# Patient Record
Sex: Female | Born: 1945 | Race: White | Hispanic: No | Smoking: Current every day smoker
Health system: Southern US, Community
[De-identification: ages and names within clinical notes are randomized; demographics above are authoritative.]

## PROBLEM LIST (undated history)

## (undated) DIAGNOSIS — I714 Abdominal aortic aneurysm, without rupture, unspecified: Secondary | ICD-10-CM

## (undated) DIAGNOSIS — E785 Hyperlipidemia, unspecified: Secondary | ICD-10-CM

## (undated) DIAGNOSIS — J439 Emphysema, unspecified: Secondary | ICD-10-CM

## (undated) DIAGNOSIS — G43909 Migraine, unspecified, not intractable, without status migrainosus: Secondary | ICD-10-CM

## (undated) DIAGNOSIS — I2510 Atherosclerotic heart disease of native coronary artery without angina pectoris: Secondary | ICD-10-CM

## (undated) DIAGNOSIS — I10 Essential (primary) hypertension: Secondary | ICD-10-CM

## (undated) HISTORY — DX: Emphysema, unspecified: J43.9

## (undated) HISTORY — DX: Hyperlipidemia, unspecified: E78.5

## (undated) HISTORY — PX: EXTERNAL EAR SURGERY: SHX627

## (undated) HISTORY — PX: TYMPANOSTOMY TUBE PLACEMENT: SHX32

## (undated) HISTORY — PX: UTERINE FIBROID SURGERY: SHX826

## (undated) HISTORY — DX: Atherosclerotic heart disease of native coronary artery without angina pectoris: I25.10

---

## 2002-07-24 DIAGNOSIS — E782 Mixed hyperlipidemia: Secondary | ICD-10-CM | POA: Diagnosis present

## 2002-07-24 DIAGNOSIS — I10 Essential (primary) hypertension: Secondary | ICD-10-CM | POA: Diagnosis present

## 2010-08-19 DIAGNOSIS — K648 Other hemorrhoids: Secondary | ICD-10-CM | POA: Insufficient documentation

## 2010-08-19 DIAGNOSIS — K644 Residual hemorrhoidal skin tags: Secondary | ICD-10-CM | POA: Insufficient documentation

## 2010-11-30 DIAGNOSIS — L509 Urticaria, unspecified: Secondary | ICD-10-CM | POA: Insufficient documentation

## 2011-08-17 DIAGNOSIS — G43909 Migraine, unspecified, not intractable, without status migrainosus: Secondary | ICD-10-CM | POA: Insufficient documentation

## 2017-11-30 DIAGNOSIS — I739 Peripheral vascular disease, unspecified: Secondary | ICD-10-CM | POA: Insufficient documentation

## 2018-03-29 ENCOUNTER — Encounter: Payer: Self-pay | Admitting: Emergency Medicine

## 2018-03-29 ENCOUNTER — Emergency Department: Payer: Medicare Other

## 2018-03-29 ENCOUNTER — Other Ambulatory Visit: Payer: Self-pay

## 2018-03-29 ENCOUNTER — Emergency Department
Admission: EM | Admit: 2018-03-29 | Discharge: 2018-03-29 | Disposition: A | Discharge status: Other Acute Inpatient Hospital | Payer: Medicare Other | Attending: Emergency Medicine | Admitting: Emergency Medicine

## 2018-03-29 DIAGNOSIS — I10 Essential (primary) hypertension: Secondary | ICD-10-CM | POA: Diagnosis not present

## 2018-03-29 DIAGNOSIS — R0789 Other chest pain: Secondary | ICD-10-CM | POA: Diagnosis present

## 2018-03-29 DIAGNOSIS — I7101 Dissection of thoracic aorta: Secondary | ICD-10-CM | POA: Diagnosis not present

## 2018-03-29 DIAGNOSIS — I71019 Dissection of thoracic aorta, unspecified: Secondary | ICD-10-CM

## 2018-03-29 DIAGNOSIS — T148XXA Other injury of unspecified body region, initial encounter: Secondary | ICD-10-CM | POA: Insufficient documentation

## 2018-03-29 HISTORY — DX: Migraine, unspecified, not intractable, without status migrainosus: G43.909

## 2018-03-29 HISTORY — DX: Essential (primary) hypertension: I10

## 2018-03-29 LAB — APTT: aPTT: 39 seconds — ABNORMAL HIGH (ref 24–36)

## 2018-03-29 LAB — CBC
HCT: 44.9 % (ref 36.0–46.0)
HEMOGLOBIN: 14.6 g/dL (ref 12.0–15.0)
MCH: 31 pg (ref 26.0–34.0)
MCHC: 32.5 g/dL (ref 30.0–36.0)
MCV: 95.3 fL (ref 80.0–100.0)
Platelets: 262 10*3/uL (ref 150–400)
RBC: 4.71 MIL/uL (ref 3.87–5.11)
RDW: 13.2 % (ref 11.5–15.5)
WBC: 12.1 10*3/uL — ABNORMAL HIGH (ref 4.0–10.5)
nRBC: 0 % (ref 0.0–0.2)

## 2018-03-29 LAB — POCT I-STAT, CHEM 8
BUN: 28 mg/dL — ABNORMAL HIGH (ref 8–23)
CALCIUM ION: 1.18 mmol/L (ref 1.15–1.40)
Chloride: 104 mmol/L (ref 98–111)
Creatinine, Ser: 0.8 mg/dL (ref 0.44–1.00)
Glucose, Bld: 123 mg/dL — ABNORMAL HIGH (ref 70–99)
HCT: 42 % (ref 36.0–46.0)
Hemoglobin: 14.3 g/dL (ref 12.0–15.0)
Potassium: 3.6 mmol/L (ref 3.5–5.1)
SODIUM: 139 mmol/L (ref 135–145)
TCO2: 31 mmol/L (ref 22–32)

## 2018-03-29 LAB — PROTIME-INR
INR: 0.94
Prothrombin Time: 12.5 seconds (ref 11.4–15.2)

## 2018-03-29 LAB — TROPONIN I

## 2018-03-29 MED ORDER — OXYCODONE HCL 5 MG PO TABS
5.00 | ORAL_TABLET | ORAL | Status: DC
Start: ? — End: 2018-03-29

## 2018-03-29 MED ORDER — OXYCODONE HCL 5 MG PO TABS
10.00 | ORAL_TABLET | ORAL | Status: DC
Start: ? — End: 2018-03-29

## 2018-03-29 MED ORDER — MORPHINE SULFATE (PF) 4 MG/ML IV SOLN
INTRAVENOUS | Status: AC
  Administered 2018-03-29: 4 mg via INTRAVENOUS
  Filled 2018-03-29: qty 1

## 2018-03-29 MED ORDER — MORPHINE SULFATE (PF) 4 MG/ML IV SOLN
4.0000 mg | Freq: Once | INTRAVENOUS | Status: AC
  Administered 2018-03-29: 4 mg via INTRAVENOUS

## 2018-03-29 MED ORDER — NITROPRUSSIDE SODIUM-NACL 10-0.9 MG/50ML-% IV SOLN
0.0000 ug/kg/min | INTRAVENOUS | Status: DC
  Administered 2018-03-29: 0.3 ug/kg/min via INTRAVENOUS
  Filled 2018-03-29: qty 50

## 2018-03-29 MED ORDER — FENTANYL CITRATE (PF) 50 MCG/ML IJ SOLN
50.00 | INTRAMUSCULAR | Status: DC
Start: ? — End: 2018-03-29

## 2018-03-29 MED ORDER — IOHEXOL 350 MG/ML SOLN
75.0000 mL | Freq: Once | INTRAVENOUS | Status: AC | PRN
  Administered 2018-03-29: 75 mL via INTRAVENOUS

## 2018-03-29 MED ORDER — ONDANSETRON HCL 4 MG/2ML IJ SOLN
4.00 | INTRAMUSCULAR | Status: DC
Start: ? — End: 2018-03-29

## 2018-03-29 MED ORDER — DOCUSATE SODIUM 100 MG PO CAPS
100.00 | ORAL_CAPSULE | ORAL | Status: DC
Start: 2018-03-30 — End: 2018-03-29

## 2018-03-29 MED ORDER — ACETAMINOPHEN 325 MG PO TABS
650.00 | ORAL_TABLET | ORAL | Status: DC
Start: ? — End: 2018-03-29

## 2018-03-29 MED ORDER — ONDANSETRON HCL 4 MG/2ML IJ SOLN
INTRAMUSCULAR | Status: AC
  Administered 2018-03-29: 4 mg via INTRAVENOUS
  Filled 2018-03-29: qty 2

## 2018-03-29 MED ORDER — ONDANSETRON HCL 4 MG/2ML IJ SOLN
4.0000 mg | Freq: Once | INTRAMUSCULAR | Status: AC
  Administered 2018-03-29: 4 mg via INTRAVENOUS

## 2018-03-29 MED ORDER — CLEVIDIPINE 25 MG/50ML IV EMUL
0.00 | INTRAVENOUS | Status: DC
Start: ? — End: 2018-03-29

## 2018-03-29 MED ORDER — MORPHINE SULFATE (PF) 4 MG/ML IV SOLN
INTRAVENOUS | Status: AC
  Filled 2018-03-29: qty 1

## 2018-03-29 MED ORDER — ESMOLOL HCL-SODIUM CHLORIDE 2500 MG/250ML IV SOLN
0.00 | INTRAVENOUS | Status: DC
Start: ? — End: 2018-03-29

## 2018-03-29 NOTE — ED Notes (Signed)
Rate dose change of nipride, per parameters due to a blood pressure of 154/78

## 2018-03-29 NOTE — ED Notes (Signed)
[facility] transfer center called to given report to next provider

## 2018-03-29 NOTE — ED Notes (Signed)
Pt signs consent for transfer electronically.

## 2018-03-29 NOTE — ED Notes (Signed)
Rate/dose change of nipride, per parameter due to blood pressure of 154/74

## 2018-03-29 NOTE — ED Notes (Signed)
Rate dose change of nipride due to blood pressure of 163/77

## 2018-03-29 NOTE — ED Notes (Signed)
Rate dose change of nipride, per parameter due to blood pressure of 144/79

## 2018-03-29 NOTE — ED Triage Notes (Signed)
PT arrived via ems from home with complaints of sudden onset of central chest pain that radiates to her back. Pt rates pain at a 5 but appears very uncomfortable in triage. Pt has HX of HTN and took her prescribed HTN medication 30 minutes prior to calling ems. Pt hypertensive in triage 234/97. Pt describes the pain as a constant grinding pain that is increasing with intensity.   Pt was given a full dosage of aspirin prior to arrival.

## 2018-03-29 NOTE — ED Notes (Signed)
EMTALA reviewed. 

## 2018-03-29 NOTE — ED Notes (Addendum)
Rate/dose change of nipride, per MAR parameters. Blood pressure 181/84

## 2018-03-29 NOTE — ED Notes (Signed)
Rate/dose change of nipride, per parameters due to blood pressure of 138/79

## 2018-03-29 NOTE — ED Notes (Signed)
Rate/dose change of nipride, per parameters due to blood pressure of 156/72

## 2018-03-29 NOTE — ED Notes (Signed)
Rate dose change of nipride, per parameter due to blood pressure of 171/81

## 2018-03-29 NOTE — ED Notes (Signed)
Rate dose change of nipride, per parameter due to blood pressure of 169/71

## 2018-03-29 NOTE — ED Notes (Signed)
Rate/dose change of nipride, per parameter due to blood pressure of 164/80

## 2018-03-29 NOTE — ED Notes (Signed)
Rate/dose change of nipride, per parameter due to blood pressure of 161/77

## 2018-03-29 NOTE — ED Notes (Signed)
RN from accepting facility unable to take report at this time. Call back number provided.

## 2018-03-29 NOTE — ED Notes (Signed)
Rate dose change of nipride, per parameter due to blood pressure of 150/68

## 2018-03-29 NOTE — ED Notes (Signed)
[facility] transfer team resumes care and management of nipride at this time

## 2018-03-29 NOTE — ED Notes (Signed)
Rate dose change of nipride, per parameter due to blood pressure of 132/60

## 2018-03-29 NOTE — ED Notes (Signed)
Rate/dose change of nipride, per MAR parameters due to blood pressure of 179/75

## 2018-03-29 NOTE — ED Provider Notes (Signed)
[facility] Emergency Department Provider Note   ____________________________________________    I have reviewed the triage vital signs and the nursing notes.   HISTORY  Chief Complaint Chest Pain     HPI Tiffany Munoz is a 72 y.o. female who presents with relatively abrupt onset of chest pain radiating to her back.  The chest pain is substernal with radiation directly posterior.  She reports it started at 4 AM after she got into bed.  She reports she took aspirin antacids without significant relief although it did seem to improve gradually but then started to get worse.  She called EMS around 815.  Denies nausea vomiting or diaphoresis.  No history of heart disease does have a history of hypertension which is controlled with medications.  No fevers or chills or cough.  No pleurisy.  No recent travel.  No calf pain or swelling.  Past Medical History:  Diagnosis Date  . Hypertension   . Migraine     There are no active problems to display for this patient.     Prior to Admission medications   Not on File     Allergies Wellbutrin [bupropion] No family history of dissection Social History No smoking, occasional alcohol  Review of Systems  Constitutional: No fever/chills Eyes: No visual changes.  ENT: No neck pain Cardiovascular: As above Respiratory: No significant shortness of breath Gastrointestinal: No abdominal pain.  No nausea, no vomiting.   Genitourinary: No incontinence Musculoskeletal: As above, radiation to the back Skin: Negative for rash. Neurological: Negative for headaches   ____________________________________________   PHYSICAL EXAM:  VITAL SIGNS: ED Triage Vitals  Enc Vitals Group     BP 03/29/18 0837 (!) 234/97     Pulse Rate 03/29/18 0837 (!) 51     Resp 03/29/18 0837 15     Temp 03/29/18 0837 97.8 F (36.6 C)     Temp Source 03/29/18 0837 Oral     SpO2 03/29/18 0837 100 %     Weight 03/29/18 0838 48.5  kg (107 lb)     Height 03/29/18 0838 1.651 m (5\' 5" )     Head Circumference --      Peak Flow --      Pain Score 03/29/18 0837 5     Pain Loc --      Pain Edu? --      Excl. in GC? --     Constitutional: Alert and oriented.  Uncomfortable appearing but no acute distress Eyes: Conjunctivae are normal.   Nose: No congestion/rhinnorhea. Mouth/Throat: Mucous membranes are moist.    Cardiovascular: Mild bradycardia, regular rhythm. Grossly normal heart sounds.  Good peripheral circulation. Respiratory: Normal respiratory effort.  No retractions. Lungs CTAB. Gastrointestinal: Soft and nontender. No distention.  No pulsatile mass  Musculoskeletal: No calf pain or swelling.  No edema.  Warm and well perfused Neurologic:  Normal speech and language. No gross focal neurologic deficits are appreciated.  Skin:  Skin is warm, dry and intact. No rash noted. Psychiatric: Mood and affect are normal. Speech and behavior are normal.  ____________________________________________   LABS (all labs ordered are listed, but only abnormal results are displayed)  Labs Reviewed  CBC - Abnormal; Notable for the following components:      Result Value   WBC 12.1 (*)    All other components within normal limits  APTT - Abnormal; Notable for the following components:   aPTT 39 (*)    All other components within normal  limits  POCT I-STAT, CHEM 8 - Abnormal; Notable for the following components:   BUN 28 (*)    Glucose, Bld 123 (*)    All other components within normal limits  TROPONIN I  PROTIME-INR  I-STAT CHEM 8, ED   ____________________________________________  EKG  ED ECG REPORT I, Jene Everyobert Curt Oatis, the attending physician, personally viewed and interpreted this ECG.  Date: 03/29/2018 EKG Time: 8:37 AM Rate: 53 Rhythm: normal sinus rhythm QRS Axis: normal Intervals: normal ST/T Wave abnormalities: Minimal ST depression laterally Narrative Interpretation: Possible ST depression  laterally  ____________________________________________  RADIOLOGY  CT angiography demonstrates acute intramural hematoma ____________________________________________   PROCEDURES  Procedure(s) performed: No  Procedures   Critical Care performed: yes  CRITICAL CARE Performed by: Jene Everyobert Madalyn Legner   Total critical care time: 50 minutes  Critical care time was exclusive of separately billable procedures and treating other patients.  Critical care was necessary to treat or prevent imminent or life-threatening deterioration.  Critical care was time spent personally by me on the following activities: development of treatment plan with patient and/or surrogate as well as nursing, discussions with consultants, evaluation of patient's response to treatment, examination of patient, obtaining history from patient or surrogate, ordering and performing treatments and interventions, ordering and review of laboratory studies, ordering and review of radiographic studies, pulse oximetry and re-evaluation of patient's condition.  ____________________________________________   INITIAL IMPRESSION / ASSESSMENT AND PLAN / ED COURSE  Pertinent labs & imaging results that were available during my care of the patient were reviewed by me and considered in my medical decision making (see chart for details).  Patient presents with relatively abrupt onset of chest pain at 4 AM, radiating to her back which is moderate to severe.  She is markedly hypertensive here in the emergency department.  Significant concern for dissection versus CAD, no pleurisy or cough or travel or calf pain to suggest PE.  EKG overall reassuring however possible minimal ST depression laterally will repeat in 15 to 20 minutes.  Will obtain i-STAT Chem-8 to expedite CT scan.   ----------------------------------------- 9:22 AM on 03/29/2018 -----------------------------------------  CT scan is in progress, on initial nonenhanced  images very suspicious for dissection.  I have ordered second IV, nitroprusside drip  Discussed with radiology who confirms acute intramural hematoma.  ----------------------------------------- 9:43 AM on 03/29/2018 -----------------------------------------  [facility] patient accepted by Dr. Karma GreaserParody.  Questioned specialist whether to add esmolol given that the patient's heart rate is in the low 60s and was told "I will let you make medical decisions at your facility ".  Given that response I will hold off on esmolol unless we see an increase in her heart rate.  Attempting to find the fastest route of transfer for this patient which may require helicopter transfer.    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    Jene EveryKinner, Jojuan Champney, MD 03/29/18 1135

## 2018-03-29 NOTE — ED Notes (Signed)
Rate dose change of nipride per parameters due to blood pressure

## 2018-03-30 MED ORDER — HYDRALAZINE HCL 25 MG PO TABS
50.00 | ORAL_TABLET | ORAL | Status: DC
Start: 2018-03-30 — End: 2018-03-30

## 2018-03-30 MED ORDER — METOPROLOL SUCCINATE ER 50 MG PO TB24
50.00 | ORAL_TABLET | ORAL | Status: DC
Start: 2018-03-31 — End: 2018-03-30

## 2018-03-30 MED ORDER — HYDRALAZINE HCL 20 MG/ML IJ SOLN
10.00 | INTRAMUSCULAR | Status: DC
Start: ? — End: 2018-03-30

## 2018-08-24 DIAGNOSIS — I714 Abdominal aortic aneurysm, without rupture, unspecified: Secondary | ICD-10-CM | POA: Insufficient documentation

## 2018-09-10 ENCOUNTER — Emergency Department
Admission: EM | Admit: 2018-09-10 | Discharge: 2018-09-10 | Disposition: A | Discharge status: Home / Self Care | Payer: Medicare Other | Attending: Emergency Medicine | Admitting: Emergency Medicine

## 2018-09-10 ENCOUNTER — Encounter: Payer: Self-pay | Admitting: Emergency Medicine

## 2018-09-10 ENCOUNTER — Other Ambulatory Visit: Payer: Self-pay

## 2018-09-10 DIAGNOSIS — Z7902 Long term (current) use of antithrombotics/antiplatelets: Secondary | ICD-10-CM | POA: Insufficient documentation

## 2018-09-10 DIAGNOSIS — I10 Essential (primary) hypertension: Secondary | ICD-10-CM | POA: Diagnosis not present

## 2018-09-10 DIAGNOSIS — R55 Syncope and collapse: Secondary | ICD-10-CM | POA: Diagnosis not present

## 2018-09-10 DIAGNOSIS — F17200 Nicotine dependence, unspecified, uncomplicated: Secondary | ICD-10-CM | POA: Diagnosis not present

## 2018-09-10 DIAGNOSIS — Z79899 Other long term (current) drug therapy: Secondary | ICD-10-CM | POA: Diagnosis not present

## 2018-09-10 HISTORY — DX: Abdominal aortic aneurysm, without rupture, unspecified: I71.40

## 2018-09-10 HISTORY — DX: Abdominal aortic aneurysm, without rupture: I71.4

## 2018-09-10 LAB — CBC
HCT: 44 % (ref 36.0–46.0)
Hemoglobin: 14.2 g/dL (ref 12.0–15.0)
MCH: 30.8 pg (ref 26.0–34.0)
MCHC: 32.3 g/dL (ref 30.0–36.0)
MCV: 95.4 fL (ref 80.0–100.0)
Platelets: 291 10*3/uL (ref 150–400)
RBC: 4.61 MIL/uL (ref 3.87–5.11)
RDW: 13 % (ref 11.5–15.5)
WBC: 9.2 10*3/uL (ref 4.0–10.5)
nRBC: 0 % (ref 0.0–0.2)

## 2018-09-10 LAB — URINALYSIS, COMPLETE (UACMP) WITH MICROSCOPIC
Bacteria, UA: NONE SEEN
Bilirubin Urine: NEGATIVE
Glucose, UA: NEGATIVE mg/dL
Hgb urine dipstick: NEGATIVE
Ketones, ur: NEGATIVE mg/dL
Leukocytes,Ua: NEGATIVE
Nitrite: NEGATIVE
Protein, ur: NEGATIVE mg/dL
Specific Gravity, Urine: 1.013 (ref 1.005–1.030)
Squamous Epithelial / HPF: NONE SEEN (ref 0–5)
pH: 5 (ref 5.0–8.0)

## 2018-09-10 LAB — COMPREHENSIVE METABOLIC PANEL
ALT: 24 U/L (ref 0–44)
AST: 23 U/L (ref 15–41)
Albumin: 4.5 g/dL (ref 3.5–5.0)
Alkaline Phosphatase: 144 U/L — ABNORMAL HIGH (ref 38–126)
Anion gap: 9 (ref 5–15)
BUN: 22 mg/dL (ref 8–23)
CO2: 26 mmol/L (ref 22–32)
Calcium: 9.9 mg/dL (ref 8.9–10.3)
Chloride: 105 mmol/L (ref 98–111)
Creatinine, Ser: 0.84 mg/dL (ref 0.44–1.00)
GFR calc Af Amer: >60 mL/min (ref >60)
GFR calc non Af Amer: >60 mL/min (ref >60)
Glucose, Bld: 91 mg/dL (ref 70–99)
Potassium: 4 mmol/L (ref 3.5–5.1)
Sodium: 140 mmol/L (ref 135–145)
Total Bilirubin: 0.5 mg/dL (ref 0.3–1.2)
Total Protein: 8 g/dL (ref 6.5–8.1)

## 2018-09-10 LAB — TROPONIN I: Troponin I: <0.03 ng/mL (ref <0.03)

## 2018-09-10 MED ORDER — SODIUM CHLORIDE 0.9% FLUSH: 3.0000 mL | Freq: Once | INTRAVENOUS | Status: DC

## 2018-09-10 NOTE — ED Notes (Signed)
Pt presents after 2+ episodes of hypotension which resolved PTA. Pt states she was outside doing light chores and became hot, sweaty, and dizzy. Pt no longer has dizziness and her BP is WNL.

## 2018-09-10 NOTE — ED Provider Notes (Signed)
[facility] Emergency Department Provider Note ____________________________________________   First MD Initiated Contact with Patient 09/10/18 1750     (approximate)  I have reviewed the triage vital signs and the nursing notes.   HISTORY  Chief Complaint Dizziness    HPI Tiffany Munoz is a 73 y.o. female with PMH as noted below who presents with lightheadedness and near syncope, acute onset today while she was doing light chores outside, and now resolved.  The patient states that she felt hot, became diaphoretic, and felt lightheaded.  However, she sat down and did not pass out.  She states that she checked her blood pressure and the systolic was 82.  Over approximately the next hour, her symptoms gradually resolved and the blood pressure gradually went back up to normal.  The patient reports that she takes metoprolol and has been compliant with it.  She denies any chest pain, abdominal pain, difficulty breathing, fever, weakness, or any vomiting.  She states that she feels well now.  Past Medical History:  Diagnosis Date  . AAA (abdominal aortic aneurysm) (HCC)   . Hypertension   . Migraine     There are no active problems to display for this patient.   Past Surgical History:  Procedure Laterality Date  . CESAREAN SECTION    . EXTERNAL EAR SURGERY    . TYMPANOSTOMY TUBE PLACEMENT    . UTERINE FIBROID SURGERY      Prior to Admission medications   Medication Sig Start Date End Date Taking? Authorizing Provider  aspirin 81 MG tablet Take 1-2 tablets by mouth daily. 09/19/07   [provider]  cetirizine (ZYRTEC ALLERGY) 10 MG tablet Take 1 tablet by mouth daily. 09/19/07   [provider]  Cholecalciferol (VITAMIN D-1000 MAX ST) 25 MCG (1000 UT) tablet Take 1 tablet by mouth daily.    [provider]  clobetasol cream (TEMOVATE) 0.05 % Apply 1 application topically 2 (two) times daily as needed. 11/30/17 11/30/18  [provider]  clopidogrel (PLAVIX) 75 MG tablet Take 75 mg by mouth daily.    [provider]  ferrous sulfate 325 (65 FE) MG tablet Take 1 tablet by mouth daily.    [provider]  Lactobacillus Rhamnosus, GG, (CULTURELLE) CAPS Take 1 capsule by mouth daily.    [provider]  losartan (COZAAR) 100 MG tablet Take 1 tablet by mouth daily. 10/04/11   [provider]  Melatonin 3 MG TABS Take 3 mg by mouth at bedtime.    [provider]  metoprolol succinate (TOPROL-XL) 50 MG 24 hr tablet Take 1 tablet by mouth daily. 05/30/17   [provider]  potassium chloride (K-DUR,KLOR-CON) 10 MEQ tablet Take 1 tablet by mouth daily. 10/18/17   [provider]  progesterone (PROMETRIUM) 100 MG capsule Take 1 capsule by mouth as needed. 10/04/11   [provider]  rosuvastatin (CRESTOR) 10 MG tablet Take 1 tablet by mouth daily. 01/04/18   [provider]  sodium fluoride (PREVIDENT) 1.1 % GEL dental gel Take 1 application by mouth daily. 05/17/10   [provider]  Testosterone 75 MG PLLT Take 1 tablet by mouth as needed. 03/22/11   [provider]  triamcinolone cream (KENALOG) 0.5 % Apply 1 application topically as needed. 08/17/16   [provider]  vitamin B-12 (CYANOCOBALAMIN) 100 MCG tablet Take 1 tablet by mouth daily.    [provider]    Allergies Wellbutrin [bupropion]  No family  history on file.  Social History Social History   Tobacco Use  . Smoking status: Current Every Day Smoker  . Smokeless tobacco: Never Used  Substance Use Topics  . Alcohol use: Yes  . Drug use: Never    Review of Systems  Constitutional: No fever. Eyes: No redness. ENT: No sore throat. Cardiovascular: Denies chest pain. Respiratory: Denies shortness of breath. Gastrointestinal: No vomiting or diarrhea.  Genitourinary: Negative for dysuria.  Musculoskeletal: Negative for back pain. Skin:  Negative for rash. Neurological: Negative for headache.   ____________________________________________   PHYSICAL EXAM:  VITAL SIGNS: ED Triage Vitals  Enc Vitals Group     BP 09/10/18 1722 119/63     Pulse Rate 09/10/18 1722 71     Resp 09/10/18 1722 18     Temp 09/10/18 1722 98.7 F (37.1 C)     Temp Source 09/10/18 1722 Oral     SpO2 09/10/18 1722 100 %     Weight 09/10/18 1722 106 lb 5 oz (48.2 kg)     Height 09/10/18 1722 5\' 5"  (1.651 m)     Head Circumference --      Peak Flow --      Pain Score 09/10/18 1727 0     Pain Loc --      Pain Edu? --      Excl. in GC? --     Constitutional: Alert and oriented. Well appearing and in no acute distress. Eyes: Conjunctivae are normal.  EOMI.  PERRLA.   Head: Atraumatic. Nose: No congestion/rhinnorhea. Mouth/Throat: Mucous membranes are moist.   Neck: Normal range of motion.  Cardiovascular: Normal rate, regular rhythm. Grossly normal heart sounds.  Good peripheral circulation. Respiratory: Normal respiratory effort.  No retractions. Lungs CTAB. Gastrointestinal: Soft and nontender. No distention.  Genitourinary: No flank tenderness. Musculoskeletal: No lower extremity edema.  No calf or popliteal swelling or tenderness.  Extremities warm and well perfused.  Neurologic:  Normal speech and language. No gross focal neurologic deficits are appreciated.  Skin:  Skin is warm and dry. No rash noted. Psychiatric: Mood and affect are normal. Speech and behavior are normal.  ____________________________________________   LABS (all labs ordered are listed, but only abnormal results are displayed)  Labs Reviewed  URINALYSIS, COMPLETE (UACMP) WITH MICROSCOPIC - Abnormal; Notable for the following components:      Result Value   Color, Urine YELLOW (*)    APPearance CLEAR (*)    All other components within normal limits  COMPREHENSIVE METABOLIC PANEL - Abnormal; Notable for the following components:   Alkaline Phosphatase 144  (*)    All other components within normal limits  CBC  TROPONIN I   ____________________________________________  EKG  ED ECG REPORT I, Dionne BucySebastian Jos Cygan, the attending physician, personally viewed and interpreted this ECG.  Date: 09/10/2018 EKG Time: 1729 Rate: 69 Rhythm: normal sinus rhythm QRS Axis: normal Intervals: normal ST/T Wave abnormalities: normal Narrative Interpretation: no evidence of acute ischemia  ____________________________________________  RADIOLOGY    ____________________________________________   PROCEDURES  Procedure(s) performed: No  Procedures  Critical Care performed: No ____________________________________________   INITIAL IMPRESSION / ASSESSMENT AND PLAN / ED COURSE  Pertinent labs & imaging results that were available during my care of the patient were reviewed by me and considered in my medical decision making (see chart for details).  73 year old female with PMH as noted above presents with an episode of lightheadedness and near syncope this afternoon associated with low blood pressure and occurring after she was  doing some chores outside.  However, the patient states that she was not heavily exerting herself.  She denies chest pain or any associated symptoms.  Her lightheadedness resolved and her blood pressure is now back to normal.  I reviewed the past medical records in Epic.  The patient was seen in the ED in December of last year with chest pain rating to the back, and had a CT showing intramural hematoma and possible type B dissection.  She was transferred to [facility].  She has been medically managed since then and on blood pressure medication.  On exam today, she is well-appearing.  Her vital signs here are normal.  She is currently asymptomatic.  The remainder of the exam is unremarkable.  Overall the presentation is consistent with a vasovagal near syncope, likely exacerbated by the fact that she was outside in the heat.  She  may be mildly dehydrated.  Given the lack of chest pain, tachycardia, syncope, or any abnormal vital signs, there is no evidence for ACS, other cardiac etiology, or any syndrome related to her aorta.  We will obtain basic labs, troponin, and reassess.  Anticipate discharge home.  ----------------------------------------- 6:41 PM on 09/10/2018 -----------------------------------------  Lab work-up is unremarkable.  The patient remains asymptomatic.  Her blood pressure is still normal.  I offered to observe her a bit longer and check a second troponin although my suspicion for ACS is extremely low.  However, the patient feels comfortable and wants to go home now.  She declines further observation or work-up.  I think that this is reasonable.  I gave her thorough return precautions and she expressed understanding.  She will follow-up with her regular doctor.  ____________________________________________   FINAL CLINICAL IMPRESSION(S) / ED DIAGNOSES  Final diagnoses:  Near syncope      NEW MEDICATIONS STARTED DURING THIS VISIT:  New Prescriptions   No medications on file     Note:  This document was prepared using Dragon voice recognition software and may include unintentional dictation errors.    Dionne Bucy, MD 09/10/18 [phone]

## 2018-09-10 NOTE — Discharge Instructions (Addendum)
Return to the ER for new, worsening, or persistent weakness or lightheadedness, feeling like you are going to pass out, low blood pressure readings, or any chest pain, difficulty breathing or other new or worsening symptoms that concern you.  Follow-up with your regular doctor.

## 2018-09-10 NOTE — ED Triage Notes (Signed)
States felt dizzy and hot, states came in and found blood pressure low, syst 80s. Denies chest pain or SOB. States symptoms have resolved.

## 2018-09-10 NOTE — ED Notes (Signed)
Pt left before receiving D/C instructions.

## 2019-11-25 ENCOUNTER — Encounter: Payer: Self-pay | Admitting: Emergency Medicine

## 2019-11-25 ENCOUNTER — Emergency Department
Admission: EM | Admit: 2019-11-25 | Discharge: 2019-11-25 | Disposition: A | Discharge status: Home / Self Care | Payer: Medicare Other | Attending: Emergency Medicine | Admitting: Emergency Medicine

## 2019-11-25 ENCOUNTER — Emergency Department: Payer: Medicare Other

## 2019-11-25 ENCOUNTER — Other Ambulatory Visit: Payer: Self-pay

## 2019-11-25 DIAGNOSIS — Z5321 Procedure and treatment not carried out due to patient leaving prior to being seen by health care provider: Secondary | ICD-10-CM | POA: Diagnosis not present

## 2019-11-25 DIAGNOSIS — R079 Chest pain, unspecified: Secondary | ICD-10-CM | POA: Diagnosis present

## 2019-11-25 LAB — BASIC METABOLIC PANEL
Anion gap: 10 (ref 5–15)
BUN: 16 mg/dL (ref 8–23)
CO2: 29 mmol/L (ref 22–32)
Calcium: 9.7 mg/dL (ref 8.9–10.3)
Chloride: 101 mmol/L (ref 98–111)
Creatinine, Ser: 0.9 mg/dL (ref 0.44–1.00)
GFR calc Af Amer: >60 mL/min (ref >60)
GFR calc non Af Amer: >60 mL/min (ref >60)
Glucose, Bld: 140 mg/dL — ABNORMAL HIGH (ref 70–99)
Potassium: 3.6 mmol/L (ref 3.5–5.1)
Sodium: 140 mmol/L (ref 135–145)

## 2019-11-25 LAB — TROPONIN I (HIGH SENSITIVITY): Troponin I (High Sensitivity): 36 ng/L — ABNORMAL HIGH (ref <18)

## 2019-11-25 LAB — CBC
HCT: 42.7 % (ref 36.0–46.0)
Hemoglobin: 13.9 g/dL (ref 12.0–15.0)
MCH: 29.9 pg (ref 26.0–34.0)
MCHC: 32.6 g/dL (ref 30.0–36.0)
MCV: 91.8 fL (ref 80.0–100.0)
Platelets: 324 10*3/uL (ref 150–400)
RBC: 4.65 MIL/uL (ref 3.87–5.11)
RDW: 13.3 % (ref 11.5–15.5)
WBC: 7.5 10*3/uL (ref 4.0–10.5)
nRBC: 0 % (ref 0.0–0.2)

## 2019-11-25 NOTE — ED Triage Notes (Signed)
Patient to ER for c/o chest pain. Patient reports chest pain began while resting. Denies any shortness of breath.

## 2019-12-30 IMAGING — CT CT ANGIO CHEST
3 of 7 series · 16 of 46 positions shown · IV contrast (omnipaque)
Comparison: None.

CLINICAL DATA: 72-year-old female with history of sudden onset of
central chest pain radiating into the back starting at 4 a.m. today.
Hypertension. Smoker.

EXAM:
CT ANGIOGRAPHY CHEST WITH CONTRAST
TECHNIQUE: Multidetector CT imaging of the chest was performed using the
standard protocol during bolus administration of intravenous
contrast. Multiplanar CT image reconstructions and MIPs were
obtained to evaluate the vascular anatomy.
CONTRAST:  75mL OMNIPAQUE IOHEXOL 350 MG/ML SOLN

[Series 3: axial pre · axial · non-contrast · 0.55mm/px · z∈[+416,+636]mm · 5 of 68 slices shown]
[im 12/68  lung]
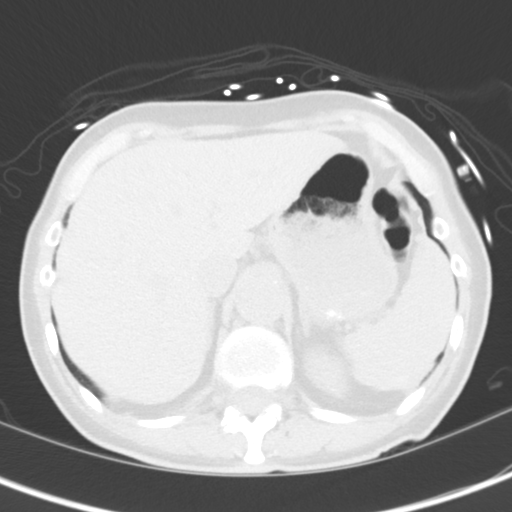
[im 23/68  lung]
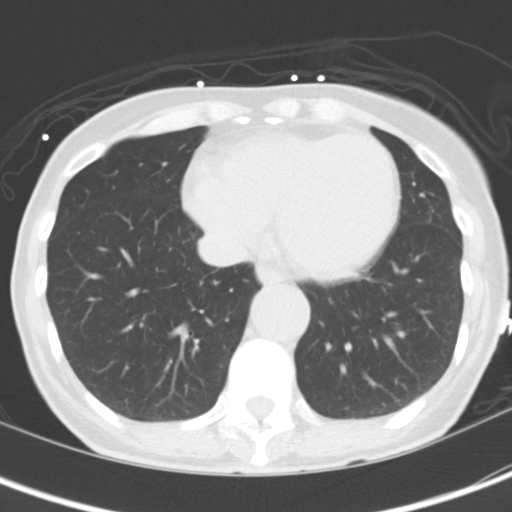
[im 34/68  lung]
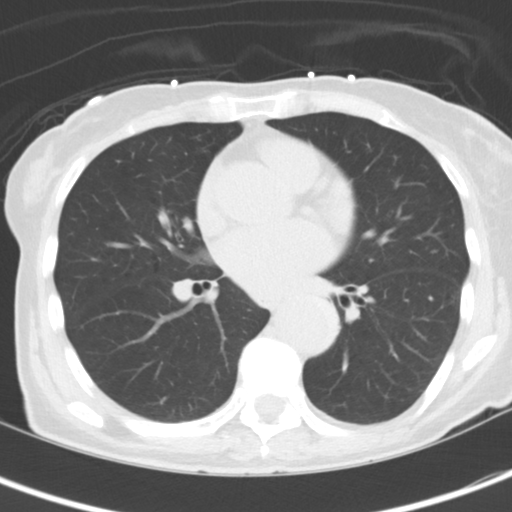
[im 45/68  lung]
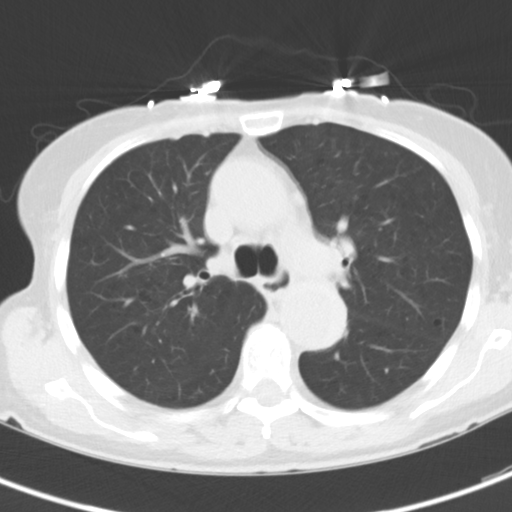
[im 56/68  lung]
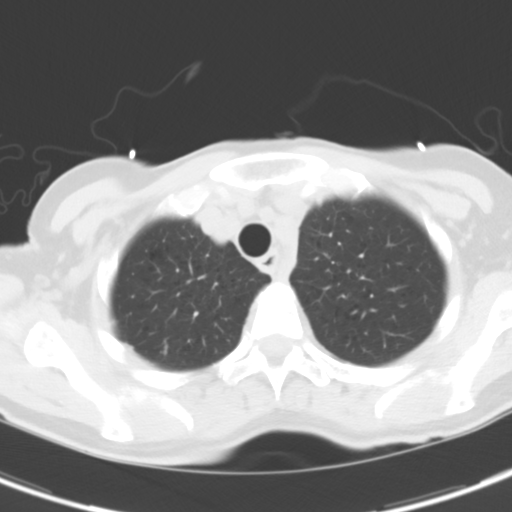

[Series 6: axial arterial · axial · arterial · 0.55mm/px · z∈[+420,+660]mm · 8 of 104 slices shown]
[im 12/104  lung]
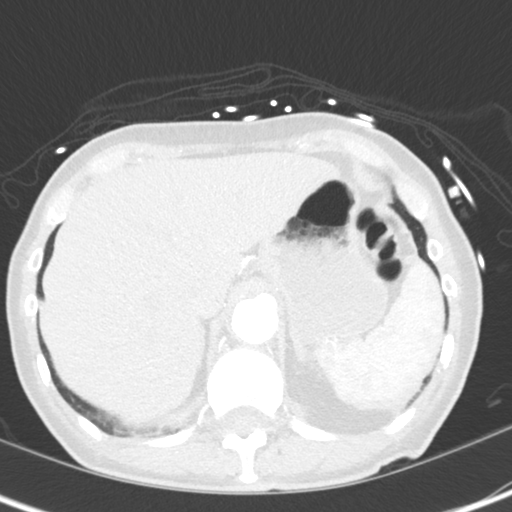
[im 23/104  soft-tissue]
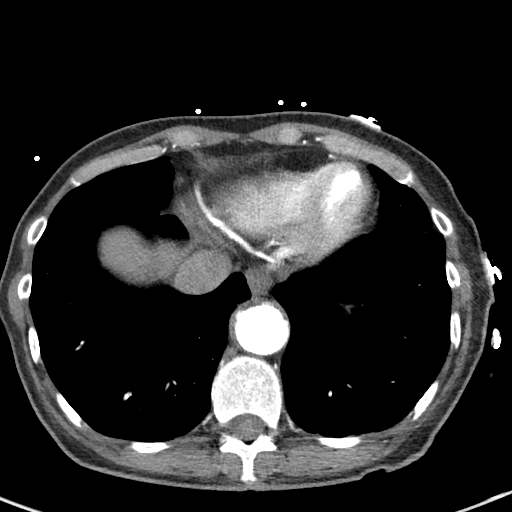
[im 35/104  lung]
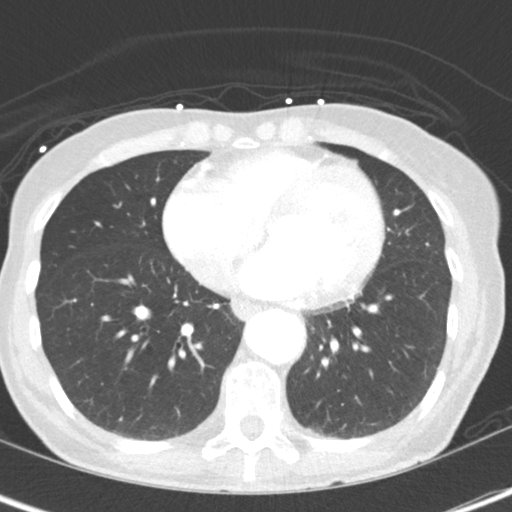
[im 46/104  soft-tissue]
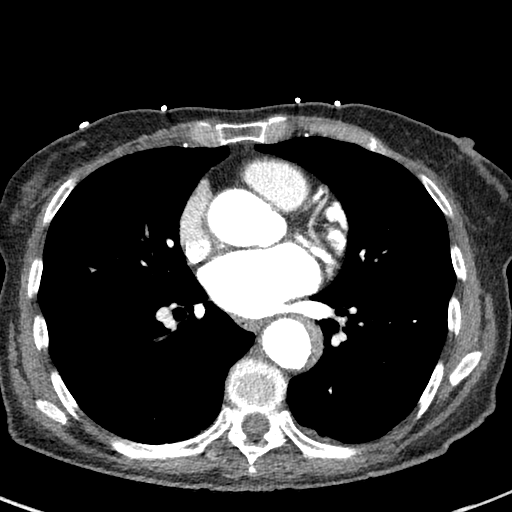
[im 58/104  lung]
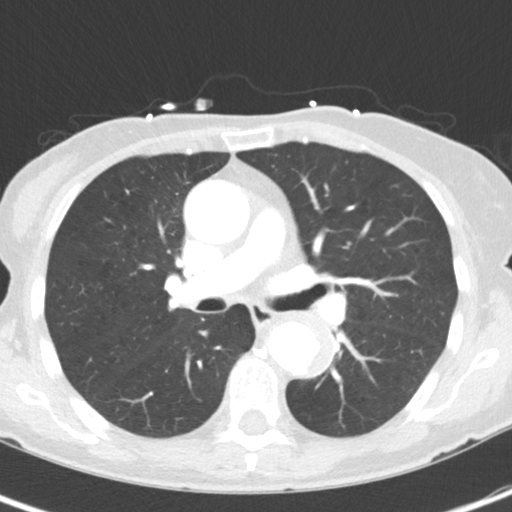
[im 69/104  soft-tissue]
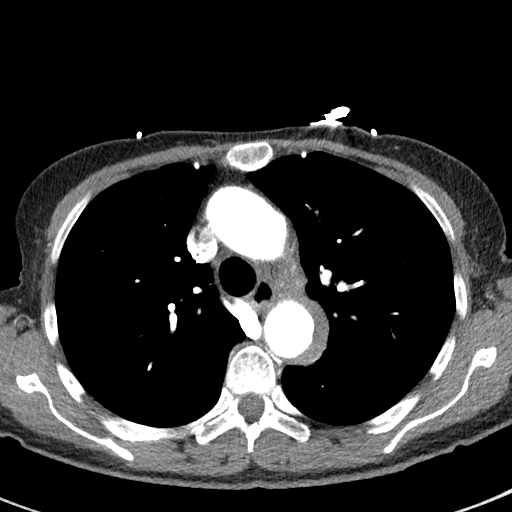
[im 81/104  lung]
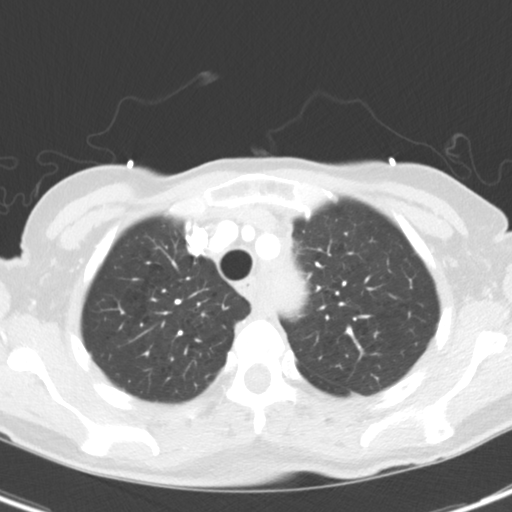
[im 92/104  soft-tissue]
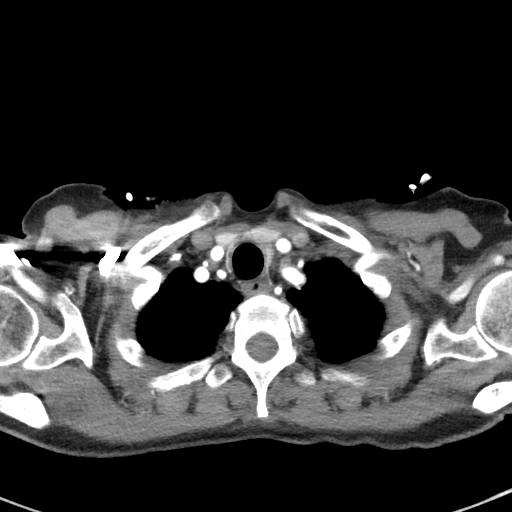

[Series 9: coronals · coronal · 0.55mm/px · 3 of 102 slices shown]
[im 26/102  soft-tissue]
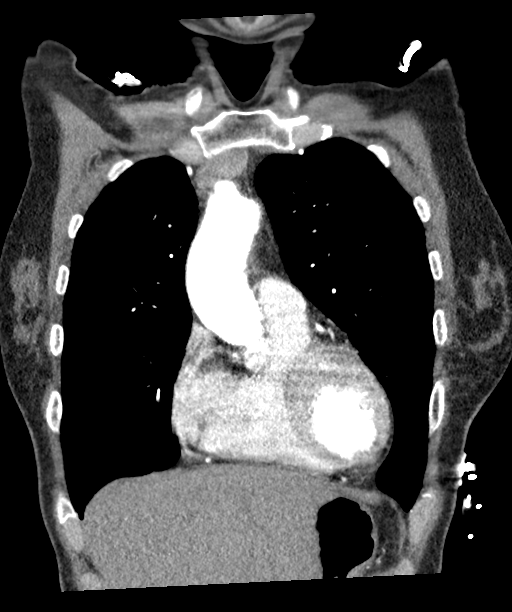
[im 51/102  soft-tissue]
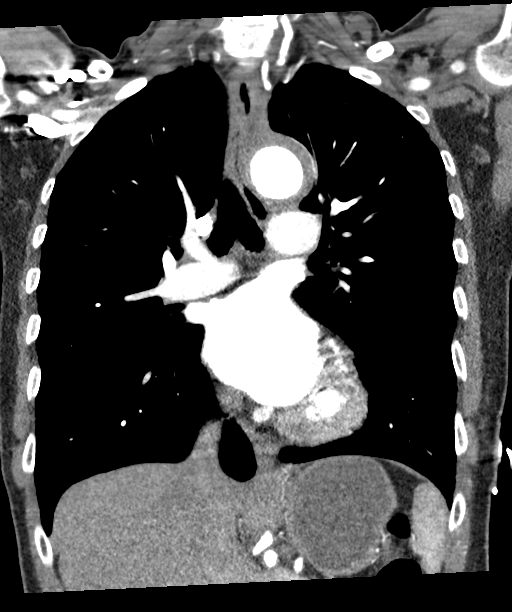
[im 76/102  soft-tissue]
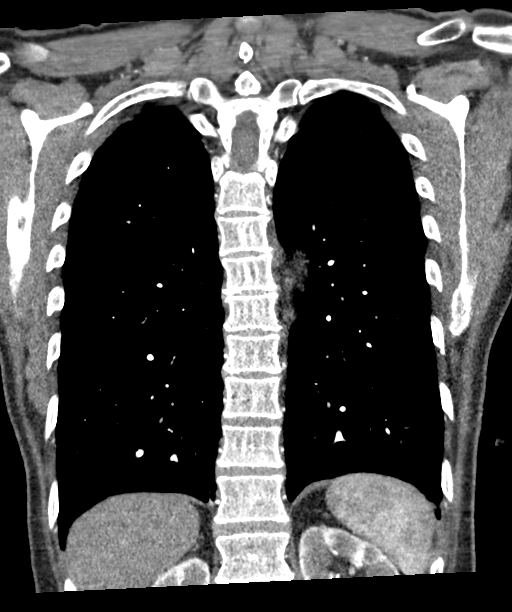

[16 of 46 positions shown; findings below may reference images not displayed]

FINDINGS: Cardiovascular: Heart size is normal. There is no significant
pericardial fluid, thickening or pericardial calcification. Aortic
atherosclerosis. Precontrast images demonstrate a crescentic area of
high attenuation associated with the wall of the thoracic aorta
which extends from the mid aortic arch (immediately distal to the
origin of the left subclavian artery) into the descending thoracic
aorta) terminating shortly below the level of the left pulmonary
vein), compatible with either an acute intramural hematoma or
thrombosed false lumen of a type B dissection. A small outpouching
of contrast extends from the lumen of the aorta into this mural
thickening on axial images 50 and 51, which could represent a small
penetrating ulcer or a fenestration of a dissection flap. A similar
appearing outpouching of contrast extends medially from the mid
aortic arch adjacent to the origin of the left subclavian artery
(axial image 29 of series 6 and coronal image 38 of series 9), also
potentially a small penetrating ulcer. There is no involvement of
the great vessels of the mediastinum at this time, although there is
atheromatous involvement of these great vessels, including a small
ulcerated plaque in the proximal left common carotid artery best
appreciated on sagittal image 63 of series 10. The descending
thoracic aorta is ectatic measuring up to 3.8 x 3.3 cm (mean
diameter of 3.5 cm).

Mediastinum/Nodes: No pathologically enlarged mediastinal or hilar
lymph nodes. No high attenuation fluid collection in the mediastinum
to suggest mediastinal hematoma. Esophagus is unremarkable in
appearance. No axillary lymphadenopathy.

Lungs/Pleura: No acute consolidative airspace disease. No pleural
effusions. No suspicious appearing pulmonary nodules or masses. Mild
diffuse bronchial wall thickening with mild centrilobular and
paraseptal emphysema.

Upper Abdomen: Aortic atherosclerosis.

Musculoskeletal: There are no aggressive appearing lytic or blastic
lesions noted in the visualized portions of the skeleton.

Review of the MIP images confirms the above findings.
IMPRESSION: 1. Study is positive for acute aortic syndrome with findings that
may either reflect an acute intramural hematoma or a thrombosed type
B dissection extending from the mid aortic arch to the mid to distal
descending thoracic aorta, as discussed above. Consultation with
Thoracic Surgery is recommended for further clinical evaluation.
2. No signs of active extravasation at this time.
3. Mild diffuse bronchial wall thickening with mild centrilobular
and paraseptal emphysema.
4. Aortic atherosclerosis.

Critical Value/emergent results were called by telephone at the time
of interpretation on 03/29/2018 at [DATE] to Dr. FASITTE VARGHEESH ,
who verbally acknowledged these results.

Aortic Atherosclerosis ([id number]).

## 2022-07-08 ENCOUNTER — Other Ambulatory Visit: Payer: Self-pay

## 2022-07-08 ENCOUNTER — Emergency Department: Payer: Medicare Other

## 2022-07-08 ENCOUNTER — Encounter: Payer: Self-pay | Admitting: Intensive Care

## 2022-07-08 ENCOUNTER — Inpatient Hospital Stay
Admission: EM | Admit: 2022-07-08 | Discharge: 2022-07-12 | DRG: 322 | Disposition: A | Discharge status: Home / Self Care | Payer: Medicare Other | Attending: Internal Medicine | Admitting: Internal Medicine

## 2022-07-08 DIAGNOSIS — Z7982 Long term (current) use of aspirin: Secondary | ICD-10-CM

## 2022-07-08 DIAGNOSIS — R001 Bradycardia, unspecified: Secondary | ICD-10-CM | POA: Diagnosis not present

## 2022-07-08 DIAGNOSIS — E782 Mixed hyperlipidemia: Secondary | ICD-10-CM | POA: Diagnosis present

## 2022-07-08 DIAGNOSIS — I700 Atherosclerosis of aorta: Secondary | ICD-10-CM | POA: Diagnosis present

## 2022-07-08 DIAGNOSIS — Z716 Tobacco abuse counseling: Secondary | ICD-10-CM | POA: Diagnosis not present

## 2022-07-08 DIAGNOSIS — Z79899 Other long term (current) drug therapy: Secondary | ICD-10-CM

## 2022-07-08 DIAGNOSIS — I129 Hypertensive chronic kidney disease with stage 1 through stage 4 chronic kidney disease, or unspecified chronic kidney disease: Secondary | ICD-10-CM | POA: Diagnosis present

## 2022-07-08 DIAGNOSIS — Z8679 Personal history of other diseases of the circulatory system: Secondary | ICD-10-CM

## 2022-07-08 DIAGNOSIS — I7143 Infrarenal abdominal aortic aneurysm, without rupture: Secondary | ICD-10-CM | POA: Diagnosis present

## 2022-07-08 DIAGNOSIS — I10 Essential (primary) hypertension: Secondary | ICD-10-CM | POA: Diagnosis not present

## 2022-07-08 DIAGNOSIS — F17210 Nicotine dependence, cigarettes, uncomplicated: Secondary | ICD-10-CM | POA: Diagnosis present

## 2022-07-08 DIAGNOSIS — I2510 Atherosclerotic heart disease of native coronary artery without angina pectoris: Secondary | ICD-10-CM | POA: Diagnosis present

## 2022-07-08 DIAGNOSIS — Z888 Allergy status to other drugs, medicaments and biological substances status: Secondary | ICD-10-CM

## 2022-07-08 DIAGNOSIS — I7121 Aneurysm of the ascending aorta, without rupture: Secondary | ICD-10-CM | POA: Diagnosis present

## 2022-07-08 DIAGNOSIS — I214 Non-ST elevation (NSTEMI) myocardial infarction: Principal | ICD-10-CM | POA: Diagnosis present

## 2022-07-08 DIAGNOSIS — J439 Emphysema, unspecified: Secondary | ICD-10-CM | POA: Diagnosis present

## 2022-07-08 DIAGNOSIS — I739 Peripheral vascular disease, unspecified: Secondary | ICD-10-CM | POA: Diagnosis present

## 2022-07-08 DIAGNOSIS — N1831 Chronic kidney disease, stage 3a: Secondary | ICD-10-CM | POA: Diagnosis present

## 2022-07-08 DIAGNOSIS — Z8249 Family history of ischemic heart disease and other diseases of the circulatory system: Secondary | ICD-10-CM

## 2022-07-08 DIAGNOSIS — I719 Aortic aneurysm of unspecified site, without rupture: Secondary | ICD-10-CM | POA: Diagnosis not present

## 2022-07-08 LAB — BASIC METABOLIC PANEL
Anion gap: 9 (ref 5–15)
BUN: 18 mg/dL (ref 8–23)
CO2: 26 mmol/L (ref 22–32)
Calcium: 9.7 mg/dL (ref 8.9–10.3)
Chloride: 104 mmol/L (ref 98–111)
Creatinine, Ser: 0.79 mg/dL (ref 0.44–1.00)
GFR, Estimated: >60 mL/min (ref >60)
Glucose, Bld: 117 mg/dL — ABNORMAL HIGH (ref 70–99)
Potassium: 3.7 mmol/L (ref 3.5–5.1)
Sodium: 139 mmol/L (ref 135–145)

## 2022-07-08 LAB — CBC
HCT: 44.3 % (ref 36.0–46.0)
Hemoglobin: 14.3 g/dL (ref 12.0–15.0)
MCH: 30 pg (ref 26.0–34.0)
MCHC: 32.3 g/dL (ref 30.0–36.0)
MCV: 93.1 fL (ref 80.0–100.0)
Platelets: 278 10*3/uL (ref 150–400)
RBC: 4.76 MIL/uL (ref 3.87–5.11)
RDW: 13.5 % (ref 11.5–15.5)
WBC: 10.5 10*3/uL (ref 4.0–10.5)
nRBC: 0 % (ref 0.0–0.2)

## 2022-07-08 LAB — TROPONIN I (HIGH SENSITIVITY)
Troponin I (High Sensitivity): 1682 ng/L (ref <18)
Troponin I (High Sensitivity): 2096 ng/L (ref <18)
Troponin I (High Sensitivity): 3195 ng/L (ref <18)

## 2022-07-08 LAB — PROTIME-INR
INR: 1.2 (ref 0.8–1.2)
Prothrombin Time: 14.6 seconds (ref 11.4–15.2)

## 2022-07-08 LAB — APTT: aPTT: 34 seconds (ref 24–36)

## 2022-07-08 MED ORDER — ROSUVASTATIN CALCIUM 10 MG PO TABS
20.0000 mg | ORAL_TABLET | Freq: Every day | ORAL | Status: DC
  Administered 2022-07-09: 20 mg via ORAL
  Filled 2022-07-08: qty 2

## 2022-07-08 MED ORDER — HEPARIN (PORCINE) 25000 UT/250ML-% IV SOLN
900.0000 [IU]/h | INTRAVENOUS | Status: DC
  Administered 2022-07-08: 600 [IU]/h via INTRAVENOUS
  Administered 2022-07-09 – 2022-07-10 (×2): 900 [IU]/h via INTRAVENOUS
  Filled 2022-07-08 (×3): qty 250

## 2022-07-08 MED ORDER — HEPARIN BOLUS VIA INFUSION
3000.0000 [IU] | Freq: Once | INTRAVENOUS | Status: AC
  Administered 2022-07-08: 3000 [IU] via INTRAVENOUS
  Filled 2022-07-08: qty 3000

## 2022-07-08 MED ORDER — TRAMADOL HCL 50 MG PO TABS
50.0000 mg | ORAL_TABLET | Freq: Three times a day (TID) | ORAL | Status: DC | PRN
  Administered 2022-07-11: 50 mg via ORAL

## 2022-07-08 MED ORDER — IOHEXOL 350 MG/ML SOLN
75.0000 mL | Freq: Once | INTRAVENOUS | Status: AC | PRN
  Administered 2022-07-08: 75 mL via INTRAVENOUS

## 2022-07-08 MED ORDER — ONDANSETRON HCL 4 MG/2ML IJ SOLN
4.0000 mg | Freq: Four times a day (QID) | INTRAMUSCULAR | Status: DC | PRN
  Administered 2022-07-11: 4 mg via INTRAVENOUS
  Filled 2022-07-08: qty 2

## 2022-07-08 MED ORDER — NITROGLYCERIN 0.4 MG SL SUBL: 0.4000 mg | SUBLINGUAL_TABLET | SUBLINGUAL | Status: DC | PRN

## 2022-07-08 MED ORDER — ASPIRIN 81 MG PO TBEC
81.0000 mg | DELAYED_RELEASE_TABLET | Freq: Every day | ORAL | Status: DC
  Administered 2022-07-09 – 2022-07-12 (×3): 81 mg via ORAL
  Filled 2022-07-08 (×3): qty 1

## 2022-07-08 MED ORDER — ASPIRIN 300 MG RE SUPP: 300.0000 mg | RECTAL | Status: DC

## 2022-07-08 MED ORDER — ASPIRIN 81 MG PO CHEW: 324.0000 mg | CHEWABLE_TABLET | ORAL | Status: DC

## 2022-07-08 MED ORDER — ACETAMINOPHEN 325 MG PO TABS: 650.0000 mg | ORAL_TABLET | ORAL | Status: DC | PRN

## 2022-07-08 NOTE — Assessment & Plan Note (Signed)
Patient presenting with 2-day history of intermittent waxing and waning chest pain with worsening symptoms today, including exertional chest pain and diaphoresis.  Troponin elevated in the 1600s.  Patient's risk factors include hypertension, hyperlipidemia, and ongoing tobacco use.  - Cardiology consulted; appreciate their recommendations - N.p.o. after midnight incase left heart cath is scheduled - Heparin per pharmacy dosing - S/p aspirin 325 mg once - Continue aspirin 81 mg tomorrow - Increase home Crestor to 20 mg daily - Lipid panel pending - Echocardiogram ordered - Tobacco cessation encouraged

## 2022-07-08 NOTE — ED Provider Notes (Signed)
[facility] Provider Note    Event Date/Time   First MD Initiated Contact with Patient 07/08/22 1604     (approximate)   History   Chest Pain   HPI  Tiffany Munoz is a 77 y.o. female with a history of aortic aneurysm, multiple aortic ulcers followed by [facility] vascular who presents with complaints of chest pain.  Patient reports she has had chest pain for several days, seem to worsen today.  She did have some radiation to her back.     Physical Exam   Triage Vital Signs: ED Triage Vitals  Enc Vitals Group     BP 07/08/22 1506 125/66     Pulse Rate 07/08/22 1506 64     Resp 07/08/22 1506 16     Temp 07/08/22 1506 (!) 97.5 F (36.4 C)     Temp Source 07/08/22 1506 Oral     SpO2 07/08/22 1506 95 %     Weight 07/08/22 1509 49.4 kg (109 lb)     Height 07/08/22 1509 1.651 m (5\' 5" )     Head Circumference --      Peak Flow --      Pain Score 07/08/22 1509 3     Pain Loc --      Pain Edu? --      Excl. in [place]? --     Most recent vital signs: Vitals:   07/08/22 1506  BP: 125/66  Pulse: 64  Resp: 16  Temp: (!) 97.5 F (36.4 C)  SpO2: 95%     General: Awake, no distress.  CV:  Good peripheral perfusion.  Regular rate and rhythm, no tachycardia Resp:  Normal effort.  Clear to auscultation bilaterally Abd:  No distention.  Soft, nontender Other:  Equal pulses bilaterally   ED Results / Procedures / Treatments   Labs (all labs ordered are listed, but only abnormal results are displayed) Labs Reviewed  BASIC METABOLIC PANEL - Abnormal; Notable for the following components:      Result Value   Glucose, Bld 117 (*)    All other components within normal limits  TROPONIN I (HIGH SENSITIVITY) - Abnormal; Notable for the following components:   Troponin I (High Sensitivity) 1,682 (*)    All other components within normal limits  CBC  APTT  PROTIME-INR  CBC  HEPARIN LEVEL (UNFRACTIONATED)     EKG  ED ECG REPORT I, Lavonia Drafts, the  attending physician, personally viewed and interpreted this ECG.  Date: 07/08/2022  Rhythm: normal sinus rhythm QRS Axis: normal Intervals: normal ST/T Wave abnormalities: normal Narrative Interpretation: no evidence of acute ischemia    RADIOLOGY Chest x-ray viewed interpret by me, no acute abnormality    PROCEDURES:  Critical Care performed: yes  CRITICAL CARE Performed by: Lavonia Drafts   Total critical care time: 30 minutes  Critical care time was exclusive of separately billable procedures and treating other patients.  Critical care was necessary to treat or prevent imminent or life-threatening deterioration.  Critical care was time spent personally by me on the following activities: development of treatment plan with patient and/or surrogate as well as nursing, discussions with consultants, evaluation of patient's response to treatment, examination of patient, obtaining history from patient or surrogate, ordering and performing treatments and interventions, ordering and review of laboratory studies, ordering and review of radiographic studies, pulse oximetry and re-evaluation of patient's condition.   Procedures   MEDICATIONS ORDERED IN ED: Medications  heparin bolus via infusion 3,000 Units (  3,000 Units Intravenous Bolus from Bag 07/08/22 1726)    Followed by  heparin ADULT infusion 100 units/mL (25000 units/248mL) (600 Units/hr Intravenous New Bag/Given 07/08/22 1728)  iohexol (OMNIPAQUE) 350 MG/ML injection 75 mL (75 mLs Intravenous Contrast Given 07/08/22 1629)     IMPRESSION / MDM / ASSESSMENT AND PLAN / ED COURSE  I reviewed the triage vital signs and the nursing notes. Patient's presentation is most consistent with acute presentation with potential threat to life or bodily function.  Patient with history of aortic aneurysm, aortic ulcers presents with chest pain rating to the back.  Differential includes aortic dissection, NSTEMI, pneumonia,  pneumothorax  Chest x-ray is reassuring, not consistent with pneumonia or pneumothorax,  Notified of elevated troponin of 1600, sending to CT angiography to rule out dissection  CTA is negative for dissection.  Discussed with Dr. Rockey Situ of cardiology who recommends starting heparin drip for likely NSTEMI  Have discussed with the hospitalist for admission        FINAL CLINICAL IMPRESSION(S) / ED DIAGNOSES   Final diagnoses:  NSTEMI (non-ST elevated myocardial infarction) [facility])     Rx / DC Orders   ED Discharge Orders     None        Note:  This document was prepared using Dragon voice recognition software and may include unintentional dictation errors.   Lavonia Drafts, MD 07/08/22 [phone]

## 2022-07-08 NOTE — Assessment & Plan Note (Signed)
-   Increase home Crestor to 20 mg daily - Lipid panel pending

## 2022-07-08 NOTE — ED Triage Notes (Signed)
Patient arrived by EMS from home with c/o chest discomfort X3 days. Radiates into back. Patient took 324mg  aspirin before EMS arrival  History hypertension and aortic bruising December 2019  EMS vitals: 108/59 b/p 65HR 16RR 99% RA

## 2022-07-08 NOTE — Assessment & Plan Note (Signed)
-   Holding home amlodipine

## 2022-07-08 NOTE — Consult Note (Signed)
ANTICOAGULATION CONSULT NOTE  Pharmacy Consult for IV Heparin Indication: chest pain/ACS  Patient Measurements: Height: 5\' 5"  (165.1 cm) Weight: 49.4 kg (109 lb) IBW/kg (Calculated) : 57 Heparin Dosing Weight: 49.4 kg  Labs: Recent Labs    07/08/22 1512  HGB 14.3  HCT 44.3  PLT 278  CREATININE 0.79  TROPONINIHS 1,682*   Estimated Creatinine Clearance: 48.1 mL/min (by C-G formula based on SCr of 0.79 mg/dL).  Medical History: Past Medical History:  Diagnosis Date   AAA (abdominal aortic aneurysm) (HCC)    Hypertension    Migraine    Medications:  No anticoagulation prior to admission per my chart review. Medication reconciliation is pending  Assessment: 77 y/o F with medical history as above presenting to the ED 3/29 with chest discomfort x 3 days radiating to back. Pharmacy consulted to initiate and manage heparin infusion for suspected ACS.  Baseline aPTT and PT-INR are pending. Baseline CBC within normal limits.  Goal of Therapy:  Heparin level 0.3-0.7 units/ml Monitor platelets by anticoagulation protocol: Yes   Plan:  --Heparin 3000 unit IV bolus followed by continuous infusion at 600 units/hr --Heparin level 8 hours from initiation of infusion --Daily CBC per protocol while on IV heparin  Benita Gutter 07/08/2022,5:09 PM

## 2022-07-08 NOTE — Assessment & Plan Note (Signed)
Patient has a history of penetrating atherosclerotic ulcers, following with vascular surgery at [facility].  CTA of the aorta today without any acute changes.

## 2022-07-08 NOTE — H&P (Signed)
History and Physical    Patient: Tiffany Munoz [id number] DOB: 1945-12-28 DOA: 07/08/2022 DOS: the patient was seen and examined on 07/08/2022 PCP: Juluis Pitch, MD  Patient coming from: Home  Chief Complaint:  Chief Complaint  Patient presents with   Chest Pain   HPI: Tiffany Munoz is a 77 y.o. 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  Ms. Locklear states that on the evening of 07/06/2022, she developed chest pain when she was resting.  She states that it felt pressure-like and more of a discomfort rather than pain that radiated to her back.  Pain was waxing and waning; she noticed today that exertion made her chest pain worse.  She initially did not experience any other symptoms of the chest pain, however starting today, she noticed diaphoresis would occur with the chest pain.  She denies any recent illness including fever, chills, nausea, vomiting, diarrhea, abdominal pain, cough, shortness of breath.  ED course: On arrival to the ED, patient was normotensive at 125/66 with heart rate of 64.  She was saturating at 95% on room air.  She was afebrile at 97.5.  Initial workup notable for normal CBC, BMP with potassium 3.7, glucose 117, creatinine 0.79 with GFR above 60.  Initial troponin elevated at 1682.EKG was obtained did not demonstrate any findings concerning for STEMI.  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  Cardiology consulted.  Heparin initiated per pharmacy dosing.  TRH contacted for admission.  Review of Systems: As mentioned in the history of present illness. All other systems reviewed and are negative.  Past  Medical History:  Diagnosis Date   AAA (abdominal aortic aneurysm) ([place])    Hypertension    Migraine    Past Surgical History:  Procedure Laterality Date   CESAREAN SECTION     EXTERNAL EAR SURGERY     TYMPANOSTOMY TUBE PLACEMENT     UTERINE FIBROID SURGERY     Social History:  reports that she has been smoking cigarettes. She has never used smokeless tobacco. She reports current alcohol use of about 2.0 standard drinks of alcohol per week. She reports that she does not use drugs.  Allergies  Allergen Reactions   Wellbutrin [Bupropion]     hives    History reviewed. No pertinent family history.  Prior to Admission medications   Medication Sig Start Date End Date Taking? Authorizing Provider  amLODipine (NORVASC) 10 MG tablet Take 10 mg by mouth daily.   Yes [provider]  aspirin 81 MG tablet Take 1-2 tablets by mouth daily. 09/19/07  Yes [provider]  cetirizine (ZYRTEC ALLERGY) 10 MG tablet Take 1 tablet by mouth daily as needed for rhinitis or allergies. 09/19/07  Yes [provider]  clobetasol cream (TEMOVATE) [id number] % Apply 1 Application topically 3 (three) times daily as needed. 05/10/22  Yes [provider]  potassium chloride (K-DUR,KLOR-CON) 10 MEQ tablet Take 1 tablet by mouth daily. 10/18/17  Yes [provider]  rosuvastatin (CRESTOR) 10 MG tablet Take 1 tablet by mouth every other day. Taken on Mon, Wed and Fri 01/04/18  Yes [provider]  traMADol (ULTRAM) 50 MG tablet Take 50 mg by mouth every 8 (eight)  hours as needed for severe pain or moderate pain.   Yes [provider]  sodium fluoride (PREVIDENT) 1.1 % GEL dental gel Take 1 application by mouth daily. 05/17/10   [provider]    Physical Exam: Vitals:   07/08/22 1506 07/08/22 1509 07/08/22 1615  BP: 125/66  (!) 145/64  Pulse: 64  61  Resp: 16  17  Temp: (!) 97.5 F (36.4 C)    TempSrc: Oral    SpO2: 95%  100%  Weight:  49.4 kg    Height:  5\' 5"  (1.651 m)    Physical Exam Vitals and nursing note reviewed.  Constitutional:      General: She is not in acute distress.    Appearance: She is normal weight. She is not toxic-appearing.  HENT:     Head: Normocephalic and atraumatic.     Mouth/Throat:     Mouth: Mucous membranes are moist.     Pharynx: Oropharynx is clear.  Eyes:     Extraocular Movements: Extraocular movements intact.     Pupils: Pupils are equal, round, and reactive to light.  Neck:     Vascular: No JVD.  Cardiovascular:     Rate and Rhythm: Normal rate and regular rhythm.     Heart sounds: No murmur heard. Pulmonary:     Effort: Pulmonary effort is normal. No tachypnea or respiratory distress.     Breath sounds: Normal breath sounds.  Abdominal:     General: Bowel sounds are normal. There is no distension.     Palpations: Abdomen is soft.     Tenderness: There is no abdominal tenderness. There is no guarding.  Musculoskeletal:     Cervical back: Neck supple.     Right lower leg: No edema.     Left lower leg: No edema.  Skin:    General: Skin is warm and dry.  Neurological:     General: No focal deficit present.     Mental Status: She is alert and oriented to person, place, and time. Mental status is at baseline.  Psychiatric:        Mood and Affect: Mood normal.        Behavior: Behavior normal.    Data Reviewed: CBC with WBC of 10.5, hemoglobin of 14.3 and platelets of 278 BMP with sodium of 139, potassium 3.7, bicarb 26, glucose 117, BUN 18, creatinine 0.79 and GFR above 60 Initial troponin elevated at 1682 INR within normal limits at 1.2 PTT within normal limits at 34  EKG personally reviewed.  Sinus rhythm with rate of 64.  No ST or T wave changes consistent with acute ischemia.  J-point elevation noted in lateral leads though.  CT Angio Chest Aorta W and/or Wo Contrast  Result Date: 07/08/2022 CLINICAL DATA:  Acute aortic syndrome suspected, history of intramural hematoma  EXAM: CT ANGIOGRAPHY CHEST WITH CONTRAST TECHNIQUE: Multidetector CT imaging of the chest was performed using the standard protocol during bolus administration of intravenous contrast. Multiplanar CT image reconstructions and MIPs were obtained to evaluate the vascular anatomy. RADIATION DOSE REDUCTION: This exam was performed according to the departmental dose-optimization program which includes automated exposure control, adjustment of the mA and/or kV according to patient size and/or use of iterative reconstruction technique. CONTRAST:  35mL OMNIPAQUE IOHEXOL 350 MG/ML SOLN COMPARISON:  03/29/2018 FINDINGS: Cardiovascular: Preferential opacification of the thoracic aorta somewhat limited by early contrast bolus and mixing artifact seen in the descending thoracic aorta. Severe, mixed aortic atherosclerosis with irregular thrombus  throughout the aorta. 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 Normal heart size. No pericardial effusion. Mediastinum/Nodes: No enlarged mediastinal, hilar, or axillary lymph nodes. Thyroid gland, trachea, and esophagus demonstrate no significant findings. Lungs/Pleura: Mild centrilobular emphysema. No pleural effusion or pneumothorax. Upper Abdomen: No acute abnormality. Musculoskeletal: No chest wall abnormality. No acute osseous findings. Review of the MIP images confirms the above findings. IMPRESSION: 1. Preferential opacification of the thoracic aorta somewhat limited by early contrast bolus and mixing artifact seen in the descending thoracic aorta. No evident acute dissection or other acute aortic pathology. Noncontrast CT phase was not performed, which somewhat limits evaluation for intramural hematoma. 2. Severe, mixed aortic atherosclerosis with irregular  thrombus throughout the vessel. 3. 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 Recommend vascular consultation if not already obtained and follow-up imaging in 6 months. 4. Emphysema. Aortic Atherosclerosis (ICD10-I70.0) and Emphysema (ICD10-J43.9). Electronically Signed   By: Delanna Ahmadi M.D.   On: 07/08/2022 16:51   DG Chest 2 View  Result Date: 07/08/2022 CLINICAL DATA:  Chest pain. EXAM: CHEST - 2 VIEW COMPARISON:  November 24, 2019. FINDINGS: Stable cardiomediastinal silhouette. Both lungs are clear. The visualized skeletal structures are unremarkable. IMPRESSION: No active cardiopulmonary disease. Electronically Signed   By: Marijo Conception M.D.   On: 07/08/2022 15:38    There are no new results to review at this time.  Assessment and Plan:  * NSTEMI (non-ST elevated myocardial infarction) [facility]) Patient presenting with 2-day history of intermittent waxing and waning chest pain with worsening symptoms today, including exertional chest pain and diaphoresis.  Troponin elevated in the 1600s.  Patient's risk factors include hypertension, hyperlipidemia, and ongoing tobacco use.  - Cardiology consulted; appreciate their recommendations - N.p.o. after midnight incase left heart cath is scheduled - Heparin per pharmacy dosing - S/p aspirin 325 mg once - Continue aspirin 81 mg tomorrow - Increase home Crestor to 20 mg daily - Lipid panel pending - Echocardiogram ordered - Tobacco cessation encouraged  Penetrating atherosclerotic ulcer of aorta (HCC) Patient has a history of penetrating atherosclerotic ulcers, following with vascular surgery at [facility].  CTA of the aorta today without any acute changes.  Benign essential hypertension - Holding home amlodipine  Mixed hyperlipidemia - Increase  home Crestor to 20 mg daily - Lipid panel pending  Advance Care Planning:   Code Status: Full Code verified by patient  Consults: Cardiology  Family Communication: Patient's significant other updated at bedside  Severity of Illness: The appropriate patient status for this patient is INPATIENT. Inpatient status is judged to be reasonable and necessary in order to provide the required intensity of service to ensure the patient's safety. The patient's presenting symptoms, physical exam findings, and initial radiographic and laboratory data in the context of their chronic comorbidities is felt to place them at high risk for further clinical deterioration. Furthermore, it is not anticipated that the patient will be medically stable for discharge from the hospital within 2 midnights of admission.   * I certify that at the point of admission it is my clinical judgment that the  patient will require inpatient hospital care spanning beyond 2 midnights from the point of admission due to high intensity of service, high risk for further deterioration and high frequency of surveillance required.*  Author: Jose Persia, MD 07/08/2022 7:42 PM  For on call review [email or web address].

## 2022-07-09 ENCOUNTER — Encounter: Payer: Self-pay | Admitting: Internal Medicine

## 2022-07-09 ENCOUNTER — Inpatient Hospital Stay (HOSPITAL_COMMUNITY)
Admit: 2022-07-09 | Discharge: 2022-07-09 | Disposition: A | Discharge status: Home / Self Care | Payer: Medicare Other | Attending: Internal Medicine | Admitting: Internal Medicine

## 2022-07-09 DIAGNOSIS — I10 Essential (primary) hypertension: Secondary | ICD-10-CM

## 2022-07-09 DIAGNOSIS — I214 Non-ST elevation (NSTEMI) myocardial infarction: Secondary | ICD-10-CM | POA: Diagnosis not present

## 2022-07-09 LAB — ECHOCARDIOGRAM COMPLETE
AR max vel: 2.45 cm2
AV Peak grad: 9.5 mmHg
Ao pk vel: 1.54 m/s
Area-P 1/2: 2.77 cm2
Calc EF: 66.1 %
Height: 65 in
S' Lateral: 3.1 cm
Single Plane A2C EF: 73.2 %
Single Plane A4C EF: 59.7 %
Weight: 1744 oz

## 2022-07-09 LAB — LIPID PANEL
Cholesterol: 212 mg/dL — ABNORMAL HIGH (ref 0–200)
HDL: 67 mg/dL (ref >40)
LDL Cholesterol: 128 mg/dL — ABNORMAL HIGH (ref 0–99)
Total CHOL/HDL Ratio: 3.2 RATIO
Triglycerides: 83 mg/dL (ref <150)
VLDL: 17 mg/dL (ref 0–40)

## 2022-07-09 LAB — HEPARIN LEVEL (UNFRACTIONATED)
Heparin Unfractionated: 0.16 IU/mL — ABNORMAL LOW (ref 0.30–0.70)
Heparin Unfractionated: 0.27 IU/mL — ABNORMAL LOW (ref 0.30–0.70)
Heparin Unfractionated: 0.56 IU/mL (ref 0.30–0.70)

## 2022-07-09 LAB — BASIC METABOLIC PANEL
Anion gap: 10 (ref 5–15)
BUN: 19 mg/dL (ref 8–23)
CO2: 25 mmol/L (ref 22–32)
Calcium: 9.3 mg/dL (ref 8.9–10.3)
Chloride: 106 mmol/L (ref 98–111)
Creatinine, Ser: 0.77 mg/dL (ref 0.44–1.00)
GFR, Estimated: >60 mL/min (ref >60)
Glucose, Bld: 87 mg/dL (ref 70–99)
Potassium: 3.6 mmol/L (ref 3.5–5.1)
Sodium: 141 mmol/L (ref 135–145)

## 2022-07-09 LAB — CBC
HCT: 44 % (ref 36.0–46.0)
Hemoglobin: 14.5 g/dL (ref 12.0–15.0)
MCH: 30 pg (ref 26.0–34.0)
MCHC: 33 g/dL (ref 30.0–36.0)
MCV: 90.9 fL (ref 80.0–100.0)
Platelets: 285 10*3/uL (ref 150–400)
RBC: 4.84 MIL/uL (ref 3.87–5.11)
RDW: 13.6 % (ref 11.5–15.5)
WBC: 9.6 10*3/uL (ref 4.0–10.5)
nRBC: 0 % (ref 0.0–0.2)

## 2022-07-09 MED ORDER — HEPARIN BOLUS VIA INFUSION
1500.0000 [IU] | Freq: Once | INTRAVENOUS | Status: AC
  Administered 2022-07-09: 1500 [IU] via INTRAVENOUS
  Filled 2022-07-09: qty 1500

## 2022-07-09 MED ORDER — ROSUVASTATIN CALCIUM 10 MG PO TABS
40.0000 mg | ORAL_TABLET | Freq: Every day | ORAL | Status: DC
  Administered 2022-07-10 – 2022-07-12 (×2): 40 mg via ORAL
  Filled 2022-07-09 (×2): qty 4

## 2022-07-09 MED ORDER — AMLODIPINE BESYLATE 5 MG PO TABS
5.0000 mg | ORAL_TABLET | Freq: Every evening | ORAL | Status: DC
  Administered 2022-07-09 – 2022-07-11 (×3): 5 mg via ORAL
  Filled 2022-07-09 (×3): qty 1

## 2022-07-09 NOTE — Plan of Care (Signed)
  Problem: Education: Goal: Understanding of cardiac disease, CV risk reduction, and recovery process will improve Outcome: Progressing Goal: Individualized Educational Video(s) Outcome: Progressing   Problem: Activity: Goal: Ability to tolerate increased activity will improve Outcome: Progressing   Problem: Cardiac: Goal: Ability to achieve and maintain adequate cardiovascular perfusion will improve Outcome: Progressing   

## 2022-07-09 NOTE — Progress Notes (Signed)
Progress Note   Patient: Tiffany Munoz [id number] DOB: 1945/10/09 DOA: 07/08/2022     1 DOS: the patient was seen and examined on 07/09/2022    Subjective:  Patient seen and examined at bedside this morning She admits to improvement in her chest pain Denies nausea vomiting or abdominal pain  Brief hospital course: Tiffany Munoz is a 77 y.o. 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   [name] states that on the evening of 07/06/2022, she developed chest pain when she was resting.  She states that it felt pressure-like and more of a discomfort rather than pain that radiated to her back.  Pain was waxing and waning; she noticed today that exertion made her chest pain worse.    On arrival to the ED, patient was normotensive at 125/66 with heart rate of 64.  She was saturating at 95% on room air.  She was afebrile at 97.5.  Initial workup notable for normal CBC, BMP with potassium 3.7, glucose 117, creatinine 0.79 with GFR above 60.  Initial troponin elevated at 1682.EKG was obtained did not demonstrate any findings concerning for STEMI.  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  Cardiology consulted.  Heparin initiated per pharmacy dosing.  TRH contacted for admission.   Physical Exam: Vitals and nursing note reviewed.  Constitutional:      General: She is not in acute distress. HENT:     Head: Normocephalic and atraumatic.   Eyes:     Extraocular Movements: Extraocular movements intact.     Pupils: Pupils are equal, round, and reactive to light.  Neck:     Vascular: No JVD.  Cardiovascular:     Rate and Rhythm: Normal  rate and regular rhythm.    Pulmonary:     Effort: Pulmonary effort is normal. No tachypnea or respiratory distress.  Abdominal:     General: Bowel sounds are normal. There is no distension.  Musculoskeletal:     Cervical back: Neck supple.    Assessment and plan: NSTEMI (non-ST elevated myocardial infarction) [facility]) Patient presenting with 2-day history of intermittent waxing and waning chest pain with worsening symptoms today, including exertional chest pain and diaphoresis.  Troponin was significantly elevated up to 3000. patient's risk factors include hypertension, hyperlipidemia, and ongoing tobacco use.   - Cardiology consulted; appreciate their recommendations Cardiologist planning cardiac catheterization on Monday - Heparin per pharmacy dosing - S/p aspirin 325 mg once - Continue aspirin 81 mg  - I continue Crestor to 20 mg daily - Lipid panel pending - Echocardiogram ordered-we will follow-up results - Tobacco cessation encouraged   Penetrating atherosclerotic ulcer of aorta ([place]) Patient has a history of penetrating atherosclerotic ulcers, following with vascular surgery at [facility].  CTA of the aorta  without any acute changes.   Benign essential hypertension - Holding home amlodipine   Mixed hyperlipidemia - Crestor to 20 mg daily - Lipid panel pending   Advance Care Planning:   Code Status: Full Code verified by patient   Consults: Cardiology   Family Communication: Present at bedside   Data reviewed: Showing troponin as high as 3.95  I spent a total of 40 minutes taking care of this patient  Still continues to meet inpatient criteria given cardiac workup being planned  Vitals:   07/09/22 0708 07/09/22 0904 07/09/22 1201 07/09/22 1520  BP:  134/79 135/72 118/74  Pulse:  70 65 65  Resp:  18 18 19   Temp:  97.8 F (36.6 C) 97.7 F (36.5 C) 98 F (36.7 C)  TempSrc:  Oral Oral Oral  SpO2:  98% 99% 98%  Weight: 50.8 kg     Height:          Author: Verline Lema, MD 07/09/2022 5:05 PM  For on call review [email or web address].

## 2022-07-09 NOTE — Consult Note (Addendum)
Cardiology Consultation:   Patient ID: Tiffany Munoz; [id number]; 1946/03/19   Admit date: 07/08/2022 Date of Consult: 07/09/2022  Primary Care Provider: Juluis Pitch, MD Primary Cardiologist: [facility] cardiology Primary Electrophysiologist:  None   Patient Profile:   Tiffany Munoz is a 77 y.o. female with a hx of type B intramural hematoma, penetrating aortic ulceration, PAD, thoracic aortic aneurysm, infrarenal AAA, HTN, and ongoing tobacco use who is being seen today for the evaluation of elevated troponin at the request of Tiffany Munoz.  History of Present Illness:   Tiffany Munoz was previously followed by Tiffany Munoz.  Remote echo from [facility] in 2019 demonstrated an EF of 65 to [id number], grade 1 diastolic dysfunction, degenerative mitral valve disease, moderate LVH, aortic sclerosis, and normal RV systolic function.  Nuclear stress testing in 2020 showed no evidence of ischemia or scar with an LVEF of 65%.  With regards to her PAD, aortic ulcer disease, and AAA, she has been followed by [facility] vascular surgery, last seen them in 05/2022 with the largest thoracic aortic measurement noted to be 4.7 cm at that time with an infrarenal abdominal aorta measuring 3.3 cm.  She has a history of ongoing tobacco use smoking a little over 1 pack/week since age 27.  On the evening of 07/06/2022, she developed chest pressure while at rest that radiated to her back.  Symptoms felt similar to what she had previously experienced when she was diagnosed with her aortic disease.  Symptoms lasted for several hours and spontaneously resolved.  However, over the next couple of days she continued to have waxing and waning chest discomfort that radiated to her back.  Leading up to her admission, some of these episodes were severe and associated with diaphoresis and shortness of breath causing her to have to sit down and rest.  Because of these symptoms, she presented to the ED where she was found to be hemodynamically stable and  afebrile.  Chest x-ray showed no active cardiopulmonary disease.  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  Labs notable for an initial high-sensitivity troponin of 1682 with a delta troponin 2096 currently trended to 3195.  She has been chest pain-free since.  In the ED, she was started on heparin drip.  Currently, without symptoms of angina or cardiac decompensation.    Past Medical History:  Diagnosis Date   AAA (abdominal aortic aneurysm) (HCC)    Hypertension    Migraine     Past Surgical History:  Procedure Laterality Date   CESAREAN SECTION     EXTERNAL EAR SURGERY     TYMPANOSTOMY TUBE PLACEMENT     UTERINE FIBROID SURGERY       Home Meds: Prior to Admission medications   Medication Sig Start Date End Date Taking? Authorizing Provider  amLODipine (NORVASC) 10 MG tablet Take 10 mg by mouth daily.   Yes [provider]  aspirin 81 MG tablet Take 1-2 tablets by mouth daily. 09/19/07  Yes [provider]  cetirizine (ZYRTEC ALLERGY) 10 MG tablet Take 1 tablet by mouth daily as needed for rhinitis or allergies. 09/19/07  Yes [provider]  clobetasol cream (TEMOVATE) [id number] % Apply 1 Application topically 3 (three) times daily as needed. 05/10/22  Yes [provider]  potassium chloride (K-DUR,KLOR-CON) 10 MEQ tablet Take 1 tablet by mouth daily. 10/18/17  Yes [provider]  rosuvastatin (CRESTOR) 10 MG tablet Take 1 tablet by mouth every other day. Taken on Mon, Wed and Fri 01/04/18  Yes [provider]  traMADol (ULTRAM) 50 MG tablet Take 50 mg by mouth every 8 (eight) hours as needed for severe pain or moderate pain.   Yes [provider]  sodium fluoride  (PREVIDENT) 1.1 % GEL dental gel Take 1 application by mouth daily. 05/17/10   [provider]    Inpatient Medications: Scheduled Meds:  aspirin EC  81 mg Oral Daily   [START ON 07/10/2022] rosuvastatin  40 mg Oral Daily   Continuous Infusions:  heparin 800 Units/hr (07/09/22 0554)   PRN Meds: acetaminophen, nitroGLYCERIN, ondansetron (ZOFRAN) IV, traMADol  Allergies:   Allergies  Allergen Reactions   Wellbutrin [Bupropion]     hives    Social History:   Social History   Socioeconomic History   Marital status: Legally Separated    Spouse name: Not on file   Number of children: Not on file   Years of education: Not on file   Highest education level: Not on file  Occupational History   Not on file  Tobacco Use   Smoking status: Every Day    Types: Cigarettes   Smokeless tobacco: Never  Vaping Use   Vaping Use: Never used  Substance and Sexual Activity   Alcohol use: Yes    Alcohol/week: 2.0 standard drinks of alcohol    Types: 2 Cans of beer per week   Drug use: Never   Sexual activity: Not on file  Other Topics Concern   Not on file  Social History Narrative   Not on file   Social Determinants of Health   Financial Resource Strain: Not on file  Food Insecurity: No Food Insecurity (07/08/2022)   Hunger Vital Sign    Worried About Running Out of Food in the Last Year: Never true    Ran Out of Food in the Last Year: Never true  Transportation Needs: No Transportation Needs (07/08/2022)   PRAPARE - Hydrologist (Medical): No    Lack of Transportation (Non-Medical): No  Physical Activity: Not on file  Stress: Not on file  Social Connections: Not on file  Intimate Partner Violence: Not At Risk (07/08/2022)   Humiliation, Afraid, Rape, and Kick questionnaire    Fear of Current or Ex-Partner: No    Emotionally Abused: No    Physically Abused: No    Sexually Abused: No     Family History:   Family History  Problem Relation  Age of Onset   Heart disease Mother    Heart disease Father    Stroke Sister    Hypertension Brother    Cancer Brother    Hemochromatosis Brother    Gout Brother     ROS:  Review of Systems  Constitutional:  Positive for diaphoresis. Negative for chills, fever, malaise/fatigue and weight loss.  HENT:  Negative for congestion.   Eyes:  Negative for discharge and redness.  Respiratory:  Positive for shortness of breath. Negative for cough, sputum production and wheezing.   Cardiovascular:  Positive for chest pain. Negative for palpitations, orthopnea, claudication, leg swelling and PND.  Gastrointestinal:  Positive for heartburn. Negative for abdominal pain, nausea and vomiting.  Musculoskeletal:  Positive for back pain. Negative for falls and myalgias.  Skin:  Negative  for rash.  Neurological:  Negative for dizziness, tingling, tremors, sensory change, speech change, focal weakness, loss of consciousness and weakness.  Endo/Heme/Allergies:  Does not bruise/bleed easily.  Psychiatric/Behavioral:  Negative for substance abuse. The patient is not nervous/anxious.   All other systems reviewed and are negative.     Physical Exam/Data:   Vitals:   07/08/22 2006 07/09/22 0006 07/09/22 0409 07/09/22 0904  BP: 123/80 119/71 (!) 142/76 134/79  Pulse: 73 71 73 70  Resp: 18 16 20 18   Temp: 97.7 F (36.5 C) 98.7 F (37.1 C) 97.6 F (36.4 C) 97.8 F (36.6 C)  TempSrc:   Oral Oral  SpO2: 100% 98% 99% 98%  Weight:      Height:        Intake/Output Summary (Last 24 hours) at 07/09/2022 1015 Last data filed at 07/09/2022 1009 Gross per 24 hour  Intake 726.7 ml  Output 200 ml  Net 526.7 ml   Filed Weights   07/08/22 1509  Weight: 49.4 kg   Body mass index is 18.14 kg/m.   Physical Exam: General: Well developed, well nourished, in no acute distress. Head: Normocephalic, atraumatic, sclera non-icteric, no xanthomas, nares without discharge.  Neck: Negative for carotid bruits. JVD  not elevated. Lungs: Clear bilaterally to auscultation without wheezes, rales, or rhonchi. Breathing is unlabored. Heart: RRR with S1 S2. No murmurs, rubs, or gallops appreciated. Abdomen: Soft, non-tender, non-distended with normoactive bowel sounds. No hepatomegaly. No rebound/guarding. No obvious abdominal masses. Msk:  Strength and tone appear normal for age. Extremities: No clubbing or cyanosis. No edema.  Neuro: Alert and oriented X 3. No facial asymmetry. No focal deficit. Moves all extremities spontaneously. Psych:  Responds to questions appropriately with a normal affect.   EKG:  The EKG was personally reviewed and demonstrates: NSR, 64 bpm, no acute ST-T changes Telemetry:  Telemetry was personally reviewed and demonstrates: Sinus rhythm  Weights: Filed Weights   07/08/22 1509  Weight: 49.4 kg    Relevant CV Studies:  Nuclear stress test 06/13/2018 [facility]): FINDINGS:  Regional wall motion:  reveals normal myocardial thickening and wall  motion.  The overall quality of the study is good.   Artifacts noted: no  Left ventricular cavity: normal.   Perfusion Analysis:  SPECT images demonstrate homogeneous tracer  distribution throughout the myocardium.  __________  2D echo 03/31/2018 [facility]):  Left ventricular hypertrophy - moderate   Normal left ventricular systolic function, ejection fraction 65 to [id number]   Diastolic dysfunction - grade I (normal filling pressures)   Degenerative mitral valve disease   Aortic sclerosis   Normal right ventricular systolic function   Mildly elevated right atrial pressure   Laboratory Data:  Chemistry Recent Labs  Lab 07/08/22 1512 07/09/22 0408  NA 139 141  K 3.7 3.6  CL 104 106  CO2 26 25  GLUCOSE 117* 87  BUN 18 19  CREATININE 0.79 0.77  CALCIUM 9.7 9.3  GFRNONAA >60 >60  ANIONGAP 9 10    No results for input(s): "PROT", "ALBUMIN", "AST", "ALT", "ALKPHOS", "BILITOT" in the last 168 hours. Hematology Recent Labs   Lab 07/08/22 1512 07/09/22 0408  WBC 10.5 9.6  RBC 4.76 4.84  HGB 14.3 14.5  HCT 44.3 44.0  MCV 93.1 90.9  MCH 30.0 30.0  MCHC 32.3 33.0  RDW 13.5 13.6  PLT 278 285   Cardiac EnzymesNo results for input(s): "TROPONINI" in the last 168 hours. No results for input(s): "TROPIPOC" in the last 168 hours.  BNPNo  results for input(s): "BNP", "PROBNP" in the last 168 hours.  DDimer No results for input(s): "DDIMER" in the last 168 hours.  Radiology/Studies:  CT Angio Chest Aorta W and/or Wo Contrast  Result Date: 07/08/2022 IMPRESSION: 1. Preferential opacification of the thoracic aorta somewhat limited by early contrast bolus and mixing artifact seen in the descending thoracic aorta. No evident acute dissection or other acute aortic pathology. Noncontrast CT phase was not performed, which somewhat limits evaluation for intramural hematoma. 2. 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 3. 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 Recommend vascular consultation if not already obtained and follow-up imaging in 6 months. 4. Emphysema. Aortic Atherosclerosis (ICD10-I70.0) and Emphysema (ICD10-J43.9). Electronically Signed   By: Delanna Ahmadi M.D.   On: 07/08/2022 16:51   DG Chest 2 View  Result Date: 07/08/2022 IMPRESSION: No active cardiopulmonary disease. Electronically Signed   By: Marijo Conception M.D.   On: 07/08/2022 15:38    Assessment and Plan:   1.  NSTEMI: -Currently chest pain-free -High-sensitivity troponin has trended to 3195, cycle to peak -ASA -Heparin drip -Echo -N.p.o. at midnight on 4/1 -Plan for LHC on 4/1 -No current indication for emergent or LHC given she is without symptoms of angina or cardiac decompensation with stable  EKG -However, if she develops return of symptoms or becomes unstable, urgent LHC will need to be considered -LP(a) pending -ASA, Crestor  2.  PAD/penetrating aortic ulceration/thoracic aortic aneurysm/infrarenal AAA: -Hemodynamically stable -Followed by [facility] vascular surgery -Aspirin and statin as above  3.  HLD: -LDL 128 this admission with goal being less than 55 -Titrate rosuvastatin to 40 mg daily -May need to consider PCSK9 inhibitor  3.  Emphysema with ongoing tobacco use: -Complete cessation is recommended  4.  HTN: -Blood pressure currently well-controlled    Shared Decision Making/Informed Consent{  The risks [stroke (1 in 1000), death (1 in 1000), kidney failure [usually temporary] (1 in 500), bleeding (1 in 200), allergic reaction [possibly serious] (1 in 200)], benefits (diagnostic support and management of coronary artery disease) and alternatives of a cardiac catheterization were discussed in detail with Ms. Mcroy and she is willing to proceed.    For questions or updates, please contact [place] Please consult [email or web address] for contact info under Cardiology/STEMI.   Signed, Christell Faith, PA-C [place] Pager: [phone] 07/09/2022, 10:15 AM   Physician Attestation Patient seen and examined, agree with detailed note by PA/NP unless otherwise stated in my note.   Patient presentation and plan discussed on rounds.   Cardiology consult placed by Dr.  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  Describes symptoms of chest pressure occurring while at home.  She initially thought this was secondary to her diagnosis intramural hematoma but pain persisted prompting her to  come to the ED  Initial workup with EKG was unrevealing, troponins were elevated at 1682, 2096, 3195.  Patient started on heparin per ACS protocol.  Upon my exam, she denies any chest pain symptoms.  Physical exam GEN:  Well nourished, well developed in  no acute distress HEENT: Normal NECK: No JVD; No carotid bruits CARDIAC: RRR, no murmurs, rubs, gallops RESPIRATORY:  Clear to auscultation without rales, wheezing or rhonchi  ABDOMEN: Soft, non-tender, non-distended MUSCULOSKELETAL:  No edema; No deformity  SKIN: Warm and dry NEUROLOGIC:  Alert and oriented x 3 PSYCHIATRIC:  Normal affect   A/P: 1.  Chest pain, NSTEMI -Currently asymptomatic -Aspirin, Crestor, heparin drip -Get echo -Plan left heart cath Monday  2.  Hypertension -BP controlled -Restart Norvasc at 5 mg daily  3.  History of ascending aorta aneurysm, infrarenal aortic aneurysm -Continue monitoring with outpatient vascular surgeon at [place] than 50% was spent in counseling and coordination of care with patient Total encounter time 85 minutes or more   Signed: Kate Sable, M.D. [facility]

## 2022-07-09 NOTE — Progress Notes (Signed)
  Echocardiogram 2D Echocardiogram has been performed.  Tiffany Munoz 07/09/2022, 2:54 PM

## 2022-07-09 NOTE — Consult Note (Signed)
ANTICOAGULATION CONSULT NOTE  Pharmacy Consult for IV Heparin Indication: chest pain/ACS  Patient Measurements: Height: 5\' 5"  (165.1 cm) Weight: 49.4 kg (109 lb) IBW/kg (Calculated) : 57 Heparin Dosing Weight: 49.4 kg  Labs: Recent Labs    07/08/22 1512 07/08/22 1723 07/08/22 1724 07/08/22 2014 07/09/22 0408 07/09/22 1339  HGB 14.3  --   --   --  14.5  --   HCT 44.3  --   --   --  44.0  --   PLT 278  --   --   --  285  --   APTT  --   --  34  --   --   --   LABPROT  --   --  14.6  --   --   --   INR  --   --  1.2  --   --   --   HEPARINUNFRC  --   --   --   --  0.16* 0.27*  CREATININE 0.79  --   --   --  0.77  --   TROPONINIHS 1,682* 2,096*  --  3,195*  --   --     Estimated Creatinine Clearance: 47.4 mL/min (by C-G formula based on SCr of 0.77 mg/dL).  Medical History: Past Medical History:  Diagnosis Date   AAA (abdominal aortic aneurysm) (HCC)    Hypertension    Migraine    Medications:  No anticoagulation prior to admission per my chart review. Medication reconciliation is pending  Assessment: 77 y/o F with medical history as above presenting to the ED 3/29 with chest discomfort x 3 days radiating to back. Pharmacy consulted to initiate and manage heparin infusion for suspected ACS.  Baseline aPTT and PT-INR are pending. Baseline CBC within normal limits.  3/30:  HL @ 0408 = 0.16, SUBtherapeutic   inc drip to 800 u/hr 3/30:  HL @ 1339 = 0.27, SUBtherapeutic  inc drip from 800 to 900 u/hr   Goal of Therapy:  Heparin level 0.3-0.7 units/ml Monitor platelets by anticoagulation protocol: Yes   Plan:  3/30:  HL @ 1339 = 0.27, SUBtherapeutic - Will order heparin 1500 units IV X 1 bolus and increase drip rate to 900 units/hr.  - Will order HL 8 hrs after rate change ----Daily CBC per protocol while on IV heparin  Kenadi Miltner A 07/09/2022,2:11 PM

## 2022-07-09 NOTE — Consult Note (Signed)
ANTICOAGULATION CONSULT NOTE  Pharmacy Consult for IV Heparin Indication: chest pain/ACS  Patient Measurements: Height: 5\' 5"  (165.1 cm) Weight: 50.8 kg (111 lb 14.4 oz) IBW/kg (Calculated) : 57 Heparin Dosing Weight: 49.4 kg  Labs: Recent Labs    07/08/22 1512 07/08/22 1723 07/08/22 1724 07/08/22 2014 07/09/22 0408 07/09/22 1339 07/09/22 2229  HGB 14.3  --   --   --  14.5  --   --   HCT 44.3  --   --   --  44.0  --   --   PLT 278  --   --   --  285  --   --   APTT  --   --  34  --   --   --   --   LABPROT  --   --  14.6  --   --   --   --   INR  --   --  1.2  --   --   --   --   HEPARINUNFRC  --   --   --   --  0.16* 0.27* 0.56  CREATININE 0.79  --   --   --  0.77  --   --   TROPONINIHS 1,682* 2,096*  --  3,195*  --   --   --     Estimated Creatinine Clearance: 48.7 mL/min (by C-G formula based on SCr of 0.77 mg/dL).  Medical History: Past Medical History:  Diagnosis Date   AAA (abdominal aortic aneurysm) (HCC)    Hypertension    Migraine    Medications:  No anticoagulation prior to admission per my chart review. Medication reconciliation is pending  Assessment: 77 y/o F with medical history as above presenting to the ED 3/29 with chest discomfort x 3 days radiating to back. Pharmacy consulted to initiate and manage heparin infusion for suspected ACS.  Baseline aPTT and PT-INR are pending. Baseline CBC within normal limits.  3/30:  HL @ 0408 = 0.16, SUBtherapeutic   inc drip to 800 u/hr 3/30:  HL @ 1339 = 0.27, SUBtherapeutic  inc drip from 800 to 900 u/hr 3/30:  HL @ 2229 = 0.56, therapeutic X 1 @ 900 un/hr    Goal of Therapy:  Heparin level 0.3-0.7 units/ml Monitor platelets by anticoagulation protocol: Yes   Plan:  3/30:  HL @ 2229 = 0.56, therapeutic X 1 @ 900 units/hr - Will continue pt on current rate and recheck HL on 3/31 @ 0600.  ----Daily CBC per protocol while on IV heparin  Alberto Schoch D 07/09/2022,11:07 PM

## 2022-07-09 NOTE — Consult Note (Signed)
[place] for IV Heparin Indication: chest pain/ACS  Patient Measurements: Height: 5\' 5"  (165.1 cm) Weight: 49.4 kg (109 lb) IBW/kg (Calculated) : 57 Heparin Dosing Weight: 49.4 kg  Labs: Recent Labs    07/08/22 1512 07/08/22 1723 07/08/22 1724 07/08/22 2014 07/09/22 0408  HGB 14.3  --   --   --  14.5  HCT 44.3  --   --   --  44.0  PLT 278  --   --   --  285  APTT  --   --  34  --   --   LABPROT  --   --  14.6  --   --   INR  --   --  1.2  --   --   HEPARINUNFRC  --   --   --   --  0.16*  CREATININE 0.79  --   --   --  0.77  TROPONINIHS 1,682* 2,096*  --  3,195*  --     Estimated Creatinine Clearance: 47.4 mL/min (by C-G formula based on SCr of 0.77 mg/dL).  Medical History: Past Medical History:  Diagnosis Date   AAA (abdominal aortic aneurysm) (HCC)    Hypertension    Migraine    Medications:  No anticoagulation prior to admission per my chart review. Medication reconciliation is pending  Assessment: 77 y/o F with medical history as above presenting to the ED 3/29 with chest discomfort x 3 days radiating to back. Pharmacy consulted to initiate and manage heparin infusion for suspected ACS.  Baseline aPTT and PT-INR are pending. Baseline CBC within normal limits.  Goal of Therapy:  Heparin level 0.3-0.7 units/ml Monitor platelets by anticoagulation protocol: Yes   Plan:  3/30:  HL @ 0408 = 0.16, SUBtherapeutic - Will order heparin 1500 units IV X 1 bolus and increase drip rate to 800 units/hr.  - Will order HL 8 hrs after rate change ----Daily CBC per protocol while on IV heparin  Kailey Esquilin D 07/09/2022,5:35 AM

## 2022-07-10 DIAGNOSIS — I214 Non-ST elevation (NSTEMI) myocardial infarction: Secondary | ICD-10-CM | POA: Diagnosis not present

## 2022-07-10 DIAGNOSIS — I10 Essential (primary) hypertension: Secondary | ICD-10-CM | POA: Diagnosis not present

## 2022-07-10 LAB — HEPARIN LEVEL (UNFRACTIONATED): Heparin Unfractionated: 0.49 IU/mL (ref 0.30–0.70)

## 2022-07-10 LAB — CBC
HCT: 41 % (ref 36.0–46.0)
Hemoglobin: 13.3 g/dL (ref 12.0–15.0)
MCH: 30 pg (ref 26.0–34.0)
MCHC: 32.4 g/dL (ref 30.0–36.0)
MCV: 92.3 fL (ref 80.0–100.0)
Platelets: 255 10*3/uL (ref 150–400)
RBC: 4.44 MIL/uL (ref 3.87–5.11)
RDW: 13.7 % (ref 11.5–15.5)
WBC: 7.1 10*3/uL (ref 4.0–10.5)
nRBC: 0 % (ref 0.0–0.2)

## 2022-07-10 LAB — TROPONIN I (HIGH SENSITIVITY): Troponin I (High Sensitivity): 1536 ng/L (ref <18)

## 2022-07-10 MED ORDER — POTASSIUM CHLORIDE CRYS ER 10 MEQ PO TBCR
10.0000 meq | EXTENDED_RELEASE_TABLET | Freq: Every day | ORAL | Status: DC
  Administered 2022-07-10 – 2022-07-12 (×2): 10 meq via ORAL
  Filled 2022-07-10 (×2): qty 1

## 2022-07-10 MED ORDER — SODIUM CHLORIDE 0.9 % WEIGHT BASED INFUSION: 3.0000 mL/kg/h | INTRAVENOUS | Status: DC

## 2022-07-10 MED ORDER — SODIUM CHLORIDE 0.9% FLUSH: 3.0000 mL | Freq: Two times a day (BID) | INTRAVENOUS | Status: DC

## 2022-07-10 MED ORDER — SODIUM CHLORIDE 0.9% FLUSH: 3.0000 mL | INTRAVENOUS | Status: DC | PRN

## 2022-07-10 MED ORDER — SODIUM CHLORIDE 0.9 % WEIGHT BASED INFUSION
1.0000 mL/kg/h | INTRAVENOUS | Status: DC
  Administered 2022-07-11: 1 mL/kg/h via INTRAVENOUS

## 2022-07-10 MED ORDER — SODIUM CHLORIDE 0.9 % IV SOLN: 250.0000 mL | INTRAVENOUS | Status: DC | PRN

## 2022-07-10 NOTE — Progress Notes (Signed)
Progress Note   Patient: Tiffany Munoz [id number] DOB: 07/19/1945 DOA: 07/08/2022     2 DOS: the patient was seen and examined on 07/10/2022    Subjective:  Patient seen and examined at bedside this morning She admits to improvement in her chest pain Awaiting cardiac catheterization tomorrow Denies nausea vomiting or abdominal pain   Brief hospital course: Tiffany Munoz is a 77 y.o. 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   Tiffany Munoz states that on the evening of 07/06/2022, she developed chest pain when she was resting.  She states that it felt pressure-like and more of a discomfort rather than pain that radiated to her back.  Pain was waxing and waning; she noticed today that exertion made her chest pain worse.    On arrival to the ED, patient was normotensive at 125/66 with heart rate of 64.  She was saturating at 95% on room air.  She was afebrile at 97.5.  Initial workup notable for normal CBC, BMP with potassium 3.7, glucose 117, creatinine 0.79 with GFR above 60.  Initial troponin elevated at 1682.EKG was obtained did not demonstrate any findings concerning for STEMI.  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  Cardiology consulted.  Heparin initiated per pharmacy dosing.  TRH contacted for admission.     Physical Exam: Vitals and nursing note reviewed.  Constitutional:      General: She is not in acute distress. HENT:     Head: Normocephalic and atraumatic.  Eyes:     Extraocular Movements: Extraocular movements intact.     Pupils: Pupils are equal, round, and reactive to light.  Neck:     Vascular: No JVD.   Cardiovascular:     Rate and Rhythm: Normal rate and regular rhythm.  Pulmonary:     Effort: Pulmonary effort is normal. No tachypnea or respiratory distress.  Abdominal:     General: Bowel sounds are normal. There is no distension.  Musculoskeletal:     Cervical back: Neck supple.      Assessment and plan: NSTEMI (non-ST elevated myocardial infarction) [facility]) Patient presenting with 2-day history of intermittent waxing and waning chest pain with worsening symptoms today, including exertional chest pain and diaphoresis.  Troponin was significantly elevated up to 3000. patient's risk factors include hypertension, hyperlipidemia, and ongoing tobacco use.   - Cardiology consulted; appreciate their recommendations Cardiologist planning cardiac catheterization on Monday Will keep n.p.o. midnight - Heparin per pharmacy dosing - S/p aspirin 325 mg once - Continue aspirin 81 mg  - I continue Crestor to 20 mg daily - Lipid panel pending - Echocardiogram ordered-we will follow-up results - Tobacco cessation encouraged   Penetrating atherosclerotic ulcer of aorta ([place]) Patient has a history of penetrating atherosclerotic ulcers, following with vascular surgery at [facility].  CTA of the aorta  without any acute changes.   Benign essential hypertension - Holding home amlodipine   Mixed hyperlipidemia - Crestor to 20 mg daily - Lipid panel pending   Advance Care Planning:   Code Status: Full Code verified by patient   Consults: Cardiology   Family Communication: Present at bedside     Data reviewed: CBC and BMP results reviewed by me today  I spent a total of 42 minutes taking care of this patient   Still continues to meet inpatient criteria given cardiac workup being planned      Vitals:   07/10/22 0027 07/10/22 0518 07/10/22 0519 07/10/22 0820  BP: 137/65  139/73 126/68  Pulse: 74  73 71  Resp: 18  18 18   Temp: (!) 97.5 F (36.4 C)  98 F (36.7 C) 97.9 F (36.6 C)   TempSrc:    Oral  SpO2: 98%  93% 97%  Weight:  53.5 kg    Height:       Author: Verline Lema, MD 07/10/2022 12:52 PM  For on call review [email or web address].

## 2022-07-10 NOTE — Progress Notes (Signed)
Mobility Specialist - Progress Note   07/10/22 1327  Mobility  Activity Ambulated with assistance in hallway;Stood at bedside;Dangled on edge of bed;Ambulated with assistance in room;Ambulated with assistance to bathroom  Level of Assistance Standby assist, set-up cues, supervision of patient - no hands on  Assistive Device None  Distance Ambulated (ft) 250 ft  Activity Response Tolerated well  Mobility Referral Yes  $Mobility charge 1 Mobility   Pt supine in bed on RA upon arrival. Pt STS and ambulates in hallway, to/from bathroom SBA with one minor LOB corrected by pt. Pt returns to bed with needs in reach.   Gretchen Short  Mobility Specialist  07/10/22 1:28 PM

## 2022-07-10 NOTE — Progress Notes (Signed)
Mobility Specialist - Progress Note   07/10/22 0845  Mobility  Activity Ambulated with assistance in hallway;Stood at bedside;Dangled on edge of bed  Level of Assistance Standby assist, set-up cues, supervision of patient - no hands on  Assistive Device None  Distance Ambulated (ft) 240 ft  Activity Response Tolerated well  Mobility Referral Yes  $Mobility charge 1 Mobility   Pt supine in bed on RA upon arrival. Pt STS and ambulates in hallway Supervision-Indep. Pt returns to bed with needs in reach.   Gretchen Short  Mobility Specialist  07/10/22 8:45 AM

## 2022-07-10 NOTE — Consult Note (Signed)
ANTICOAGULATION CONSULT NOTE  Pharmacy Consult for IV Heparin Indication: chest pain/ACS  Patient Measurements: Height: 5\' 5"  (165.1 cm) Weight: 53.5 kg (117 lb 15.1 oz) IBW/kg (Calculated) : 57 Heparin Dosing Weight: 49.4 kg  Labs: Recent Labs    07/08/22 1512 07/08/22 1512 07/08/22 1723 07/08/22 1724 07/08/22 2014 07/09/22 0408 07/09/22 1339 07/09/22 2229 07/10/22 0449  HGB 14.3  --   --   --   --  14.5  --   --  13.3  HCT 44.3  --   --   --   --  44.0  --   --  41.0  PLT 278  --   --   --   --  285  --   --  255  APTT  --   --   --  34  --   --   --   --   --   LABPROT  --   --   --  14.6  --   --   --   --   --   INR  --   --   --  1.2  --   --   --   --   --   HEPARINUNFRC  --    < >  --   --   --  0.16* 0.27* 0.56 0.49  CREATININE 0.79  --   --   --   --  0.77  --   --   --   TROPONINIHS 1,682*  --  2,096*  --  3,195*  --   --   --   --    < > = values in this interval not displayed.    Estimated Creatinine Clearance: 51.3 mL/min (by C-G formula based on SCr of 0.77 mg/dL).  Medical History: Past Medical History:  Diagnosis Date   AAA (abdominal aortic aneurysm) (HCC)    Hypertension    Migraine    Medications:  No anticoagulation prior to admission per my chart review. Medication reconciliation is pending  Assessment: 77 y/o F with medical history as above presenting to the ED 3/29 with chest discomfort x 3 days radiating to back. Pharmacy consulted to initiate and manage heparin infusion for suspected ACS.  Baseline aPTT and PT-INR are pending. Baseline CBC within normal limits.  3/30:  HL @ 0408 = 0.16, SUBtherapeutic   inc drip to 800 u/hr 3/30:  HL @ 1339 = 0.27, SUBtherapeutic  inc drip from 800 to 900 u/hr 3/30:  HL @ 2229 = 0.56, therapeutic X 1 @ 900 un/hr  3/31:  HL @ 0449 = 0.49, therapeutic X 2 @ 900 un/hr   Goal of Therapy:  Heparin level 0.3-0.7 units/ml Monitor platelets by anticoagulation protocol: Yes   Plan:  3/31:  HL @ 0449 =  0.49, therapeutic X 2 @ 0900 un/hr - Will continue pt on current rate and recheck HL on 04/01 with AM labs. ----Daily CBC per protocol while on IV heparin  Cristela Stalder D 07/10/2022,6:06 AM

## 2022-07-10 NOTE — Progress Notes (Signed)
Progress Note  Patient Name: Tiffany Munoz Date of Encounter: 07/10/2022  Primary Cardiologist: New - consult by Agbor-Etang  Subjective   No chest pain or dyspnea.   Inpatient Medications    Scheduled Meds:  amLODipine  5 mg Oral QPM   aspirin EC  81 mg Oral Daily   rosuvastatin  40 mg Oral Daily   Continuous Infusions:  heparin 900 Units/hr (07/10/22 0438)   PRN Meds: acetaminophen, nitroGLYCERIN, ondansetron (ZOFRAN) IV, traMADol   Vital Signs    Vitals:   07/09/22 2009 07/10/22 0027 07/10/22 0518 07/10/22 0519  BP: 124/70 137/65  139/73  Pulse: 66 74  73  Resp: 18 18  18   Temp: 98.5 F (36.9 C) (!) 97.5 F (36.4 C)  98 F (36.7 C)  TempSrc:      SpO2: 98% 98%  93%  Weight:   53.5 kg   Height:        Intake/Output Summary (Last 24 hours) at 07/10/2022 0801 Last data filed at 07/10/2022 0438 Gross per 24 hour  Intake 1375.1 ml  Output --  Net 1375.1 ml   Filed Weights   07/08/22 1509 07/09/22 0708 07/10/22 0518  Weight: 49.4 kg 50.8 kg 53.5 kg    Telemetry    SR with rare PVC and artifact - Personally Reviewed  ECG    No new tracings - Personally Reviewed  Physical Exam   GEN: No acute distress.   Neck: No JVD. Cardiac: RRR, no murmurs, rubs, or gallops.  Respiratory: Clear to auscultation bilaterally.  GI: Soft, nontender, non-distended.   MS: No edema; No deformity. Neuro:  Alert and oriented x 3; Nonfocal.  Psych: Normal affect.  Labs    Chemistry Recent Labs  Lab 07/08/22 1512 07/09/22 0408  NA 139 141  K 3.7 3.6  CL 104 106  CO2 26 25  GLUCOSE 117* 87  BUN 18 19  CREATININE 0.79 0.77  CALCIUM 9.7 9.3  GFRNONAA >60 >60  ANIONGAP 9 10     Hematology Recent Labs  Lab 07/08/22 1512 07/09/22 0408 07/10/22 0449  WBC 10.5 9.6 7.1  RBC 4.76 4.84 4.44  HGB 14.3 14.5 13.3  HCT 44.3 44.0 41.0  MCV 93.1 90.9 92.3  MCH 30.0 30.0 30.0  MCHC 32.3 33.0 32.4  RDW 13.5 13.6 13.7  PLT 278 285 255    Cardiac EnzymesNo  results for input(s): "TROPONINI" in the last 168 hours. No results for input(s): "TROPIPOC" in the last 168 hours.   BNPNo results for input(s): "BNP", "PROBNP" in the last 168 hours.   DDimer No results for input(s): "DDIMER" in the last 168 hours.   Radiology    CT Angio Chest Aorta W and/or Wo Contrast  Result Date: 07/08/2022 IMPRESSION: 1. Preferential opacification of the thoracic aorta somewhat limited by early contrast bolus and mixing artifact seen in the descending thoracic aorta. No evident acute dissection or other acute aortic pathology. Noncontrast CT phase was not performed, which somewhat limits evaluation for intramural hematoma. 2. 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 3. 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 Recommend vascular consultation if not already obtained and follow-up imaging in 6 months. 4. Emphysema. Aortic Atherosclerosis (ICD10-I70.0) and Emphysema (ICD10-J43.9). Electronically Signed   By: Jamse Mead.D.  On: 07/08/2022 16:51   DG Chest 2 View  Result Date: 07/08/2022 IMPRESSION: No active cardiopulmonary disease. Electronically Signed   By: Marijo Conception M.D.   On: 07/08/2022 15:38    Cardiac Studies   2D echo 07/09/2022: 1. Left ventricular ejection fraction, by estimation, is 55 to 60%. Left  ventricular ejection fraction by 2D MOD biplane is 66.1 %. The left  ventricle has normal function. The left ventricle has no regional wall  motion abnormalities. There is mild left  ventricular hypertrophy. Left ventricular diastolic parameters are  consistent with Grade I diastolic dysfunction (impaired relaxation).   2. Right ventricular systolic function is normal. The right ventricular  size is normal.    3. The mitral valve is normal in structure. No evidence of mitral valve  regurgitation.   4. The aortic valve is tricuspid. Aortic valve regurgitation is not  visualized. Aortic valve sclerosis is present, with no evidence of aortic  valve stenosis.   5. The inferior vena cava is normal in size with greater than 50%  respiratory variability, suggesting right atrial pressure of 3 mmHg.   Patient Profile     77 y.o. female with history of type B intramural hematoma, penetrating aortic ulceration, PAD, thoracic aortic aneurysm, infrarenal AAA, HTN, and ongoing tobacco use who is being seen today for the evaluation of elevated troponin at the request of Dr. Charleen Kirks.   Assessment & Plan    1. NSTEMI: -Currently chest pain-free -High-sensitivity troponin has trended to 3195, cycle to peak -ASA -Heparin drip -Echo with preserved LVSF and normal wall motion  -N.p.o. at midnight  -Plan for LHC on 4/1 -No current indication for emergent or LHC given she is without symptoms of angina or cardiac decompensation with stable EKG -However, if she develops return of symptoms or becomes unstable, urgent LHC will need to be considered -LP(a) pending -ASA, Crestor   2.  PAD/penetrating aortic ulceration/thoracic aortic aneurysm/infrarenal AAA: -Hemodynamically stable -Followed by [facility] vascular surgery -Aspirin and statin as above   3.  HLD: -LDL 128 this admission with goal being less than 55 -Now on titrated dose of rosuvastatin to 40 mg daily -May need to consider PCSK9 inhibitor   3.  Emphysema with ongoing tobacco use: -Complete cessation is recommended   4.  HTN: -Blood pressure currently well-controlled -Amlodipine    Shared Decision Making/Informed Consent{  The risks [stroke (1 in 1000), death (1 in 1000), kidney failure [usually temporary] (1 in 500), bleeding (1 in 200), allergic reaction [possibly serious] (1 in 200)], benefits (diagnostic support and management of coronary  artery disease) and alternatives of a cardiac catheterization were discussed in detail with Ms. Glaude and she is willing to proceed.   For questions or updates, please contact [place] Please consult [email or web address] for contact info under Cardiology/STEMI.    Signed, Christell Faith, PA-C [place] Pager: [phone] 07/10/2022, 8:01 AM

## 2022-07-11 ENCOUNTER — Other Ambulatory Visit: Payer: Self-pay

## 2022-07-11 ENCOUNTER — Encounter
Admission: EM | Disposition: A | Discharge status: Home / Self Care | Payer: Self-pay | Source: Home / Self Care | Attending: Internal Medicine

## 2022-07-11 DIAGNOSIS — I2510 Atherosclerotic heart disease of native coronary artery without angina pectoris: Secondary | ICD-10-CM

## 2022-07-11 DIAGNOSIS — I214 Non-ST elevation (NSTEMI) myocardial infarction: Secondary | ICD-10-CM | POA: Diagnosis not present

## 2022-07-11 HISTORY — PX: CORONARY PRESSURE/FFR STUDY: CATH118243

## 2022-07-11 HISTORY — PX: CORONARY STENT INTERVENTION: CATH118234

## 2022-07-11 HISTORY — PX: LEFT HEART CATH AND CORONARY ANGIOGRAPHY: CATH118249

## 2022-07-11 LAB — POCT ACTIVATED CLOTTING TIME
Activated Clotting Time: 271 seconds
Activated Clotting Time: 358 seconds

## 2022-07-11 LAB — LIPOPROTEIN A (LPA): Lipoprotein (a): 415.8 nmol/L — ABNORMAL HIGH (ref <75.0)

## 2022-07-11 LAB — HEMOGLOBIN A1C
Hgb A1c MFr Bld: 5.1 % (ref 4.8–5.6)
Mean Plasma Glucose: 100 mg/dL

## 2022-07-11 LAB — HEPARIN LEVEL (UNFRACTIONATED): Heparin Unfractionated: 0.55 IU/mL (ref 0.30–0.70)

## 2022-07-11 SURGERY — LEFT HEART CATH AND CORONARY ANGIOGRAPHY
Anesthesia: Moderate Sedation

## 2022-07-11 MED ORDER — HEPARIN (PORCINE) IN NACL 2000-0.9 UNIT/L-% IV SOLN
INTRAVENOUS | Status: DC | PRN
  Administered 2022-07-11: 1000 mL

## 2022-07-11 MED ORDER — FENTANYL CITRATE (PF) 100 MCG/2ML IJ SOLN
INTRAMUSCULAR | Status: AC
  Filled 2022-07-11: qty 2

## 2022-07-11 MED ORDER — TICAGRELOR 90 MG PO TABS
90.0000 mg | ORAL_TABLET | Freq: Two times a day (BID) | ORAL | Status: DC
  Administered 2022-07-11 – 2022-07-12 (×2): 90 mg via ORAL
  Filled 2022-07-11 (×2): qty 1

## 2022-07-11 MED ORDER — FENTANYL CITRATE (PF) 100 MCG/2ML IJ SOLN
INTRAMUSCULAR | Status: DC | PRN
  Administered 2022-07-11: 25 ug via INTRAVENOUS

## 2022-07-11 MED ORDER — MIDAZOLAM HCL 2 MG/2ML IJ SOLN
INTRAMUSCULAR | Status: DC | PRN
  Administered 2022-07-11: 1 mg via INTRAVENOUS

## 2022-07-11 MED ORDER — HEPARIN SODIUM (PORCINE) 1000 UNIT/ML IJ SOLN
INTRAMUSCULAR | Status: DC | PRN
  Administered 2022-07-11: 3000 [IU] via INTRAVENOUS
  Administered 2022-07-11: 2500 [IU] via INTRAVENOUS

## 2022-07-11 MED ORDER — NITROGLYCERIN 1 MG/10 ML FOR IR/CATH LAB
INTRA_ARTERIAL | Status: AC
  Filled 2022-07-11: qty 10

## 2022-07-11 MED ORDER — SODIUM CHLORIDE 0.9 % WEIGHT BASED INFUSION: 1.0000 mL/kg/h | INTRAVENOUS | Status: AC

## 2022-07-11 MED ORDER — IOHEXOL 300 MG/ML  SOLN
INTRAMUSCULAR | Status: DC | PRN
  Administered 2022-07-11: 180 mL

## 2022-07-11 MED ORDER — TRAMADOL HCL 50 MG PO TABS
ORAL_TABLET | ORAL | Status: AC
  Filled 2022-07-11: qty 1

## 2022-07-11 MED ORDER — MORPHINE SULFATE (PF) 4 MG/ML IV SOLN
2.0000 mg | Freq: Once | INTRAVENOUS | Status: AC
  Administered 2022-07-11: 2 mg via INTRAVENOUS
  Filled 2022-07-11: qty 1

## 2022-07-11 MED ORDER — VERAPAMIL HCL 2.5 MG/ML IV SOLN
INTRAVENOUS | Status: DC | PRN
  Administered 2022-07-11: 2.5 mg via INTRA_ARTERIAL

## 2022-07-11 MED ORDER — ASPIRIN 81 MG PO CHEW
CHEWABLE_TABLET | ORAL | Status: AC
  Administered 2022-07-11: 81 mg
  Filled 2022-07-11: qty 1

## 2022-07-11 MED ORDER — SODIUM CHLORIDE 0.9% FLUSH
3.0000 mL | Freq: Two times a day (BID) | INTRAVENOUS | Status: DC
  Administered 2022-07-11 – 2022-07-12 (×2): 3 mL via INTRAVENOUS

## 2022-07-11 MED ORDER — VERAPAMIL HCL 2.5 MG/ML IV SOLN
INTRAVENOUS | Status: AC
  Filled 2022-07-11: qty 2

## 2022-07-11 MED ORDER — NITROGLYCERIN 1 MG/10 ML FOR IR/CATH LAB
INTRA_ARTERIAL | Status: DC | PRN
  Administered 2022-07-11: 200 ug via INTRACORONARY

## 2022-07-11 MED ORDER — TICAGRELOR 90 MG PO TABS
ORAL_TABLET | ORAL | Status: DC | PRN
  Administered 2022-07-11: 180 mg via ORAL

## 2022-07-11 MED ORDER — MIDAZOLAM HCL 2 MG/2ML IJ SOLN
INTRAMUSCULAR | Status: AC
  Filled 2022-07-11: qty 2

## 2022-07-11 MED ORDER — METOPROLOL SUCCINATE ER 25 MG PO TB24: 25.0000 mg | ORAL_TABLET | Freq: Every day | ORAL | Status: DC

## 2022-07-11 MED ORDER — HEPARIN SODIUM (PORCINE) 1000 UNIT/ML IJ SOLN
INTRAMUSCULAR | Status: AC
  Filled 2022-07-11: qty 10

## 2022-07-11 MED ORDER — SODIUM CHLORIDE 0.9 % IV SOLN: 250.0000 mL | INTRAVENOUS | Status: DC | PRN

## 2022-07-11 MED ORDER — HEPARIN (PORCINE) IN NACL 1000-0.9 UT/500ML-% IV SOLN
INTRAVENOUS | Status: AC
  Filled 2022-07-11: qty 1000

## 2022-07-11 MED ORDER — CARVEDILOL 3.125 MG PO TABS
3.1250 mg | ORAL_TABLET | Freq: Two times a day (BID) | ORAL | Status: DC
  Administered 2022-07-11 – 2022-07-12 (×2): 3.125 mg via ORAL
  Filled 2022-07-11 (×2): qty 1

## 2022-07-11 MED ORDER — SODIUM CHLORIDE 0.9% FLUSH: 3.0000 mL | INTRAVENOUS | Status: DC | PRN

## 2022-07-11 MED ORDER — TICAGRELOR 90 MG PO TABS
ORAL_TABLET | ORAL | Status: AC
  Filled 2022-07-11: qty 2

## 2022-07-11 SURGICAL SUPPLY — 22 items
BALLN TREK RX 2.5X12 (BALLOONS) ×1
BALLN ~~LOC~~ TREK NEO RX 2.5X12 (BALLOONS) ×1
BALLOON TREK RX 2.5X12 (BALLOONS) IMPLANT
BALLOON ~~LOC~~ TREK NEO RX 2.5X12 (BALLOONS) IMPLANT
CATH INFINITI 5FR JK (CATHETERS) IMPLANT
CATH LAUNCHER 6FR EBU3.5 (CATHETERS) IMPLANT
DEVICE RAD TR BAND REGULAR (VASCULAR PRODUCTS) IMPLANT
DRAPE BRACHIAL (DRAPES) IMPLANT
GLIDESHEATH SLEND SS 6F .021 (SHEATH) IMPLANT
GUIDEWIRE INQWIRE 1.5J.035X260 (WIRE) IMPLANT
GUIDEWIRE PRESSURE X 175 (WIRE) IMPLANT
INQWIRE 1.5J .035X260CM (WIRE) ×1
KIT ENCORE 26 ADVANTAGE (KITS) IMPLANT
KIT SYRINGE INJ CVI SPIKEX1 (MISCELLANEOUS) IMPLANT
PACK CARDIAC CATH (CUSTOM PROCEDURE TRAY) ×1 IMPLANT
PROTECTION STATION PRESSURIZED (MISCELLANEOUS) ×1
SET ATX-X65L (MISCELLANEOUS) IMPLANT
STATION PROTECTION PRESSURIZED (MISCELLANEOUS) IMPLANT
STENT ONYX FRONTIER 2.25X15 (Permanent Stent) IMPLANT
STENT ONYX FRONTIER 4.0X12 (Permanent Stent) IMPLANT
TUBING CIL FLEX 10 FLL-RA (TUBING) IMPLANT
WIRE RUNTHROUGH .014X180CM (WIRE) IMPLANT

## 2022-07-11 NOTE — Progress Notes (Signed)
Patient and an episode of dizziness followed by an vomiting.  Gave Zofran.  Will recheck prior to giving oral medications.

## 2022-07-11 NOTE — Progress Notes (Signed)
Progress Note   Patient: Tiffany Munoz [id number] DOB: [birth year] DOA: 07/08/2022     3 DOS: the patient was seen and examined on 07/11/2022     Subjective:  Patient seen and examined at bedside this morning She admits to improvement in her chest pain Patient underwent cardiac catheterization today with placement of stents Denies nausea vomiting or abdominal pain   Brief hospital course: Tiffany Munoz is a 77 y.o. 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 Tiffany Munoz states that on the evening of 07/06/2022, she developed chest pain when she was resting.  She states that it felt pressure-like and more of a discomfort rather than pain that radiated to her back.  Pain was waxing and waning; she noticed today that exertion made her chest pain worse.    On arrival to the ED, patient was normotensive at 125/66 with heart rate of 64.  She was saturating at 95% on room air.  She was afebrile at 97.5.  Initial workup notable for normal CBC, BMP with potassium 3.7, glucose 117, creatinine 0.79 with GFR above 60.  Initial troponin elevated at 1682.EKG was obtained did not demonstrate any findings concerning for STEMI.  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  Cardiology consulted.      Physical Exam: Vitals and nursing note reviewed.  Constitutional:      General: She is not in acute distress. HENT:     Head: Normocephalic and atraumatic.  Eyes:     Extraocular Movements: Extraocular movements intact.     Pupils: Pupils are equal, round, and reactive to light.  Neck:     Vascular: No JVD.  Cardiovascular:     Rate and Rhythm:  Normal rate and regular rhythm.  Pulmonary:     Effort: Pulmonary effort is normal. No tachypnea or respiratory distress.  Abdominal:     General: Bowel sounds are normal. There is no distension.  Musculoskeletal:     Cervical back: Neck supple.      Assessment and plan: NSTEMI (non-ST elevated myocardial infarction) [facility]) Patient presenting with 2-day history of intermittent waxing and waning chest pain with worsening symptoms today, including exertional chest pain and diaphoresis.  Troponin was significantly elevated up to 3000. patient's risk factors include hypertension, hyperlipidemia, and ongoing tobacco use.   - Cardiology consulted; appreciate their recommendations Patient is s/p cardiac catheterization with drug-eluting stent placement on 07/11/2022 - Heparin per pharmacy dosing - S/p aspirin 325 mg once - antiplatelet therapy according to cardiologist - I continue Crestor to 20 mg daily - Lipid panel pending - Echocardiogram showing ejection fraction 55 to 60% - Tobacco cessation encouraged   Penetrating atherosclerotic ulcer of aorta (HCC) Patient has a history of penetrating atherosclerotic ulcers, following with vascular surgery at [facility].  CTA of the aorta  without any acute changes.   Benign essential hypertension - Holding home amlodipine   Mixed hyperlipidemia - Crestor to 20 mg daily - Lipid panel pending   Advance Care Planning:   Code Status: Full Code verified by patient   Consults: Cardiology   Family Communication: Present at bedside     Data reviewed: CBC and BMP results reviewed by me today   I  spent a total of 40 minutes taking care of this patient   Still continues to meet inpatient criteria given cardiac workup being planned         Vitals:   07/11/22 1230 07/11/22 1245 07/11/22 1300 07/11/22 1347  BP: (!) 149/74 (!) 145/78 (!) 147/74 132/81  Pulse: (!) 56 (!) 57 (!) 59 (!) 58  Resp: 14 13 17 16   Temp:    97.6 F (36.4 C)  TempSrc:       SpO2: 96% 95% 95% 98%  Weight:      Height:        Author: Verline Lema, MD 07/11/2022 5:50 PM  For on call review [email or web address].

## 2022-07-11 NOTE — Progress Notes (Signed)
Progress Note  Patient Name: Tiffany Munoz Date of Encounter: 07/11/2022  Primary Cardiologist: New - consult by Agbor-Etang  Subjective   She underwent cardiac catheterization this morning with PCI and 2 drug-eluting stent placement to the left circumflex.  She had back discomfort after the procedure but she is feeling better.  No chest pain.  Inpatient Medications    Scheduled Meds:  amLODipine  5 mg Oral QPM   aspirin EC  81 mg Oral Daily   metoprolol succinate  25 mg Oral Daily   potassium chloride  10 mEq Oral Daily   rosuvastatin  40 mg Oral Daily   sodium chloride flush  3 mL Intravenous Q12H   ticagrelor  90 mg Oral BID   Continuous Infusions:  sodium chloride     sodium chloride 1 mL/kg/hr (07/11/22 1000)   PRN Meds: sodium chloride, acetaminophen, nitroGLYCERIN, ondansetron (ZOFRAN) IV, sodium chloride flush, traMADol   Vital Signs    Vitals:   07/11/22 1230 07/11/22 1245 07/11/22 1300 07/11/22 1347  BP: (!) 149/74 (!) 145/78 (!) 147/74 132/81  Pulse: (!) 56 (!) 57 (!) 59 (!) 58  Resp: 14 13 17 16   Temp:    97.6 F (36.4 C)  TempSrc:      SpO2: 96% 95% 95% 98%  Weight:      Height:        Intake/Output Summary (Last 24 hours) at 07/11/2022 1605 Last data filed at 07/11/2022 1224 Gross per 24 hour  Intake 1654.35 ml  Output --  Net 1654.35 ml    Filed Weights   07/09/22 0708 07/10/22 0518 07/11/22 0506  Weight: 50.8 kg 53.5 kg 54.1 kg    Telemetry    SR with rare PVC and artifact - Personally Reviewed  ECG    No new tracings - Personally Reviewed  Physical Exam   GEN: No acute distress.   Neck: No JVD. Cardiac: RRR, no murmurs, rubs, or gallops.  Respiratory: Clear to auscultation bilaterally.  GI: Soft, nontender, non-distended.   MS: No edema; No deformity. Neuro:  Alert and oriented x 3; Nonfocal.  Psych: Normal affect. Right radial pulse is normal with no hematoma  Labs    Chemistry Recent Labs  Lab 07/08/22 1512  07/09/22 0408  NA 139 141  K 3.7 3.6  CL 104 106  CO2 26 25  GLUCOSE 117* 87  BUN 18 19  CREATININE 0.79 0.77  CALCIUM 9.7 9.3  GFRNONAA >60 >60  ANIONGAP 9 10      Hematology Recent Labs  Lab 07/08/22 1512 07/09/22 0408 07/10/22 0449  WBC 10.5 9.6 7.1  RBC 4.76 4.84 4.44  HGB 14.3 14.5 13.3  HCT 44.3 44.0 41.0  MCV 93.1 90.9 92.3  MCH 30.0 30.0 30.0  MCHC 32.3 33.0 32.4  RDW 13.5 13.6 13.7  PLT 278 285 255     Cardiac EnzymesNo results for input(s): "TROPONINI" in the last 168 hours. No results for input(s): "TROPIPOC" in the last 168 hours.   BNPNo results for input(s): "BNP", "PROBNP" in the last 168 hours.   DDimer No results for input(s): "DDIMER" in the last 168 hours.   Radiology    CT Angio Chest Aorta W and/or Wo Contrast  Result Date: 07/08/2022 IMPRESSION: 1. Preferential opacification of the thoracic aorta somewhat limited by early contrast bolus and mixing artifact seen in the descending thoracic aorta. No evident acute dissection or other acute aortic pathology. Noncontrast CT phase was not performed, which somewhat limits evaluation  for intramural hematoma. 2. 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 3. 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 Recommend vascular consultation if not already obtained and follow-up imaging in 6 months. 4. Emphysema. Aortic Atherosclerosis (ICD10-I70.0) and Emphysema (ICD10-J43.9). Electronically Signed   By: Delanna Ahmadi M.D.   On: 07/08/2022 16:51   DG Chest 2 View  Result Date: 07/08/2022 IMPRESSION: No active cardiopulmonary disease. Electronically Signed   By: Marijo Conception M.D.   On: 07/08/2022 15:38    Cardiac Studies   2D echo 07/09/2022: 1. Left ventricular ejection  fraction, by estimation, is 55 to 60%. Left  ventricular ejection fraction by 2D MOD biplane is 66.1 %. The left  ventricle has normal function. The left ventricle has no regional wall  motion abnormalities. There is mild left  ventricular hypertrophy. Left ventricular diastolic parameters are  consistent with Grade I diastolic dysfunction (impaired relaxation).   2. Right ventricular systolic function is normal. The right ventricular  size is normal.   3. The mitral valve is normal in structure. No evidence of mitral valve  regurgitation.   4. The aortic valve is tricuspid. Aortic valve regurgitation is not  visualized. Aortic valve sclerosis is present, with no evidence of aortic  valve stenosis.   5. The inferior vena cava is normal in size with greater than 50%  respiratory variability, suggesting right atrial pressure of 3 mmHg.   Patient Profile     77 y.o. female with history of type B intramural hematoma, penetrating aortic ulceration, PAD, thoracic aortic aneurysm, infrarenal AAA, HTN, and ongoing tobacco use who is being seen today for the evaluation of elevated troponin at the request of Dr. Charleen Kirks.   Assessment & Plan    1. NSTEMI: -Currently chest pain-free -Echo with preserved LVSF and normal wall motion  -Left heart catheterization was done this morning via the right radial artery.  The culprit for myocardial infarction seems to be an occluded small posterior AV groove branch from the left circumflex with collaterals.  There was also significant distal and proximal stenosis in the left circumflex.  The LAD had moderate stenosis in the midsegment that was not significant by fractional flow reserve evaluation.  There was also small vessel disease.  I performed successful PCI and 2 drug-eluting stent placement to the left circumflex. No further revascularization is needed. Continue dual antiplatelet therapy for at least 12 months. She was referred to cardiac rehab.   2.   PAD/penetrating aortic ulceration/thoracic aortic aneurysm/infrarenal AAA: -Hemodynamically stable -Followed by [facility] vascular surgery -Aspirin and statin as above   3.  HLD: -LDL 128 this admission with goal being less than 55 -Now on titrated dose of rosuvastatin to 40 mg daily -May need to consider PCSK9 inhibitor   3.  Emphysema with ongoing tobacco use: -Complete cessation is recommended   4.  HTN: -Blood pressure currently well-controlled -Amlodipine  I added Toprol after PCI she is mildly bradycardic and thus will switch to small dose carvedilol.  The patient can likely be discharged home tomorrow if she remains stable.  For questions or updates, please contact [place] Please consult [email or web address] for contact info under Cardiology/STEMI.    Signed, Kathlyn Sacramento, MD [facility] 07/11/2022, 4:05 PM

## 2022-07-11 NOTE — Progress Notes (Signed)
Patient to cath lab.

## 2022-07-11 NOTE — Care Management Important Message (Signed)
Important Message  Patient Details  Name: Tiffany Munoz MRN: [id number] Date of Birth: 09/16/1945   Medicare Important Message Given:  Yes  Patient out of room upon time of visit, no family in room.  Copy of Medicare IM left on beside tray for reference.   Dannette Barbara 07/11/2022, 12:48 PM

## 2022-07-11 NOTE — Consult Note (Signed)
ANTICOAGULATION CONSULT NOTE  Pharmacy Consult for IV Heparin Indication: chest pain/ACS  Patient Measurements: Height: 5\' 5"  (165.1 cm) Weight: 54.1 kg (119 lb 4.3 oz) IBW/kg (Calculated) : 57 Heparin Dosing Weight: 49.4 kg  Labs: Recent Labs    07/08/22 1512 07/08/22 1723 07/08/22 1724 07/08/22 2014 07/09/22 0408 07/09/22 1339 07/09/22 2229 07/10/22 0446 07/10/22 0449 07/11/22 0622  HGB 14.3  --   --   --  14.5  --   --   --  13.3  --   HCT 44.3  --   --   --  44.0  --   --   --  41.0  --   PLT 278  --   --   --  285  --   --   --  255  --   APTT  --   --  34  --   --   --   --   --   --   --   LABPROT  --   --  14.6  --   --   --   --   --   --   --   INR  --   --  1.2  --   --   --   --   --   --   --   HEPARINUNFRC  --   --   --   --  0.16*   < > 0.56  --  0.49 0.55  CREATININE 0.79  --   --   --  0.77  --   --   --   --   --   TROPONINIHS 1,682* 2,096*  --  3,195*  --   --   --  1,536*  --   --    < > = values in this interval not displayed.    Estimated Creatinine Clearance: 51.9 mL/min (by C-G formula based on SCr of 0.77 mg/dL).  Medical History: Past Medical History:  Diagnosis Date   AAA (abdominal aortic aneurysm) (HCC)    Hypertension    Migraine    Medications:  No anticoagulation prior to admission per my chart review. Medication reconciliation is pending  Assessment: 77 y/o F with medical history as above presenting to the ED 3/29 with chest discomfort x 3 days radiating to back. Pharmacy consulted to initiate and manage heparin infusion for suspected ACS.  Baseline aPTT and PT-INR are pending. Baseline CBC within normal limits.  3/30:  HL @ 0408 = 0.16, SUBtherapeutic   inc drip to 800 u/hr 3/30:  HL @ 1339 = 0.27, SUBtherapeutic  inc drip from 800 to 900 u/hr 3/30:  HL @ 2229 = 0.56, therapeutic X 1 @ 900 un/hr  3/31:  HL @ 0449 = 0.49, therapeutic X 2 @ 900 un/hr 4/01:  HL @ 0622 = 0.55, therapeutic X 3 @ 900 un/hr   Goal of Therapy:   Heparin level 0.3-0.7 units/ml Monitor platelets by anticoagulation protocol: Yes   Plan:  4/01:  HL @ 0622 = 0.55, therapeutic X 3 @ 0900 un/hr - Will continue pt on current rate and recheck HL on 04/02 with AM labs. ----Daily CBC per protocol while on IV heparin  Tiffany Munoz D 07/11/2022,6:59 AM

## 2022-07-12 ENCOUNTER — Encounter: Payer: Self-pay | Admitting: Cardiovascular Disease

## 2022-07-12 DIAGNOSIS — I214 Non-ST elevation (NSTEMI) myocardial infarction: Secondary | ICD-10-CM | POA: Diagnosis not present

## 2022-07-12 LAB — CBC WITH DIFFERENTIAL/PLATELET
Abs Immature Granulocytes: 0.01 10*3/uL (ref 0.00–0.07)
Basophils Absolute: 0 10*3/uL (ref 0.0–0.1)
Basophils Relative: 0 %
Eosinophils Absolute: 0.2 10*3/uL (ref 0.0–0.5)
Eosinophils Relative: 2 %
HCT: 41 % (ref 36.0–46.0)
Hemoglobin: 13.5 g/dL (ref 12.0–15.0)
Immature Granulocytes: 0 %
Lymphocytes Relative: 16 %
Lymphs Abs: 1.4 10*3/uL (ref 0.7–4.0)
MCH: 29.8 pg (ref 26.0–34.0)
MCHC: 32.9 g/dL (ref 30.0–36.0)
MCV: 90.5 fL (ref 80.0–100.0)
Monocytes Absolute: 0.7 10*3/uL (ref 0.1–1.0)
Monocytes Relative: 8 %
Neutro Abs: 6.3 10*3/uL (ref 1.7–7.7)
Neutrophils Relative %: 74 %
Platelets: 250 10*3/uL (ref 150–400)
RBC: 4.53 MIL/uL (ref 3.87–5.11)
RDW: 13.6 % (ref 11.5–15.5)
WBC: 8.6 10*3/uL (ref 4.0–10.5)
nRBC: 0 % (ref 0.0–0.2)

## 2022-07-12 LAB — BASIC METABOLIC PANEL
Anion gap: 7 (ref 5–15)
BUN: 15 mg/dL (ref 8–23)
CO2: 24 mmol/L (ref 22–32)
Calcium: 9.5 mg/dL (ref 8.9–10.3)
Chloride: 105 mmol/L (ref 98–111)
Creatinine, Ser: 0.73 mg/dL (ref 0.44–1.00)
GFR, Estimated: >60 mL/min (ref >60)
Glucose, Bld: 93 mg/dL (ref 70–99)
Potassium: 3.7 mmol/L (ref 3.5–5.1)
Sodium: 136 mmol/L (ref 135–145)

## 2022-07-12 MED ORDER — AMLODIPINE BESYLATE 5 MG PO TABS: 5.0000 mg | ORAL_TABLET | Freq: Every evening | ORAL | 0 refills | Status: DC

## 2022-07-12 MED ORDER — ROSUVASTATIN CALCIUM 40 MG PO TABS: 40.0000 mg | ORAL_TABLET | Freq: Every day | ORAL | 2 refills | Status: DC

## 2022-07-12 MED ORDER — TICAGRELOR 90 MG PO TABS: 90.0000 mg | ORAL_TABLET | Freq: Two times a day (BID) | ORAL | 12 refills | Status: DC

## 2022-07-12 MED ORDER — NITROGLYCERIN 0.4 MG SL SUBL: 0.4000 mg | SUBLINGUAL_TABLET | SUBLINGUAL | 2 refills | Status: DC | PRN

## 2022-07-12 MED ORDER — CARVEDILOL 3.125 MG PO TABS: 3.1250 mg | ORAL_TABLET | Freq: Two times a day (BID) | ORAL | 2 refills | Status: DC

## 2022-07-12 NOTE — Progress Notes (Signed)
Progress Note  Patient Name: Tiffany Munoz Date of Encounter: 07/12/2022  Primary Cardiologist: New - consult by Agbor-Etang  Subjective   She underwent cardiac catheterization yesterday with PCI and 2 drug-eluting stent placement to the left circumflex.   She is feeling well with no chest pain or shortness of breath.  Inpatient Medications    Scheduled Meds:  amLODipine  5 mg Oral QPM   aspirin EC  81 mg Oral Daily   carvedilol  3.125 mg Oral BID WC   potassium chloride  10 mEq Oral Daily   rosuvastatin  40 mg Oral Daily   sodium chloride flush  3 mL Intravenous Q12H   ticagrelor  90 mg Oral BID   Continuous Infusions:  sodium chloride     PRN Meds: sodium chloride, acetaminophen, nitroGLYCERIN, ondansetron (ZOFRAN) IV, sodium chloride flush, traMADol   Vital Signs    Vitals:   07/11/22 2007 07/11/22 2324 07/12/22 0334 07/12/22 0835  BP: 131/83 (!) 141/78 136/82 134/67  Pulse: 62 62 67 70  Resp: 16 18 18 14   Temp: 97.7 F (36.5 C) 98 F (36.7 C) 98.2 F (36.8 C) 98.5 F (36.9 C)  TempSrc:      SpO2: 98% 98% 95% 96%  Weight:   50.3 kg   Height:        Intake/Output Summary (Last 24 hours) at 07/12/2022 0919 Last data filed at 07/11/2022 1914 Gross per 24 hour  Intake 1851.78 ml  Output --  Net 1851.78 ml    Filed Weights   07/10/22 0518 07/11/22 0506 07/12/22 0334  Weight: 53.5 kg 54.1 kg 50.3 kg    Telemetry    Normal sinus rhythm- Personally Reviewed  ECG    No new tracings - Personally Reviewed  Physical Exam   GEN: No acute distress.   Neck: No JVD. Cardiac: RRR, no murmurs, rubs, or gallops.  Respiratory: Clear to auscultation bilaterally.  GI: Soft, nontender, non-distended.   MS: No edema; No deformity. Neuro:  Alert and oriented x 3; Nonfocal.  Psych: Normal affect. Right radial pulse is normal with no hematoma  Labs    Chemistry Recent Labs  Lab 07/08/22 1512 07/09/22 0408 07/12/22 0438  NA 139 141 136  K 3.7 3.6 3.7  CL  104 106 105  CO2 26 25 24   GLUCOSE 117* 87 93  BUN 18 19 15   CREATININE 0.79 0.77 0.73  CALCIUM 9.7 9.3 9.5  GFRNONAA >60 >60 >60  ANIONGAP 9 10 7       Hematology Recent Labs  Lab 07/09/22 0408 07/10/22 0449 07/12/22 0438  WBC 9.6 7.1 8.6  RBC 4.84 4.44 4.53  HGB 14.5 13.3 13.5  HCT 44.0 41.0 41.0  MCV 90.9 92.3 90.5  MCH 30.0 30.0 29.8  MCHC 33.0 32.4 32.9  RDW 13.6 13.7 13.6  PLT 285 255 250     Cardiac EnzymesNo results for input(s): "TROPONINI" in the last 168 hours. No results for input(s): "TROPIPOC" in the last 168 hours.   BNPNo results for input(s): "BNP", "PROBNP" in the last 168 hours.   DDimer No results for input(s): "DDIMER" in the last 168 hours.   Radiology    CT Angio Chest Aorta W and/or Wo Contrast  Result Date: 07/08/2022 IMPRESSION: 1. Preferential opacification of the thoracic aorta somewhat limited by early contrast bolus and mixing artifact seen in the descending thoracic aorta. No evident acute dissection or other acute aortic pathology. Noncontrast CT phase was not performed, which somewhat limits evaluation  for intramural hematoma. 2. 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 3. 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 Recommend vascular consultation if not already obtained and follow-up imaging in 6 months. 4. Emphysema. Aortic Atherosclerosis (ICD10-I70.0) and Emphysema (ICD10-J43.9). Electronically Signed   By: Delanna Ahmadi M.D.   On: 07/08/2022 16:51   DG Chest 2 View  Result Date: 07/08/2022 IMPRESSION: No active cardiopulmonary disease. Electronically Signed   By: Marijo Conception M.D.   On: 07/08/2022 15:38    Cardiac Studies   2D echo 07/09/2022: 1. Left ventricular ejection fraction, by  estimation, is 55 to 60%. Left  ventricular ejection fraction by 2D MOD biplane is 66.1 %. The left  ventricle has normal function. The left ventricle has no regional wall  motion abnormalities. There is mild left  ventricular hypertrophy. Left ventricular diastolic parameters are  consistent with Grade I diastolic dysfunction (impaired relaxation).   2. Right ventricular systolic function is normal. The right ventricular  size is normal.   3. The mitral valve is normal in structure. No evidence of mitral valve  regurgitation.   4. The aortic valve is tricuspid. Aortic valve regurgitation is not  visualized. Aortic valve sclerosis is present, with no evidence of aortic  valve stenosis.   5. The inferior vena cava is normal in size with greater than 50%  respiratory variability, suggesting right atrial pressure of 3 mmHg.   Patient Profile     77 y.o. female with history of type B intramural hematoma, penetrating aortic ulceration, PAD, thoracic aortic aneurysm, infrarenal AAA, HTN, and ongoing tobacco use who is being seen today for the evaluation of elevated troponin at the request of Dr. Charleen Kirks.   Assessment & Plan    1. NSTEMI: -Echo with preserved LVSF and normal wall motion  -Left heart catheterization was done yesterday via the right radial artery.  The culprit for myocardial infarction was an occluded small posterior AV groove branch from the left circumflex with collaterals.  There was also significant distal and proximal stenosis in the left circumflex.  The LAD had moderate stenosis in the midsegment that was not significant by fractional flow reserve evaluation.  There was also small vessel disease.  I performed successful PCI and 2 drug-eluting stent placement to the left circumflex. No further revascularization is needed. Continue dual antiplatelet therapy for at least 12 months. She was referred to cardiac rehab.   2.  PAD/penetrating aortic ulceration/thoracic aortic  aneurysm/infrarenal AAA: -Hemodynamically stable -Followed by [facility] vascular surgery -Aspirin and statin as above   3.  HLD: -LDL 128 this admission with goal being less than 55 -Now on titrated dose of rosuvastatin to 40 mg daily -May need to consider PCSK9 inhibitor if LDL does not go down to target.   3.  Emphysema with ongoing tobacco use: -She reports smoking only few cigarettes daily.  I discussed with her the importance of complete cessation.   4.  HTN: -Blood pressure currently well-controlled I added small dose carvedilol given presentation with myocardial infarction.  Continue amlodipine.    The patient can be discharged home from a cardiac standpoint.  I will arrange for follow-up in our office in 1 to 2 weeks.  For questions or updates, please contact [place] Please consult [email or web address] for contact  info under Cardiology/STEMI.    Signed, Kathlyn Sacramento, MD [facility] 07/12/2022, 9:19 AM

## 2022-07-12 NOTE — Progress Notes (Signed)
Discussed discharge instruction with patient, including medications, follow up appointments, radial site care and blood pressure monitoring.    Stent card and home medication returned to patient. AVS sent home

## 2022-07-12 NOTE — Discharge Summary (Signed)
Physician Discharge Summary   Patient: Tiffany Munoz MRN: [id number] DOB: 03/14/1946  Admit date:     07/08/2022  Discharge date: 07/12/22  Discharge Physician: Verline Lema   PCP: Juluis Pitch, MD     Discharge Diagnoses: NSTEMI (non-ST elevated myocardial infarction) [facility]) Penetrating atherosclerotic ulcer of aorta ([place]) Benign essential hypertension Mixed hyperlipidemia  Hospital Course: Tiffany Munoz is a 77 y.o. 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 Tiffany Munoz states that on the evening of 07/06/2022, she developed chest pain when she was resting.     On arrival to the ED, patient was normotensive at 125/66 with heart rate of 64.  She was saturating at 95% on room air.  She was afebrile at 97.5.  Initial workup notable for normal CBC, BMP with potassium 3.7, glucose 117, creatinine 0.79 with GFR above 60.  Initial troponin elevated at 1682.EKG was obtained did not demonstrate any findings concerning for STEMI.  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  Cardiology consulted given concerns of NSTEMI.  Patient is s/p cardiac catheterization with 2 drug-eluting stent placement on 07/11/2022.  Been cleared for discharge today and to follow-up with cardiologist as an outpatient.  Patient has been encouraged to be compliant with her medications as well as dual antiplatelet therapy to prevent in-stent rethrombosis.    Consultants: Cardiology Procedures performed: Catheterization with PCI Disposition: Home Diet recommendation:  Discharge Diet Orders (From admission, onward)     Start     Ordered   07/12/22 0000  Diet -  low sodium heart healthy        07/12/22 1141           Cardiac diet DISCHARGE MEDICATION: Allergies as of 07/12/2022       Reactions   Wellbutrin [bupropion]    hives        Medication List     TAKE these medications    amLODipine 5 MG tablet Commonly known as: NORVASC Take 1 tablet (5 mg total) by mouth every evening. What changed:  medication strength how much to take when to take this   aspirin 81 MG tablet Take 1-2 tablets by mouth daily.   carvedilol 3.125 MG tablet Commonly known as: COREG Take 1 tablet (3.125 mg total) by mouth 2 (two) times daily with a meal.   clobetasol cream 0.05 % Commonly known as: TEMOVATE Apply 1 Application topically 3 (three) times daily as needed.   nitroGLYCERIN 0.4 MG SL tablet Commonly known as: NITROSTAT Place 1 tablet (0.4 mg total) under the tongue every 5 (five) minutes x 3 doses as needed for chest pain.   potassium chloride 10 MEQ tablet Commonly known as: KLOR-CON M Take 1 tablet by mouth daily.   PreviDent 1.1 % Gel dental gel Generic drug: sodium fluoride Take 1 application by mouth daily.   rosuvastatin 40 MG tablet Commonly known as: CRESTOR Take 1 tablet (40 mg total) by mouth daily. Start taking on: July 13, 2022 What changed:  medication strength how much to take when to take this additional instructions   ticagrelor 90 MG Tabs tablet Commonly known as: BRILINTA Take 1 tablet (90 mg total) by mouth 2 (two) times daily.  traMADol 50 MG tablet Commonly known as: ULTRAM Take 50 mg by mouth every 8 (eight) hours as needed for severe pain or moderate pain.   ZyrTEC Allergy 10 MG tablet Generic drug: cetirizine Take 1 tablet by mouth daily as needed for rhinitis or allergies.        Discharge Exam: Filed Weights   07/10/22 0518 07/11/22 0506 07/12/22 0334  Weight: 53.5 kg 54.1 kg 50.3 kg   Vitals and nursing note reviewed.  Constitutional:      General: She is not in acute  distress. HENT:     Head: Normocephalic and atraumatic.  Eyes:     Extraocular Movements: Extraocular movements intact.     Pupils: Pupils are equal, round, and reactive to light.  Neck:     Vascular: No JVD.  Cardiovascular:     Rate and Rhythm: Normal rate and regular rhythm.  Pulmonary:     Effort: Pulmonary effort is normal. No tachypnea or respiratory distress.  Abdominal:     General: Bowel sounds are normal. There is no distension.  Musculoskeletal:     Cervical back: Neck supple.   Condition at discharge: good   Discharge time spent: greater than 30 minutes.  Signed: Verline Lema, MD [facility] 07/12/2022

## 2022-07-30 NOTE — Progress Notes (Unsigned)
Cardiology Office Note    Date:  08/02/2022   ID:  Tiffany Munoz, DOB June 08, 1945, MRN [id number]  PCP:  Dorothey Baseman, MD  Cardiologist:  Debbe Odea, MD  Electrophysiologist:  None   Chief Complaint: Hospital follow-up  History of Present Illness:   Tiffany Munoz is a 77 y.o. 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  She was previously followed by Dr. Lady Gary.  Remote echo from [facility] in 2019 showed an EF of 65 to 70%, grade 1 diastolic dysfunction, degenerative mitral valve, moderate LVH, aortic sclerosis, and normal RV systolic function.  Nuclear stress testing in 2020 showed no evidence of ischemia or scar with an EF of 65%.  With regards to her PAD, aortic ulcerative disease, and AAA, she is followed by [facility] vascular surgery, last seeing them in 05/2022 with the largest thoracic aortic measurement noted to be 4.7 cm at that time and with an infrarenal abdominal aorta measuring 3.3 cm with recommendation for her to follow up with them in 11/2022.   She was admitted to [facility] on 3/29 through 07/12/2022 chest pressure at rest that radiated to her back and felt similar to what she experienced when she was diagnosed with her aortic disease.  High-sensitivity troponin peaked at 3195.  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  Echo showed an EF of 55 to 60%, no regional wall motion abnormalities, mild LVH, grade 1 diastolic dysfunction, normal RV systolic function and ventricular cavity size, aortic valve sclerosis without evidence of  stenosis, and an estimated right atrial pressure of 3 mmHg.  LHC showed the culprit vessel seem to be the LCx with an occluded posterior AV groove branch with collaterals as well as significant distal and proximal stenosis along with an aneurysmal vessel especially in the midsegment.  The LAD had moderate disease that was not significant by FFR.  D2 had significant disease, though was small in size.  The rPDA also had significant mid stenosis, though was small in size.  She underwent successful PCI/DES to the distal and proximal LCx.  She comes in doing well from a cardiac perspective and is without symptoms of angina or cardiac decompensation.  No dyspnea, dizziness, presyncope, or syncope.  She does have some baseline unsteadiness on her feet which is largely unchanged.  She did have 1 mechanical fall the day after her discharge, slipping with her bedroom slippers.  She did hit her nose on the floor with some noted epistaxis.  No residual neurological deficit.  She did not seek medical care at that time and declines head CT currently.  She has been adherent to ticagrelor without missing any doses.  Not currently taking aspirin.  She does have concerns regarding daily rosuvastatin secondary to prior history of myalgias, question myositis.  No right radial arteriotomy site complications.  No hematochezia or melena.  Interested in participating with cardiac rehab.  Has not needed any as needed SL NTG.   Labs independently reviewed: 07/2022 - Hgb 13.5, PLT 250, potassium 3.7, BUN 15, serum creatinine 0.73, A1c 5.1, TC 212, TG 83, HDL 67,  LDL 128, LP(a) 415 01/2022 - albumin 4.4, AST/ALT normal, TSH normal  Past Medical History:  Diagnosis Date   AAA (abdominal aortic aneurysm)    Hypertension    Migraine     Past Surgical History:  Procedure Laterality Date   CESAREAN SECTION     CORONARY PRESSURE/FFR STUDY N/A 07/11/2022   Procedure: INTRAVASCULAR PRESSURE WIRE/FFR STUDY;  Surgeon: Iran Ouch,  MD;  Location: [facility] INVASIVE CV LAB;  Service: Cardiovascular;  Laterality: N/A;   CORONARY STENT INTERVENTION N/A 07/11/2022   Procedure: CORONARY STENT INTERVENTION;  Surgeon: Iran Ouch, MD;  Location: [facility] INVASIVE CV LAB;  Service: Cardiovascular;  Laterality: N/A;   EXTERNAL EAR SURGERY     LEFT HEART CATH AND CORONARY ANGIOGRAPHY N/A 07/11/2022   Procedure: LEFT HEART CATH AND CORONARY ANGIOGRAPHY;  Surgeon: Iran Ouch, MD;  Location: [facility] INVASIVE CV LAB;  Service: Cardiovascular;  Laterality: N/A;   TYMPANOSTOMY TUBE PLACEMENT     UTERINE FIBROID SURGERY      Current Medications: Current Meds  Medication Sig   amLODipine (NORVASC) 5 MG tablet Take 1 tablet (5 mg total) by mouth every evening.   carvedilol (COREG) 3.125 MG tablet Take 1 tablet (3.125 mg total) by mouth 2 (two) times daily with a meal.   cetirizine (ZYRTEC ALLERGY) 10 MG tablet Take 1 tablet by mouth daily as needed for rhinitis or allergies.   clobetasol cream (TEMOVATE) 0.05 % Apply 1 Application topically 3 (three) times daily as needed.   Evolocumab (REPATHA SURECLICK) 140 MG/ML SOAJ Inject 140 mg into the skin every 14 (fourteen) days.   potassium chloride (K-DUR,KLOR-CON) 10 MEQ tablet Take 1 tablet by mouth daily.   rosuvastatin (CRESTOR) 40 MG tablet Take 1 tablet (40 mg total) by mouth daily.   ticagrelor (BRILINTA) 90 MG TABS tablet Take 1 tablet (90 mg total) by mouth 2 (two) times daily.   traMADol (ULTRAM) 50 MG tablet Take 50 mg by mouth every 8 (eight) hours as needed for severe pain or moderate pain.    Allergies:   Wellbutrin [bupropion]   Social History   Socioeconomic History   Marital status: Legally Separated    Spouse name: Not on file   Number of children: Not on file   Years of education: Not on file   Highest education level: Not on file  Occupational History   Not on file  Tobacco Use   Smoking status: Every Day    Types: Cigarettes   Smokeless tobacco: Never    Tobacco comments:    2 cigarettes a day  Vaping Use   Vaping Use: Never used  Substance and Sexual Activity   Alcohol use: Yes    Alcohol/week: 2.0 standard drinks of alcohol    Types: 2 Cans of beer per week   Drug use: Never   Sexual activity: Not on file  Other Topics Concern   Not on file  Social History Narrative   Not on file   Social Determinants of Health   Financial Resource Strain: Not on file  Food Insecurity: No Food Insecurity (07/08/2022)   Hunger Vital Sign    Worried About Running Out of Food in the Last Year: Never true    Ran Out of Food in the Last Year: Never true  Transportation Needs: No Transportation Needs (07/08/2022)   PRAPARE - Administrator, Civil Service (Medical): No    Lack of Transportation (Non-Medical): No  Physical Activity: Not on file  Stress: Not on file  Social Connections: Not on file     Family History:  The patient's family history includes Cancer in her brother; Gout in her brother; Heart disease in her father and mother; Hemochromatosis in her brother; Hypertension in her brother; Stroke in her sister.  ROS:   12-point review of systems is negative unless otherwise noted in the HPI.   EKGs/Labs/Other Studies Reviewed:    Studies reviewed were summarized above. The additional studies were reviewed today:  2D echo 07/09/2022: 1. Left ventricular ejection fraction, by estimation, is 55 to 60%. Left  ventricular ejection fraction by 2D MOD biplane is 66.1 %. The left  ventricle has normal function. The left ventricle has no regional wall  motion abnormalities. There is mild left  ventricular hypertrophy. Left ventricular diastolic parameters are  consistent with Grade I diastolic dysfunction (impaired relaxation).   2. Right ventricular systolic function is normal. The right ventricular  size is normal.   3. The mitral valve is normal in structure. No evidence of mitral valve  regurgitation.   4. The aortic valve is  tricuspid. Aortic valve regurgitation is not  visualized. Aortic valve sclerosis is present, with no evidence of aortic  valve stenosis.   5. The inferior vena cava is normal in size with greater than 50%  respiratory variability, suggesting right atrial pressure of 3 mmHg.  __________  LHC 07/11/2022:   Prox LAD lesion is 30% stenosed.   Mid LAD lesion is 50% stenosed.   Mid Cx to Dist Cx lesion is 95% stenosed.   Prox Cx to Mid Cx lesion is 85% stenosed.   Prox RCA lesion is 30% stenosed.   Mid RCA lesion is 30% stenosed.   Dist RCA lesion is 60% stenosed.   RPDA lesion is 70% stenosed.   2nd Diag lesion is 80% stenosed.   LPAV lesion is 100% stenosed.   Mid Cx lesion is 30% stenosed.   A drug-eluting stent was successfully placed using a STENT ONYX FRONTIER 2.25X15.   A drug-eluting stent was successfully placed using a STENT ONYX FRONTIER 4.0X12.   Post intervention, there is a 0% residual stenosis.   Post intervention, there is a 0% residual stenosis.   The left ventricular systolic function is normal.   LV end diastolic pressure is normal.   The left ventricular ejection fraction is 55-65% by visual estimate.   1.  Non-ST elevation myocardial infarction.  The culprit seems to be the left circumflex with an occluded small posterior AV groove branch with collaterals, significant distal and proximal stenosis and an aneurysmal vessel especially in the midsegment.  The LAD has moderate disease that was not significant by fractional flow reserve evaluation.  The second diagonal has significant stenosis but small in size.  The right PDA also has significant mid stenosis but small in size. 2.  Normal LV systolic function and normal left ventricular end-diastolic pressure. 3.  Successful angioplasty and drug-eluting stent placement to the distal and proximal left circumflex.   Recommendations: Dual antiplatelet therapy for 12 months. Aggressive treatment of risk factors. Treat small  vessel disease medically.   EKG:  EKG is ordered today.  The EKG ordered today demonstrates NSR, 61 bpm, no acute ST-T changes  Recent Labs: 07/12/2022: BUN 15; Creatinine, Ser 0.73; Hemoglobin 13.5; Platelets 250; Potassium 3.7; Sodium 136  Recent Lipid Panel    Component Value Date/Time   CHOL 212 (H) 07/09/2022 0408   TRIG 83 07/09/2022 0408   HDL  67 07/09/2022 0408   CHOLHDL 3.2 07/09/2022 0408   VLDL 17 07/09/2022 0408   LDLCALC 128 (H) 07/09/2022 0408    PHYSICAL EXAM:    VS:  BP 130/68 (BP Location: Left Arm, Patient Position: Sitting, Cuff Size: Normal)   Pulse 61   Ht 5\' 5"  (1.651 m)   Wt 112 lb 9.6 oz (51.1 kg)   SpO2 98%   BMI 18.74 kg/m   BMI: Body mass index is 18.74 kg/m.  Physical Exam Vitals reviewed.  Constitutional:      Appearance: She is well-developed.  HENT:     Head: Normocephalic and atraumatic.  Eyes:     General:        Right eye: No discharge.        Left eye: No discharge.  Neck:     Vascular: No JVD.  Cardiovascular:     Rate and Rhythm: Normal rate and regular rhythm.     Pulses:          Posterior tibial pulses are 2+ on the right side and 2+ on the left side.     Heart sounds: Normal heart sounds, S1 normal and S2 normal. Heart sounds not distant. No midsystolic click and no opening snap. No murmur heard.    No friction rub.     Comments: Right radial arteriotomy site without active bleeding, bruising, warmth, erythema, or tenderness to palpation.  Radial pulse 2+ proximal and distal to the arterial site.  Small knot is noted along the arteriotomy site. Pulmonary:     Effort: Pulmonary effort is normal. No respiratory distress.     Breath sounds: Normal breath sounds. No decreased breath sounds, wheezing or rales.  Chest:     Chest wall: No tenderness.  Abdominal:     General: There is no distension.  Musculoskeletal:     Cervical back: Normal range of motion.     Right lower leg: No edema.     Left lower leg: No edema.  Skin:     General: Skin is warm and dry.     Nails: There is no clubbing.  Neurological:     Mental Status: She is alert and oriented to person, place, and time.  Psychiatric:        Speech: Speech normal.        Behavior: Behavior normal.        Thought Content: Thought content normal.        Judgment: Judgment normal.     Wt Readings from Last 3 Encounters:  08/02/22 112 lb 9.6 oz (51.1 kg)  07/12/22 110 lb 14.4 oz (50.3 kg)  11/25/19 111 lb (50.3 kg)     ASSESSMENT & PLAN:   CAD involving the native coronary arteries with NSTEMI without angina: She is doing very well and without symptoms concerning for angina or cardiac decompensation.  Continue DAPT with aspirin and ticagrelor without interruption for a minimum of 12 months.  She will begin aspirin 81 mg daily at this time.   Okay to participate with cardiac rehab.  We are transitioning her from rosuvastatin to PCSK9 inhibitor as outlined below.  No indication for further ischemic testing at this time.  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  Followed by [facility] vascular surgery.  Initiating aspirin and PCSK9 inhibitor as outlined above.  HLD: LDL 128 during recent admission with goal being less than 55.  She reports a history of myalgias with possible myositis on a daily statin therapy, though  was tolerating statin use 3 days/week.  However, I do not believe this will allow [place] to achieve a target LDL of less than 55.  In this setting, and in the context of LP(a) of 415, we will transition her from rosuvastatin to PCSK9 inhibitor.   HTN: Blood pressure is reasonably controlled in the office today.  She remains on low-dose carvedilol.    Disposition: F/u with Dr. Azucena Cecil or an APP in 3 months.   Medication Adjustments/Labs and Tests Ordered: Current medicines are reviewed at length with the patient today.  Concerns regarding medicines are outlined above. Medication changes, Labs  and Tests ordered today are summarized above and listed in the Patient Instructions accessible in Encounters.   Signed, Eula Listen, PA-C 08/02/2022 3:45 PM     [facility] [place] [address] [place], [place] [address] [phone]

## 2022-08-02 ENCOUNTER — Ambulatory Visit: Payer: Medicare Other | Attending: Physician Assistant | Admitting: Physician Assistant

## 2022-08-02 ENCOUNTER — Encounter: Payer: Self-pay | Admitting: Physician Assistant

## 2022-08-02 VITALS — BP 130/68 | HR 61 | Ht 65.0 in | Wt 112.6 lb

## 2022-08-02 DIAGNOSIS — I7143 Infrarenal abdominal aortic aneurysm, without rupture: Secondary | ICD-10-CM

## 2022-08-02 DIAGNOSIS — I214 Non-ST elevation (NSTEMI) myocardial infarction: Secondary | ICD-10-CM | POA: Insufficient documentation

## 2022-08-02 DIAGNOSIS — I719 Aortic aneurysm of unspecified site, without rupture: Secondary | ICD-10-CM | POA: Diagnosis present

## 2022-08-02 DIAGNOSIS — I2510 Atherosclerotic heart disease of native coronary artery without angina pectoris: Secondary | ICD-10-CM | POA: Diagnosis present

## 2022-08-02 DIAGNOSIS — J449 Chronic obstructive pulmonary disease, unspecified: Secondary | ICD-10-CM

## 2022-08-02 DIAGNOSIS — I712 Thoracic aortic aneurysm, without rupture, unspecified: Secondary | ICD-10-CM | POA: Diagnosis present

## 2022-08-02 DIAGNOSIS — E785 Hyperlipidemia, unspecified: Secondary | ICD-10-CM | POA: Insufficient documentation

## 2022-08-02 DIAGNOSIS — Z720 Tobacco use: Secondary | ICD-10-CM

## 2022-08-02 DIAGNOSIS — I700 Atherosclerosis of aorta: Secondary | ICD-10-CM

## 2022-08-02 DIAGNOSIS — I10 Essential (primary) hypertension: Secondary | ICD-10-CM | POA: Insufficient documentation

## 2022-08-02 DIAGNOSIS — I739 Peripheral vascular disease, unspecified: Secondary | ICD-10-CM | POA: Diagnosis present

## 2022-08-02 MED ORDER — REPATHA SURECLICK 140 MG/ML ~~LOC~~ SOAJ: 140.0000 mg | SUBCUTANEOUS | 2 refills | Status: DC

## 2022-08-02 NOTE — Patient Instructions (Addendum)
Medication Instructions:  Your physician has recommended you make the following change in your medication:   START Repatha 140 mg every 2 weeks. Once you start this STOP rosuvastatin (Crestor).  RESTART Aspirin 81 mg once daily   *If you need a refill on your cardiac medications before your next appointment, please call your pharmacy*   Lab Work: None  If you have labs (blood work) drawn today and your tests are completely normal, you will receive your results only by: MyChart Message (if you have MyChart) OR A paper copy in the mail If you have any lab test that is abnormal or we need to change your treatment, we will call you to review the results.   Testing/Procedures: None   Follow-Up: At [facility], you and your health needs are our priority.  As part of our continuing mission to provide you with exceptional heart care, we have created designated Provider Care Teams.  These Care Teams include your primary Cardiologist (physician) and Advanced Practice Providers (APPs -  Physician Assistants and Nurse Practitioners) who all work together to provide you with the care you need, when you need it.   Your next appointment:   3 month(s)  Provider:   Debbe Odea, MD or Eula Listen, PA-C

## 2022-08-04 ENCOUNTER — Other Ambulatory Visit (HOSPITAL_COMMUNITY): Payer: Self-pay

## 2022-08-04 ENCOUNTER — Telehealth: Payer: Self-pay | Admitting: Cardiology

## 2022-08-04 NOTE — Telephone Encounter (Signed)
Pt c/o medication issue:  1. Name of Medication: Evolocumab (REPATHA SURECLICK) 140 MG/ML SOAJ   2. How are you currently taking this medication (dosage and times per day)? Inject 140 mg into the skin every 14 (fourteen) days.   3. Are you having a reaction (difficulty breathing--STAT)? No   4. What is your medication issue? [facility] is calling to get prior authorization for medication

## 2022-08-05 ENCOUNTER — Telehealth: Payer: Self-pay

## 2022-08-05 ENCOUNTER — Other Ambulatory Visit (HOSPITAL_COMMUNITY): Payer: Self-pay

## 2022-08-05 NOTE — Telephone Encounter (Signed)
Initiated, please see separate encounter with updates on determination 

## 2022-08-05 NOTE — Telephone Encounter (Signed)
Pharmacy Patient Advocate Encounter   Received notification from [facility] that prior authorization for REPATHA is needed.    PA submitted on 08/05/22 VIA BLUE E  Status is pending  Haze Rushing, CPhT Pharmacy Patient Advocate Specialist Direct Number: [phone] Fax: [phone]

## 2022-08-06 ENCOUNTER — Telehealth: Payer: Self-pay | Admitting: Cardiology

## 2022-08-06 NOTE — Telephone Encounter (Signed)
Pt woke today with black eye, no trauma and believes related to Brilinta and ASA. - she has had some spontaneous bleeds in past on arm.  I asked her to continue the brilinta and ASA and to be seen in ER or urgent care.  She doubts she would do this but is aware if headache, dizziness or other issues occur to go to ER.    Will defer to Dr. Azucena Cecil and Eula Listen, PA-C for possible switch to Plavix from  Brilinta is warranted.

## 2022-08-08 ENCOUNTER — Telehealth: Payer: Self-pay | Admitting: Cardiology

## 2022-08-08 NOTE — Telephone Encounter (Signed)
Request is still pending.

## 2022-08-08 NOTE — Telephone Encounter (Signed)
Noted- PA still pending as of this morning.

## 2022-08-08 NOTE — Telephone Encounter (Signed)
Spoke with patient and informed her that the prior authorization team stated the PA is still pending. Patient stated she was just checking.

## 2022-08-08 NOTE — Telephone Encounter (Signed)
Dr. Kirke Corin, are you ok if we transition her from Brilinta to Plavix with loading dose given patient's concerns?

## 2022-08-08 NOTE — Telephone Encounter (Signed)
Pt c/o medication issue:  1. Name of Medication: Evolocumab (REPATHA SURECLICK) 140 MG/ML SOAJ   2. How are you currently taking this medication (dosage and times per day)? Inject 140 mg into the skin every 14 (fourteen) days.   3. Are you having a reaction (difficulty breathing--STAT)? No  4. What is your medication issue? [facility] called to check on status of PA sent in on 08/02/2022 and would like a callback if and when possible. Please advise.

## 2022-08-09 ENCOUNTER — Other Ambulatory Visit (HOSPITAL_COMMUNITY): Payer: Self-pay

## 2022-08-11 NOTE — Telephone Encounter (Signed)
Pharmacy Patient Advocate Encounter  Prior Authorization for REPATHA has been approved.    Effective dates: 08/05/22 through 08/05/23  Romell Cavanah, CPhT Pharmacy Patient Advocate Specialist Direct Number: [phone] Fax: [phone] 

## 2022-08-11 NOTE — Telephone Encounter (Signed)
I agree with switching to Plavix.

## 2022-08-12 ENCOUNTER — Other Ambulatory Visit: Payer: Self-pay | Admitting: Physician Assistant

## 2022-08-12 MED ORDER — CLOPIDOGREL BISULFATE 75 MG PO TABS: ORAL_TABLET | ORAL | 2 refills | Status: AC

## 2022-08-12 MED ORDER — AMLODIPINE BESYLATE 5 MG PO TABS: 5.0000 mg | ORAL_TABLET | Freq: Every evening | ORAL | 0 refills | Status: DC

## 2022-08-12 NOTE — Addendum Note (Signed)
Addended by: Bryna Colander on: 08/12/2022 02:08 PM   Modules accepted: Orders

## 2022-08-12 NOTE — Telephone Encounter (Signed)
Please advise the patient interventional cardiology has weighed in on the patient's antiplatelet therapy.  Discontinue ticagrelor.  Start clopidogrel 300 mg x 1 dose, followed by 75 mg daily the next day.

## 2022-08-12 NOTE — Telephone Encounter (Signed)
Spoke with patient and reviewed provider recommendations to continue the amlodipine. Refill has been sent into her pharmacy as well. She was appreciative for the update and call back. No further needs.

## 2022-08-12 NOTE — Telephone Encounter (Signed)
Yes

## 2022-08-12 NOTE — Telephone Encounter (Signed)
Spoke with patient and reviewed instructions for transitioning to Plavix. She read back instructions. She then inquired about the amlodipine they gave her at discharge and wanted to know if she should continue to take that. She reports they only gave her 30 day supply with no refills. I did not see it listed on her medication list but I do see it on her discharge summary. Advised patient that I would forward to provider for his review and will be in touch with the recommendations. He verbalized understanding with no further questions at this time.

## 2022-08-12 NOTE — Addendum Note (Signed)
Addended by: Bryna Colander on: 08/12/2022 12:47 PM   Modules accepted: Orders

## 2022-08-19 ENCOUNTER — Telehealth: Payer: Self-pay | Admitting: Cardiology

## 2022-08-19 MED ORDER — REPATHA SURECLICK 140 MG/ML ~~LOC~~ SOAJ: 140.0000 mg | SUBCUTANEOUS | 2 refills | Status: DC

## 2022-08-19 NOTE — Telephone Encounter (Signed)
Spoke with pharmacy staff and they stated that they never received an order for the Evolocumab (REPATHA SURECLICK) 140 MG/ML SOAJ. Resent the order, pharmacy will call patient to pick up.

## 2022-08-19 NOTE — Telephone Encounter (Signed)
Pt c/o medication issue:  1. Name of Medication: Evolocumab [facility] SURECLICK) 140 MG/ML SOAJ [[id number]]   2. How are you currently taking this medication (dosage and times per day)? 140 mg  3. Are you having a reaction (difficulty breathing--STAT)? No   4. What is your medication issue? Pt stated that Her pharmacy did not let her know that this med was ready and they must have restocked it.  She stated they would not let her pick it up with it being called in again.  This was sent last month by Eula Listen

## 2022-08-24 ENCOUNTER — Encounter: Payer: Medicare Other | Attending: Cardiovascular Disease | Admitting: *Deleted

## 2022-08-24 ENCOUNTER — Encounter: Payer: Self-pay | Admitting: *Deleted

## 2022-08-24 DIAGNOSIS — Z48812 Encounter for surgical aftercare following surgery on the circulatory system: Secondary | ICD-10-CM | POA: Insufficient documentation

## 2022-08-24 DIAGNOSIS — Z955 Presence of coronary angioplasty implant and graft: Secondary | ICD-10-CM | POA: Insufficient documentation

## 2022-08-24 DIAGNOSIS — I214 Non-ST elevation (NSTEMI) myocardial infarction: Secondary | ICD-10-CM

## 2022-08-24 DIAGNOSIS — I252 Old myocardial infarction: Secondary | ICD-10-CM | POA: Insufficient documentation

## 2022-08-24 NOTE — Progress Notes (Signed)
Virtual orientation call completed today. shehas an appointment on Date: 08/29/2022  for EP eval and gym Orientation.  Documentation of diagnosis can be found in [facility]  Date: 07/08/2022 .  Tiffany Munoz is a current tobacco user. Intervention for tobacco cessation was provided at the initial medical review. She was asked about readiness to quit and reported that she is down to 2 cigarettes a day from a pack. She is working towards cessation. No quit date gas been sedt.  . Patient was advised and educated about tobacco cessation using combination therapy, tobacco cessation classes, quit line, and quit smoking apps. Patient demonstrated understanding of this material. Staff will continue to provide encouragement and follow up with the patient throughout the program.

## 2022-08-29 ENCOUNTER — Encounter: Payer: Medicare Other | Admitting: *Deleted

## 2022-08-29 VITALS — Ht 65.0 in | Wt 109.5 lb

## 2022-08-29 DIAGNOSIS — I252 Old myocardial infarction: Secondary | ICD-10-CM | POA: Diagnosis present

## 2022-08-29 DIAGNOSIS — Z955 Presence of coronary angioplasty implant and graft: Secondary | ICD-10-CM

## 2022-08-29 DIAGNOSIS — I214 Non-ST elevation (NSTEMI) myocardial infarction: Secondary | ICD-10-CM

## 2022-08-29 DIAGNOSIS — Z48812 Encounter for surgical aftercare following surgery on the circulatory system: Secondary | ICD-10-CM | POA: Diagnosis not present

## 2022-08-29 NOTE — Patient Instructions (Signed)
Patient Instructions  Patient Details  Name: Tiffany Munoz MRN: [id number] Date of Birth: 01-13-1946 Referring Provider:  Iran Ouch, MD  Below are your personal goals for exercise, nutrition, and risk factors. Our goal is to help you stay on track towards obtaining and maintaining these goals. We will be discussing your progress on these goals with you throughout the program.  Initial Exercise Prescription:  Initial Exercise Prescription - 08/29/22 1700       Date of Initial Exercise RX and Referring Provider   Date 08/29/22    Referring Provider Arida      Oxygen   Maintain Oxygen Saturation 88% or higher      Treadmill   MPH 1.5    Grade 0.5    Minutes 15    METs 2.33      NuStep   Level 2    SPM 80    Minutes 15    METs 2.33      Recumbant Elliptical   Level 1    RPM 50    Minutes 15    METs 2.33      Biostep-RELP   Level 1    SPM 50    Minutes 15    METs 2.33      Track   Laps 25    Minutes 15    METs 2.36      Prescription Details   Frequency (times per week) 3    Duration Progress to 30 minutes of continuous aerobic without signs/symptoms of physical distress      Intensity   THRR 40-80% of Max Heartrate 98-129    Ratings of Perceived Exertion 11-13    Perceived Dyspnea 0-4      Progression   Progression Continue to progress workloads to maintain intensity without signs/symptoms of physical distress.      Resistance Training   Training Prescription Yes    Weight 2    Reps 10-15             Exercise Goals: Frequency: Be able to perform aerobic exercise two to three times per week in program working toward 2-5 days per week of home exercise.  Intensity: Work with a perceived exertion of 11 (fairly light) - 15 (hard) while following your exercise prescription.  We will make changes to your prescription with you as you progress through the program.   Duration: Be able to do 30 to 45 minutes of continuous aerobic exercise in  addition to a 5 minute warm-up and a 5 minute cool-down routine.   Nutrition Goals: Your personal nutrition goals will be established when you do your nutrition analysis with the dietician.  The following are general nutrition guidelines to follow: Cholesterol < 200mg /day Sodium < 1500mg /day Fiber: Women over 50 yrs - 21 grams per day  Personal Goals:  Personal Goals and Risk Factors at Admission - 08/29/22 1728       Core Components/Risk Factors/Patient Goals on Admission    Weight Management Yes;Weight Gain    Intervention Weight Management: Develop a combined nutrition and exercise program designed to reach desired caloric intake, while maintaining appropriate intake of nutrient and fiber, sodium and fats, and appropriate energy expenditure required for the weight goal.;Weight Management: Provide education and appropriate resources to help participant work on and attain dietary goals.    Admit Weight 109 lb 8 oz (49.7 kg)    Goal Weight: Short Term 115 lb (52.2 kg)    Goal Weight: Long Term 115 lb (52.2  kg)    Expected Outcomes Short Term: Continue to assess and modify interventions until short term weight is achieved;Long Term: Adherence to nutrition and physical activity/exercise program aimed toward attainment of established weight goal;Weight Gain: Understanding of general recommendations for a high calorie, high protein meal plan that promotes weight gain by distributing calorie intake throughout the day with the consumption for 4-5 meals, snacks, and/or supplements;Understanding recommendations for meals to include 15-35% energy as protein, 25-35% energy from fat, 35-60% energy from carbohydrates, less than 200mg  of dietary cholesterol, 20-35 gm of total fiber daily;Understanding of distribution of calorie intake throughout the day with the consumption of 4-5 meals/snacks    Tobacco Cessation Yes    Number of packs per day 4 cigarettes a day  working towards cessation.    Tiffany Munoz is a  current tobacco user. Intervention for tobacco cessation was provided at the initial medical review. She was asked about readiness to quit and reported that she is down to 2 cigarettes a day from a pack. She is working towards cessation. No quit date gas been sedt.  . Patient was advised and educated about tobacco cessation using combination therapy, tobacco cessation classes, quit line, and quit smoking apps. Patient demonstrated understanding of this material. Staff will continue to provide encouragement and follow up with the patient throughout the program.    Intervention Assist the participant in steps to quit. Provide individualized education and counseling about committing to Tobacco Cessation, relapse prevention, and pharmacological support that can be provided by physician.;Education officer, environmental, assist with locating and accessing local/national Quit Smoking programs, and support quit date choice.    Expected Outcomes Short Term: Will demonstrate readiness to quit, by selecting a quit date.;Short Term: Will quit all tobacco product use, adhering to prevention of relapse plan.;Long Term: Complete abstinence from all tobacco products for at least 12 months from quit date.    Hypertension Yes    Intervention Provide education on lifestyle modifcations including regular physical activity/exercise, weight management, moderate sodium restriction and increased consumption of fresh fruit, vegetables, and low fat dairy, alcohol moderation, and smoking cessation.;Monitor prescription use compliance.    Expected Outcomes Short Term: Continued assessment and intervention until BP is < 140/8mm HG in hypertensive participants. < 130/54mm HG in hypertensive participants with diabetes, heart failure or chronic kidney disease.;Long Term: Maintenance of blood pressure at goal levels.    Lipids Yes    Intervention Provide education and support for participant on nutrition & aerobic/resistive exercise along  with prescribed medications to achieve LDL 70mg , HDL >40mg .    Expected Outcomes Short Term: Participant states understanding of desired cholesterol values and is compliant with medications prescribed. Participant is following exercise prescription and nutrition guidelines.;Long Term: Cholesterol controlled with medications as prescribed, with individualized exercise RX and with personalized nutrition plan. Value goals: LDL < 70mg , HDL > 40 mg.             Tobacco Use Initial Evaluation: Social History   Tobacco Use  Smoking Status Every Day   Packs/day: 0.50   Years: 55.00   Additional pack years: 0.00   Total pack years: 27.50   Types: Cigarettes  Smokeless Tobacco Never  Tobacco Comments   2 cigarettes a day working toward cessation    Exercise Goals and Review:  Exercise Goals     Row Name 08/29/22 1727             Exercise Goals   Increase Physical Activity Yes  Intervention Provide advice, education, support and counseling about physical activity/exercise needs.;Develop an individualized exercise prescription for aerobic and resistive training based on initial evaluation findings, risk stratification, comorbidities and participant's personal goals.       Expected Outcomes Short Term: Attend rehab on a regular basis to increase amount of physical activity.;Long Term: Add in home exercise to make exercise part of routine and to increase amount of physical activity.;Long Term: Exercising regularly at least 3-5 days a week.       Increase Strength and Stamina Yes       Intervention Provide advice, education, support and counseling about physical activity/exercise needs.;Develop an individualized exercise prescription for aerobic and resistive training based on initial evaluation findings, risk stratification, comorbidities and participant's personal goals.       Expected Outcomes Short Term: Increase workloads from initial exercise prescription for resistance, speed,  and METs.;Short Term: Perform resistance training exercises routinely during rehab and add in resistance training at home;Long Term: Improve cardiorespiratory fitness, muscular endurance and strength as measured by increased METs and functional capacity ( )       Able to understand and use rate of perceived exertion (RPE) scale Yes       Intervention Provide education and explanation on how to use RPE scale       Expected Outcomes Short Term: Able to use RPE daily in rehab to express subjective intensity level;Long Term:  Able to use RPE to guide intensity level when exercising independently       Able to understand and use Dyspnea scale Yes       Intervention Provide education and explanation on how to use Dyspnea scale       Expected Outcomes Short Term: Able to use Dyspnea scale daily in rehab to express subjective sense of shortness of breath during exertion;Long Term: Able to use Dyspnea scale to guide intensity level when exercising independently       Knowledge and understanding of Target Heart Rate Range (THRR) Yes       Intervention Provide education and explanation of THRR including how the numbers were predicted and where they are located for reference       Expected Outcomes Short Term: Able to state/look up THRR;Long Term: Able to use THRR to govern intensity when exercising independently;Short Term: Able to use daily as guideline for intensity in rehab       Able to check pulse independently Yes       Intervention Provide education and demonstration on how to check pulse in carotid and radial arteries.;Review the importance of being able to check your own pulse for safety during independent exercise       Expected Outcomes Short Term: Able to explain why pulse checking is important during independent exercise;Long Term: Able to check pulse independently and accurately       Understanding of Exercise Prescription Yes       Intervention Provide education, explanation, and written  materials on patient's individual exercise prescription       Expected Outcomes Short Term: Able to explain program exercise prescription;Long Term: Able to explain home exercise prescription to exercise independently                Copy of goals given to participant.

## 2022-08-29 NOTE — Progress Notes (Signed)
Cardiac Individual Treatment Plan  Patient Details  Name: Tiffany Munoz MRN: [id number] Date of Birth: Aug 30, 1945 Referring Provider:   Flowsheet Row Cardiac Rehab from 08/29/2022 in [facility] Cardiac and Pulmonary Rehab  Referring Provider Arida       Initial Encounter Date:  Flowsheet Row Cardiac Rehab from 08/29/2022 in [facility] Cardiac and Pulmonary Rehab  Date 08/29/22       Visit Diagnosis: NSTEMI (non-ST elevation myocardial infarction) [facility])  Status post coronary artery stent placement  Patient's Home Medications on Admission:  Current Outpatient Medications:    amLODipine (NORVASC) 5 MG tablet, Take 1 tablet (5 mg total) by mouth every evening., Disp: 30 tablet, Rfl: 0   aspirin 81 MG tablet, Take 1-2 tablets by mouth daily., Disp: , Rfl:    carvedilol (COREG) 3.125 MG tablet, Take 1 tablet (3.125 mg total) by mouth 2 (two) times daily with a meal., Disp: 30 tablet, Rfl: 2   cetirizine (ZYRTEC ALLERGY) 10 MG tablet, Take 1 tablet by mouth daily as needed for rhinitis or allergies., Disp: , Rfl:    clobetasol cream (TEMOVATE) 0.05 %, Apply 1 Application topically 3 (three) times daily as needed., Disp: , Rfl:    clopidogrel (PLAVIX) 75 MG tablet, Take 4 tablets (300 mg total) by mouth as directed for 1 day, THEN 1 tablet (75 mg total) daily., Disp: 34 tablet, Rfl: 2   Evolocumab (REPATHA SURECLICK) 140 MG/ML SOAJ, Inject 140 mg into the skin every 14 (fourteen) days., Disp: 2 mL, Rfl: 2   nitroGLYCERIN (NITROSTAT) 0.4 MG SL tablet, Place 1 tablet (0.4 mg total) under the tongue every 5 (five) minutes x 3 doses as needed for chest pain., Disp: 30 tablet, Rfl: 2   potassium chloride (K-DUR,KLOR-CON) 10 MEQ tablet, Take 1 tablet by mouth daily., Disp: , Rfl:    rosuvastatin (CRESTOR) 40 MG tablet, Take 1 tablet (40 mg total) by mouth daily. (Patient not taking: Reported on 08/24/2022), Disp: 30 tablet, Rfl: 2   sodium fluoride (PREVIDENT) 1.1 % GEL dental gel, Take 1 application by mouth  daily. (Patient not taking: Reported on 08/02/2022), Disp: , Rfl:    traMADol (ULTRAM) 50 MG tablet, Take 50 mg by mouth every 8 (eight) hours as needed for severe pain or moderate pain., Disp: , Rfl:   Past Medical History: Past Medical History:  Diagnosis Date   AAA (abdominal aortic aneurysm) (HCC)    Hypertension    Migraine     Tobacco Use: Social History   Tobacco Use  Smoking Status Every Day   Packs/day: 0.50   Years: 55.00   Additional pack years: 0.00   Total pack years: 27.50   Types: Cigarettes  Smokeless Tobacco Never  Tobacco Comments   2 cigarettes a day working toward cessation    Labs: Review Flowsheet       Latest Ref Rng & Units 03/29/2018 07/09/2022  Labs for ITP Cardiac and Pulmonary Rehab  Cholestrol 0 - 200 mg/dL - 409   LDL (calc) 0 - 99 mg/dL - 811   HDL-C >91 mg/dL - 67   Trlycerides <478 mg/dL - 83   Hemoglobin G9F 4.8 - 5.6 % - 5.1   TCO2 22 - 32 mmol/L 31  -     Exercise Target Goals: Exercise Program Goal: Individual exercise prescription set using results from initial 6 min walk test and THRR while considering  patient's activity barriers and safety.   Exercise Prescription Goal: Initial exercise prescription builds to 30-45 minutes a day  of aerobic activity, 2-3 days per week.  Home exercise guidelines will be given to patient during program as part of exercise prescription that the participant will acknowledge.   Education: Aerobic Exercise: - Group verbal and visual presentation on the components of exercise prescription. Introduces F.I.T.T principle from ACSM for exercise prescriptions.  Reviews F.I.T.T. principles of aerobic exercise including progression. Written material given at graduation. Flowsheet Row Cardiac Rehab from 08/29/2022 in [facility] Cardiac and Pulmonary Rehab  Education need identified 08/29/22       Education: Resistance Exercise: - Group verbal and visual presentation on the components of exercise prescription.  Introduces F.I.T.T principle from ACSM for exercise prescriptions  Reviews F.I.T.T. principles of resistance exercise including progression. Written material given at graduation.    Education: Exercise & Equipment Safety: - Individual verbal instruction and demonstration of equipment use and safety with use of the equipment. Flowsheet Row Cardiac Rehab from 08/29/2022 in [facility] Cardiac and Pulmonary Rehab  Date 08/29/22  Educator [facility]  Instruction Review Code 1- Verbalizes Understanding       Education: Exercise Physiology & General Exercise Guidelines: - Group verbal and written instruction with models to review the exercise physiology of the cardiovascular system and associated critical values. Provides general exercise guidelines with specific guidelines to those with heart or lung disease.  Flowsheet Row Cardiac Rehab from 08/29/2022 in [facility] Cardiac and Pulmonary Rehab  Education need identified 08/29/22       Education: Flexibility, Balance, Mind/Body Relaxation: - Group verbal and visual presentation with interactive activity on the components of exercise prescription. Introduces F.I.T.T principle from ACSM for exercise prescriptions. Reviews F.I.T.T. principles of flexibility and balance exercise training including progression. Also discusses the mind body connection.  Reviews various relaxation techniques to help reduce and manage stress (i.e. Deep breathing, progressive muscle relaxation, and visualization). Balance handout provided to take home. Written material given at graduation.   Activity Barriers & Risk Stratification:  Activity Barriers & Cardiac Risk Stratification - 08/29/22 1724       Activity Barriers & Cardiac Risk Stratification   Activity Barriers None    Cardiac Risk Stratification High             6 Minute Walk:  6 Minute Walk     Row Name 08/29/22 1723         6 Minute Walk   Phase Initial     Distance 1000 feet     Walk Time 6 minutes     # of  Rest Breaks 0     MPH 1.89     METS 2.33     RPE 12     Perceived Dyspnea  1     VO2 Peak 8.16     Symptoms Yes (comment)     Comments legs burning feeling during walk test     Resting HR 68 bpm     Resting BP 122/70     Resting Oxygen Saturation  99 %     Exercise Oxygen Saturation  during 6 min walk 97 %     Max Ex. HR 77 bpm     Max Ex. BP 128/70     2 Minute Post BP 110/60              Oxygen Initial Assessment:   Oxygen Re-Evaluation:   Oxygen Discharge (Final Oxygen Re-Evaluation):   Initial Exercise Prescription:  Initial Exercise Prescription - 08/29/22 1700       Date of Initial Exercise RX and Referring  Provider   Date 08/29/22    Referring Provider Arida      Oxygen   Maintain Oxygen Saturation 88% or higher      Treadmill   MPH 1.5    Grade 0.5    Minutes 15    METs 2.33      NuStep   Level 2    SPM 80    Minutes 15    METs 2.33      Recumbant Elliptical   Level 1    RPM 50    Minutes 15    METs 2.33      Biostep-RELP   Level 1    SPM 50    Minutes 15    METs 2.33      Track   Laps 25    Minutes 15    METs 2.36      Prescription Details   Frequency (times per week) 3    Duration Progress to 30 minutes of continuous aerobic without signs/symptoms of physical distress      Intensity   THRR 40-80% of Max Heartrate 98-129    Ratings of Perceived Exertion 11-13    Perceived Dyspnea 0-4      Progression   Progression Continue to progress workloads to maintain intensity without signs/symptoms of physical distress.      Resistance Training   Training Prescription Yes    Weight 2    Reps 10-15             Perform Capillary Blood Glucose checks as needed.  Exercise Prescription Changes:   Exercise Prescription Changes     Row Name 08/29/22 1700             Response to Exercise   Blood Pressure (Admit) 122/70       Blood Pressure (Exercise) 128/70       Blood Pressure (Exit) 110/60       Heart Rate (Admit)  68 bpm       Heart Rate (Exercise) 77 bpm       Heart Rate (Exit) 71 bpm       Oxygen Saturation (Admit) 99 %       Oxygen Saturation (Exercise) 97 %       Oxygen Saturation (Exit) 95 %       Rating of Perceived Exertion (Exercise) 12       Perceived Dyspnea (Exercise) 1       Symptoms legs burned a little       Comments 6 MWT results                Exercise Comments:   Exercise Goals and Review:   Exercise Goals     Row Name 08/29/22 1727             Exercise Goals   Increase Physical Activity Yes       Intervention Provide advice, education, support and counseling about physical activity/exercise needs.;Develop an individualized exercise prescription for aerobic and resistive training based on initial evaluation findings, risk stratification, comorbidities and participant's personal goals.       Expected Outcomes Short Term: Attend rehab on a regular basis to increase amount of physical activity.;Long Term: Add in home exercise to make exercise part of routine and to increase amount of physical activity.;Long Term: Exercising regularly at least 3-5 days a week.       Increase Strength and Stamina Yes       Intervention Provide advice, education, support and counseling  about physical activity/exercise needs.;Develop an individualized exercise prescription for aerobic and resistive training based on initial evaluation findings, risk stratification, comorbidities and participant's personal goals.       Expected Outcomes Short Term: Increase workloads from initial exercise prescription for resistance, speed, and METs.;Short Term: Perform resistance training exercises routinely during rehab and add in resistance training at home;Long Term: Improve cardiorespiratory fitness, muscular endurance and strength as measured by increased METs and functional capacity ( )       Able to understand and use rate of perceived exertion (RPE) scale Yes       Intervention Provide education and  explanation on how to use RPE scale       Expected Outcomes Short Term: Able to use RPE daily in rehab to express subjective intensity level;Long Term:  Able to use RPE to guide intensity level when exercising independently       Able to understand and use Dyspnea scale Yes       Intervention Provide education and explanation on how to use Dyspnea scale       Expected Outcomes Short Term: Able to use Dyspnea scale daily in rehab to express subjective sense of shortness of breath during exertion;Long Term: Able to use Dyspnea scale to guide intensity level when exercising independently       Knowledge and understanding of Target Heart Rate Range (THRR) Yes       Intervention Provide education and explanation of THRR including how the numbers were predicted and where they are located for reference       Expected Outcomes Short Term: Able to state/look up THRR;Long Term: Able to use THRR to govern intensity when exercising independently;Short Term: Able to use daily as guideline for intensity in rehab       Able to check pulse independently Yes       Intervention Provide education and demonstration on how to check pulse in carotid and radial arteries.;Review the importance of being able to check your own pulse for safety during independent exercise       Expected Outcomes Short Term: Able to explain why pulse checking is important during independent exercise;Long Term: Able to check pulse independently and accurately       Understanding of Exercise Prescription Yes       Intervention Provide education, explanation, and written materials on patient's individual exercise prescription       Expected Outcomes Short Term: Able to explain program exercise prescription;Long Term: Able to explain home exercise prescription to exercise independently                Exercise Goals Re-Evaluation :   Discharge Exercise Prescription (Final Exercise Prescription Changes):  Exercise Prescription Changes -  08/29/22 1700       Response to Exercise   Blood Pressure (Admit) 122/70    Blood Pressure (Exercise) 128/70    Blood Pressure (Exit) 110/60    Heart Rate (Admit) 68 bpm    Heart Rate (Exercise) 77 bpm    Heart Rate (Exit) 71 bpm    Oxygen Saturation (Admit) 99 %    Oxygen Saturation (Exercise) 97 %    Oxygen Saturation (Exit) 95 %    Rating of Perceived Exertion (Exercise) 12    Perceived Dyspnea (Exercise) 1    Symptoms legs burned a little    Comments 6 MWT results             Nutrition:  Target Goals: Understanding of nutrition guidelines, daily intake of sodium <  1500mg , cholesterol 200mg , calories 30% from fat and 7% or less from saturated fats, daily to have 5 or more servings of fruits and vegetables.  Education: All About Nutrition: -Group instruction provided by verbal, written material, interactive activities, discussions, models, and posters to present general guidelines for heart healthy nutrition including fat, fiber, MyPlate, the role of sodium in heart healthy nutrition, utilization of the nutrition label, and utilization of this knowledge for meal planning. Follow up email sent as well. Written material given at graduation. Flowsheet Row Cardiac Rehab from 08/29/2022 in [facility] Cardiac and Pulmonary Rehab  Education need identified 08/29/22       Biometrics:  Pre Biometrics - 08/29/22 1727       Pre Biometrics   Height 5\' 5"  (1.651 m)    Weight 109 lb 8 oz (49.7 kg)    Waist Circumference 26.5 inches    Hip Circumference 35 inches    Waist to Hip Ratio 0.76 %    BMI (Calculated) 18.22    Single Leg Stand 30 seconds              Nutrition Therapy Plan and Nutrition Goals:  Nutrition Therapy & Goals - 08/29/22 1728       Intervention Plan   Intervention Prescribe, educate and counsel regarding individualized specific dietary modifications aiming towards targeted core components such as weight, hypertension, lipid management, diabetes, heart  failure and other comorbidities.    Expected Outcomes Short Term Goal: Understand basic principles of dietary content, such as calories, fat, sodium, cholesterol and nutrients.;Short Term Goal: A plan has been developed with personal nutrition goals set during dietitian appointment.;Long Term Goal: Adherence to prescribed nutrition plan.             Nutrition Assessments:  MEDIFICTS Score Key: ?70 Need to make dietary changes  40-70 Heart Healthy Diet ? 40 Therapeutic Level Cholesterol Diet  Flowsheet Row Cardiac Rehab from 08/29/2022 in [facility] Cardiac and Pulmonary Rehab  Picture Your Plate Total Score on Admission 50      Picture Your Plate Scores: <16 Unhealthy dietary pattern with much room for improvement. 41-50 Dietary pattern unlikely to meet recommendations for good health and room for improvement. 51-60 More healthful dietary pattern, with some room for improvement.  >60 Healthy dietary pattern, although there may be some specific behaviors that could be improved.    Nutrition Goals Re-Evaluation:   Nutrition Goals Discharge (Final Nutrition Goals Re-Evaluation):   Psychosocial: Target Goals: Acknowledge presence or absence of significant depression and/or stress, maximize coping skills, provide positive support system. Participant is able to verbalize types and ability to use techniques and skills needed for reducing stress and depression.   Education: Stress, Anxiety, and Depression - Group verbal and visual presentation to define topics covered.  Reviews how body is impacted by stress, anxiety, and depression.  Also discusses healthy ways to reduce stress and to treat/manage anxiety and depression.  Written material given at graduation.   Education: Sleep Hygiene -Provides group verbal and written instruction about how sleep can affect your health.  Define sleep hygiene, discuss sleep cycles and impact of sleep habits. Review good sleep hygiene tips.    Initial  Review & Psychosocial Screening:  Initial Psych Review & Screening - 08/24/22 1039       Initial Review   Current issues with None Identified      Family Dynamics   Good Support System? Yes   Daughter, good friend     Barriers   Psychosocial  barriers to participate in program There are no identifiable barriers or psychosocial needs.      Screening Interventions   Interventions Encouraged to exercise;To provide support and resources with identified psychosocial needs;Provide feedback about the scores to participant    Expected Outcomes Short Term goal: Utilizing psychosocial counselor, staff and physician to assist with identification of specific Stressors or current issues interfering with healing process. Setting desired goal for each stressor or current issue identified.;Long Term Goal: Stressors or current issues are controlled or eliminated.;Short Term goal: Identification and review with participant of any Quality of Life or Depression concerns found by scoring the questionnaire.;Long Term goal: The participant improves quality of Life and PHQ9 Scores as seen by post scores and/or verbalization of changes             Quality of Life Scores:   Quality of Life - 08/29/22 1729       Quality of Life   Select Quality of Life      Quality of Life Scores   Health/Function Pre 22.23 %    Socioeconomic Pre 28.21 %    Psych/Spiritual Pre 28.29 %    Family Pre 30 %    GLOBAL Pre 25.85 %            Scores of 19 and below usually indicate a poorer quality of life in these areas.  A difference of  2-3 points is a clinically meaningful difference.  A difference of 2-3 points in the total score of the Quality of Life Index has been associated with significant improvement in overall quality of life, self-image, physical symptoms, and general health in studies assessing change in quality of life.  PHQ-9: Review Flowsheet       08/29/2022  Depression screen PHQ 2/9  Decreased  Interest 0  Down, Depressed, Hopeless 0  PHQ - 2 Score 0  Altered sleeping 2  Tired, decreased energy 3  Change in appetite 0  Feeling bad or failure about yourself  0  Trouble concentrating 0  Moving slowly or fidgety/restless 0  Suicidal thoughts 0  PHQ-9 Score 5  Difficult doing work/chores Not difficult at all   Interpretation of Total Score  Total Score Depression Severity:  1-4 = Minimal depression, 5-9 = Mild depression, 10-14 = Moderate depression, 15-19 = Moderately severe depression, 20-27 = Severe depression   Psychosocial Evaluation and Intervention:  Psychosocial Evaluation - 08/24/22 1054       Psychosocial Evaluation & Interventions   Interventions Encouraged to exercise with the program and follow exercise prescription    Comments Karsynn has no barriers to starting the program. She lives alone and has neigbors in the other condos in her complex. Her daughter and a good friend are her support. SHe is ready to stop being a "couch potato". Sh eis concerned about her weight loss and wants to gain back to her weight of 112 LB. She wants to meet with the RD as soon as possible. She is ready to get started.    Expected Outcomes STG Henley attends all scheduled sessions, she is able to meet with our new RD when he starts  working in the program in early June.  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 She has successfully dropped from 20 cigarettes a day to 2 a day. Her goal is total cessationLTG Khadija has resources and direrction to work on her weight gain and she has quit tobacco .  Continue Psychosocial Services  Follow up required by staff             Psychosocial Re-Evaluation:   Psychosocial Discharge (Final Psychosocial Re-Evaluation):   Vocational Rehabilitation: Provide vocational rehab assistance to qualifying candidates.   Vocational Rehab Evaluation & Intervention:   Education: Education Goals: Education classes  will be provided on a variety of topics geared toward better understanding of heart health and risk factor modification. Participant will state understanding/return demonstration of topics presented as noted by education test scores.  Learning Barriers/Preferences:  Learning Barriers/Preferences - 08/24/22 1042       Learning Barriers/Preferences   Learning Barriers None    Learning Preferences None             General Cardiac Education Topics:  AED/CPR: - Group verbal and written instruction with the use of models to demonstrate the basic use of the AED with the basic ABC's of resuscitation.   Anatomy and Cardiac Procedures: - Group verbal and visual presentation and models provide information about basic cardiac anatomy and function. Reviews the testing methods done to diagnose heart disease and the outcomes of the test results. Describes the treatment choices: Medical Management, Angioplasty, or Coronary Bypass Surgery for treating various heart conditions including Myocardial Infarction, Angina, Valve Disease, and Cardiac Arrhythmias.  Written material given at graduation.   Medication Safety: - Group verbal and visual instruction to review commonly prescribed medications for heart and lung disease. Reviews the medication, class of the drug, and side effects. Includes the steps to properly store meds and maintain the prescription regimen.  Written material given at graduation.   Intimacy: - Group verbal instruction through game format to discuss how heart and lung disease can affect sexual intimacy. Written material given at graduation..   Know Your Numbers and Heart Failure: - Group verbal and visual instruction to discuss disease risk factors for cardiac and pulmonary disease and treatment options.  Reviews associated critical values for Overweight/Obesity, Hypertension, Cholesterol, and Diabetes.  Discusses basics of heart failure: signs/symptoms and treatments.  Introduces  Heart Failure Zone chart for action plan for heart failure.  Written material given at graduation.   Infection Prevention: - Provides verbal and written material to individual with discussion of infection control including proper hand washing and proper equipment cleaning during exercise session. Flowsheet Row Cardiac Rehab from 08/29/2022 in [facility] Cardiac and Pulmonary Rehab  Date 08/29/22  Educator [facility]  Instruction Review Code 1- Verbalizes Understanding       Falls Prevention: - Provides verbal and written material to individual with discussion of falls prevention and safety. Flowsheet Row Cardiac Rehab from 08/29/2022 in [facility] Cardiac and Pulmonary Rehab  Date 08/29/22  Educator [facility]  Instruction Review Code 1- Verbalizes Understanding       Other: -Provides group and verbal instruction on various topics (see comments)   Knowledge Questionnaire Score:  Knowledge Questionnaire Score - 08/29/22 1730       Knowledge Questionnaire Score   Pre Score 23/26             Core Components/Risk Factors/Patient Goals at Admission:  Personal Goals and Risk Factors at Admission - 08/29/22 1728       Core Components/Risk Factors/Patient Goals on Admission    Weight Management Yes;Weight Gain    Intervention Weight Management: Develop a combined nutrition and exercise program designed to reach desired caloric intake, while maintaining appropriate intake of nutrient and fiber, sodium and fats, and appropriate energy expenditure required for the weight goal.;Weight  Management: Provide education and appropriate resources to help participant work on and attain dietary goals.    Admit Weight 109 lb 8 oz (49.7 kg)    Goal Weight: Short Term 115 lb (52.2 kg)    Goal Weight: Long Term 115 lb (52.2 kg)    Expected Outcomes Short Term: Continue to assess and modify interventions until short term weight is achieved;Long Term: Adherence to nutrition and physical activity/exercise program aimed  toward attainment of established weight goal;Weight Gain: Understanding of general recommendations for a high calorie, high protein meal plan that promotes weight gain by distributing calorie intake throughout the day with the consumption for 4-5 meals, snacks, and/or supplements;Understanding recommendations for meals to include 15-35% energy as protein, 25-35% energy from fat, 35-60% energy from carbohydrates, less than 200mg  of dietary cholesterol, 20-35 gm of total fiber daily;Understanding of distribution of calorie intake throughout the day with the consumption of 4-5 meals/snacks    Tobacco Cessation Yes    Number of packs per day 4 cigarettes a day  working towards cessation.    Ameerah is a current tobacco user. Intervention for tobacco cessation was provided at the initial medical review. She was asked about readiness to quit and reported that she is down to 2 cigarettes a day from a pack. She is working towards cessation. No quit date gas been sedt.  . Patient was advised and educated about tobacco cessation using combination therapy, tobacco cessation classes, quit line, and quit smoking apps. Patient demonstrated understanding of this material. Staff will continue to provide encouragement and follow up with the patient throughout the program.    Intervention Assist the participant in steps to quit. Provide individualized education and counseling about committing to Tobacco Cessation, relapse prevention, and pharmacological support that can be provided by physician.;Education officer, environmental, assist with locating and accessing local/national Quit Smoking programs, and support quit date choice.    Expected Outcomes Short Term: Will demonstrate readiness to quit, by selecting a quit date.;Short Term: Will quit all tobacco product use, adhering to prevention of relapse plan.;Long Term: Complete abstinence from all tobacco products for at least 12 months from quit date.    Hypertension Yes     Intervention Provide education on lifestyle modifcations including regular physical activity/exercise, weight management, moderate sodium restriction and increased consumption of fresh fruit, vegetables, and low fat dairy, alcohol moderation, and smoking cessation.;Monitor prescription use compliance.    Expected Outcomes Short Term: Continued assessment and intervention until BP is < 140/53mm HG in hypertensive participants. < 130/48mm HG in hypertensive participants with diabetes, heart failure or chronic kidney disease.;Long Term: Maintenance of blood pressure at goal levels.    Lipids Yes    Intervention Provide education and support for participant on nutrition & aerobic/resistive exercise along with prescribed medications to achieve LDL 70mg , HDL >40mg .    Expected Outcomes Short Term: Participant states understanding of desired cholesterol values and is compliant with medications prescribed. Participant is following exercise prescription and nutrition guidelines.;Long Term: Cholesterol controlled with medications as prescribed, with individualized exercise RX and with personalized nutrition plan. Value goals: LDL < 70mg , HDL > 40 mg.             Education:Diabetes - Individual verbal and written instruction to review signs/symptoms of diabetes, desired ranges of glucose level fasting, after meals and with exercise. Acknowledge that pre and post exercise glucose checks will be done for 3 sessions at entry of program.   Core Components/Risk Factors/Patient Goals Review:  Core Components/Risk Factors/Patient Goals at Discharge (Final Review):    ITP Comments:  ITP Comments     Row Name 08/24/22 1058 08/29/22 1721         ITP Comments Virtual orientation call completed today. shehas an appointment on Date: 08/29/2022  for EP eval and gym Orientation.  Documentation of diagnosis [facility] be found in [facility]  Date: 07/08/2022 .   Erini is a current tobacco user. Intervention for tobacco  cessation was provided at the initial medical review. She was asked about readiness to quit and reported that she is down to 2 cigarettes a day from a pack. She is working towards cessation. No quit date gas been sedt.  . Patient was advised and educated about tobacco cessation using combination therapy, tobacco cessation classes, quit line, and quit smoking apps. Patient demonstrated understanding of this material. Staff will continue to provide encouragement and follow up with the patient throughout the program. Completed and gym orientation. Initial ITP created and sent for review to Dr. Bethann Punches, Medical Director.Zamaya is a current tobacco user. Intervention for tobacco cessation was provided at the initial medical review. She was asked about readiness to quit and reported she is down to 4 cigarettes a day and desires to quit but has not chosen a quit date yet . Patient was advised and educated about tobacco cessation using combination therapy, tobacco cessation classes, quit line, and quit smoking apps. Patient demonstrated understanding of this material. Staff will continue to provide encouragement and follow up with the patient throughout the program.               Comments: initial ITP

## 2022-09-06 ENCOUNTER — Other Ambulatory Visit: Payer: Self-pay | Admitting: Physician Assistant

## 2022-09-07 ENCOUNTER — Encounter: Payer: Medicare Other | Admitting: *Deleted

## 2022-09-07 DIAGNOSIS — I252 Old myocardial infarction: Secondary | ICD-10-CM | POA: Diagnosis not present

## 2022-09-07 DIAGNOSIS — I214 Non-ST elevation (NSTEMI) myocardial infarction: Secondary | ICD-10-CM

## 2022-09-07 NOTE — Progress Notes (Signed)
Daily Session Note  Patient Details  Name: Tiffany Munoz MRN: [id number] Date of Birth: June 18, 1945 Referring Provider:   Flowsheet Row Cardiac Rehab from 08/29/2022 in [facility] Cardiac and Pulmonary Rehab  Referring Provider Kirke Corin       Encounter Date: 09/07/2022  Check In:  Session Check In - 09/07/22 1607       Check-In   Supervising physician immediately available to respond to emergencies See telemetry face sheet for immediately available ER MD    Location [facility]-Cardiac & Pulmonary Rehab    Staff Present Susann Givens, RN BSN;Joseph Reino Kent, RCP,RRT,BSRT;Megan Chester, RN, [place]    Virtual Visit No    Medication changes reported     No    Fall or balance concerns reported    No    Tobacco Cessation Use Increase    Current number of cigarettes/nicotine per day     2    Warm-up and Cool-down Performed on first and last piece of equipment    Resistance Training Performed Yes    VAD Patient? No    PAD/SET Patient? No      Pain Assessment   Currently in Pain? No/denies                Social History   Tobacco Use  Smoking Status Every Day   Packs/day: 0.50   Years: 55.00   Additional pack years: 0.00   Total pack years: 27.50   Types: Cigarettes  Smokeless Tobacco Never  Tobacco Comments   2 cigarettes a day working toward cessation    Goals Met:  Independence with exercise equipment Exercise tolerated well No report of concerns or symptoms today Strength training completed today  Goals Unmet:  Not Applicable  Comments: First full day of exercise!  Patient was oriented to gym and equipment including functions, settings, policies, and procedures.  Patient's individual exercise prescription and treatment plan were reviewed.  All starting workloads were established based on the results of the 6 minute walk test done at initial orientation visit.  The plan for exercise progression was also introduced and progression will be customized based on patient's performance and  goals.     Dr. Bethann Punches is Medical Director for [facility] Cardiac Rehabilitation.  Dr. Vida Rigger is Medical Director for [facility] Pulmonary Rehabilitation.

## 2022-09-08 ENCOUNTER — Encounter: Payer: Medicare Other | Admitting: *Deleted

## 2022-09-08 DIAGNOSIS — I252 Old myocardial infarction: Secondary | ICD-10-CM | POA: Diagnosis not present

## 2022-09-08 DIAGNOSIS — Z955 Presence of coronary angioplasty implant and graft: Secondary | ICD-10-CM

## 2022-09-08 DIAGNOSIS — I214 Non-ST elevation (NSTEMI) myocardial infarction: Secondary | ICD-10-CM

## 2022-09-08 NOTE — Progress Notes (Signed)
Daily Session Note  Patient Details  Name: Tiffany Munoz MRN: [id number] Date of Birth: 1945/10/30 Referring Provider:   Flowsheet Row Cardiac Rehab from 08/29/2022 in [facility] Cardiac and Pulmonary Rehab  Referring Provider Kirke Corin       Encounter Date: 09/08/2022  Check In:  Session Check In - 09/08/22 1603       Check-In   Supervising physician immediately available to respond to emergencies See telemetry face sheet for immediately available ER MD    Location [facility]-Cardiac & Pulmonary Rehab    Staff Present Susann Givens, RN BSN;Joseph Paoli, RCP,RRT,BSRT;Jessica Sayre, [place], RCEP, CCRP, CCET    Virtual Visit No    Medication changes reported     No    Fall or balance concerns reported    No    Tobacco Cessation Use Increase    Current number of cigarettes/nicotine per day     4    Warm-up and Cool-down Performed on first and last piece of equipment    Resistance Training Performed Yes    VAD Patient? No    PAD/SET Patient? No      Pain Assessment   Currently in Pain? No/denies                Social History   Tobacco Use  Smoking Status Every Day   Packs/day: 0.50   Years: 55.00   Additional pack years: 0.00   Total pack years: 27.50   Types: Cigarettes  Smokeless Tobacco Never  Tobacco Comments   2 cigarettes a day working toward cessation    Goals Met:  Independence with exercise equipment Exercise tolerated well No report of concerns or symptoms today Strength training completed today  Goals Unmet:  Not Applicable  Comments: Pt able to follow exercise prescription today without complaint.  Will continue to monitor for progression.    Dr. Bethann Punches is Medical Director for [facility] Cardiac Rehabilitation.  Dr. Vida Rigger is Medical Director for [facility] Pulmonary Rehabilitation.

## 2022-09-12 ENCOUNTER — Encounter: Payer: Medicare Other | Attending: Cardiovascular Disease | Admitting: *Deleted

## 2022-09-12 DIAGNOSIS — I214 Non-ST elevation (NSTEMI) myocardial infarction: Secondary | ICD-10-CM | POA: Insufficient documentation

## 2022-09-12 DIAGNOSIS — Z955 Presence of coronary angioplasty implant and graft: Secondary | ICD-10-CM | POA: Insufficient documentation

## 2022-09-12 NOTE — Progress Notes (Signed)
Daily Session Note  Patient Details  Name: Tiffany Munoz MRN: [id number] Date of Birth: 07-Jun-1945 Referring Provider:   Flowsheet Row Cardiac Rehab from 08/29/2022 in [facility] Cardiac and Pulmonary Rehab  Referring Provider Kirke Corin       Encounter Date: 09/12/2022  Check In:  Session Check In - 09/12/22 1657       Check-In   Supervising physician immediately available to respond to emergencies See telemetry face sheet for immediately available ER MD    Location [facility]-Cardiac & Pulmonary Rehab    Staff Present Cyndia Diver, RN, BSN, Cammie Sickle, RCP,RRT,BSRT;Noah Tickle, BS, Exercise Physiologist    Virtual Visit No    Medication changes reported     No    Fall or balance concerns reported    No    Tobacco Cessation No Change    Warm-up and Cool-down Performed on first and last piece of equipment    Resistance Training Performed Yes    VAD Patient? No    PAD/SET Patient? No      Pain Assessment   Currently in Pain? No/denies                Social History   Tobacco Use  Smoking Status Every Day   Packs/day: 0.50   Years: 55.00   Additional pack years: 0.00   Total pack years: 27.50   Types: Cigarettes  Smokeless Tobacco Never  Tobacco Comments   2 cigarettes a day working toward cessation    Goals Met:  Independence with exercise equipment Exercise tolerated well No report of concerns or symptoms today  Goals Unmet:  Not Applicable  Comments: Pt able to follow exercise prescription today without complaint.  Will continue to monitor for progression.    Dr. Bethann Punches is Medical Director for [facility] Cardiac Rehabilitation.  Dr. Vida Rigger is Medical Director for [facility] Pulmonary Rehabilitation.

## 2022-09-14 ENCOUNTER — Encounter: Payer: Medicare Other | Admitting: *Deleted

## 2022-09-14 DIAGNOSIS — Z955 Presence of coronary angioplasty implant and graft: Secondary | ICD-10-CM

## 2022-09-14 DIAGNOSIS — I214 Non-ST elevation (NSTEMI) myocardial infarction: Secondary | ICD-10-CM

## 2022-09-14 NOTE — Progress Notes (Signed)
Daily Session Note  Patient Details  Name: Tiffany Munoz MRN: [id number] Date of Birth: 12-Nov-1945 Referring Provider:   Flowsheet Row Cardiac Rehab from 08/29/2022 in [facility] Cardiac and Pulmonary Rehab  Referring Provider Kirke Corin       Encounter Date: 09/14/2022  Check In:  Session Check In - 09/14/22 1627       Check-In   Supervising physician immediately available to respond to emergencies See telemetry face sheet for immediately available ER MD    Location [facility]-Cardiac & Pulmonary Rehab    Staff Present Tommye Standard, BS, ACSM CEP, Exercise Physiologist;Iasia Forcier Karleen [place] RN, BSN;Susanne Bice, RN, BSN, CCRP;Joseph [place], RCP,RRT,BSRT    Virtual Visit No    Medication changes reported     No    Fall or balance concerns reported    No    Warm-up and Cool-down Performed on first and last piece of equipment    Resistance Training Performed Yes    VAD Patient? No    PAD/SET Patient? No      Pain Assessment   Currently in Pain? No/denies                Social History   Tobacco Use  Smoking Status Every Day   Packs/day: 0.50   Years: 55.00   Additional pack years: 0.00   Total pack years: 27.50   Types: Cigarettes  Smokeless Tobacco Never  Tobacco Comments   2 cigarettes a day working toward cessation    Goals Met:  Independence with exercise equipment Exercise tolerated well No report of concerns or symptoms today Strength training completed today  Goals Unmet:  Not Applicable  Comments: Pt able to follow exercise prescription today without complaint.  Will continue to monitor for progression.    Dr. Bethann Punches is Medical Director for [facility] Cardiac Rehabilitation.  Dr. Vida Rigger is Medical Director for [facility] Pulmonary Rehabilitation.

## 2022-09-15 ENCOUNTER — Encounter: Payer: Medicare Other | Admitting: *Deleted

## 2022-09-15 DIAGNOSIS — Z955 Presence of coronary angioplasty implant and graft: Secondary | ICD-10-CM

## 2022-09-15 DIAGNOSIS — I214 Non-ST elevation (NSTEMI) myocardial infarction: Secondary | ICD-10-CM | POA: Diagnosis not present

## 2022-09-15 NOTE — Progress Notes (Signed)
Daily Session Note  Patient Details  Name: Tiffany Munoz MRN: [id number] Date of Birth: [birth year] Referring Provider:   Flowsheet Row Cardiac Rehab from 08/29/2022 in [facility] Cardiac and Pulmonary Rehab  Referring Provider Kirke Corin       Encounter Date: 09/15/2022  Check In:  Session Check In - 09/15/22 1640       Check-In   Supervising physician immediately available to respond to emergencies See telemetry face sheet for immediately available ER MD    Location [facility]-Cardiac & Pulmonary Rehab    Staff Present Cyndia Diver, RN, BSN, Cammie Sickle, RCP,RRT,BSRT;Noah Tickle, [place], Exercise Physiologist    Virtual Visit No    Medication changes reported     No    Fall or balance concerns reported    No    Tobacco Cessation No Change    Warm-up and Cool-down Performed on first and last piece of equipment    Resistance Training Performed Yes    VAD Patient? No    PAD/SET Patient? No      Pain Assessment   Currently in Pain? No/denies                Social History   Tobacco Use  Smoking Status Every Day   Packs/day: 0.50   Years: 55.00   Additional pack years: 0.00   Total pack years: 27.50   Types: Cigarettes  Smokeless Tobacco Never  Tobacco Comments   2 cigarettes a day working toward cessation    Goals Met:  Independence with exercise equipment Exercise tolerated well No report of concerns or symptoms today  Goals Unmet:  Not Applicable  Comments: Pt able to follow exercise prescription today without complaint.  Will continue to monitor for progression.    Dr. Bethann Punches is Medical Director for [facility] Cardiac Rehabilitation.  Dr. Vida Rigger is Medical Director for [facility] Pulmonary Rehabilitation.

## 2022-09-19 ENCOUNTER — Encounter: Payer: Medicare Other | Admitting: *Deleted

## 2022-09-19 DIAGNOSIS — I214 Non-ST elevation (NSTEMI) myocardial infarction: Secondary | ICD-10-CM

## 2022-09-19 DIAGNOSIS — Z955 Presence of coronary angioplasty implant and graft: Secondary | ICD-10-CM

## 2022-09-19 NOTE — Progress Notes (Signed)
Daily Session Note  Patient Details  Name: Tiffany Munoz MRN: [id number] Date of Birth: [birth year] Referring Provider:   Flowsheet Row Cardiac Rehab from 08/29/2022 in [facility] Cardiac and Pulmonary Rehab  Referring Provider Arida       Encounter Date: 09/19/2022  Check In:  Session Check In - 09/19/22 1612       Check-In   Supervising physician immediately available to respond to emergencies See telemetry face sheet for immediately available ER MD    Location [facility]-Cardiac & Pulmonary Rehab    Staff Present Susann Givens, RN BSN;Joseph [place], RCP,RRT,BSRT;Laureen Porter, [place], RRT, CPFT    Virtual Visit No    Medication changes reported     No    Fall or balance concerns reported    No    Tobacco Cessation No Change    Current number of cigarettes/nicotine per day     4    Warm-up and Cool-down Performed on first and last piece of equipment    Resistance Training Performed Yes    VAD Patient? No    PAD/SET Patient? No      Pain Assessment   Currently in Pain? No/denies                Social History   Tobacco Use  Smoking Status Every Day   Packs/day: 0.50   Years: 55.00   Additional pack years: 0.00   Total pack years: 27.50   Types: Cigarettes  Smokeless Tobacco Never  Tobacco Comments   2 cigarettes a day working toward cessation    Goals Met:  Independence with exercise equipment Exercise tolerated well No report of concerns or symptoms today Strength training completed today  Goals Unmet:  Not Applicable  Comments: Pt able to follow exercise prescription today without complaint.  Will continue to monitor for progression.    Dr. Bethann Punches is Medical Director for [facility] Cardiac Rehabilitation.  Dr. Vida Rigger is Medical Director for [facility] Pulmonary Rehabilitation.

## 2022-09-21 ENCOUNTER — Encounter: Payer: Medicare Other | Admitting: *Deleted

## 2022-09-21 ENCOUNTER — Encounter: Payer: Self-pay | Admitting: *Deleted

## 2022-09-21 DIAGNOSIS — Z955 Presence of coronary angioplasty implant and graft: Secondary | ICD-10-CM

## 2022-09-21 DIAGNOSIS — I214 Non-ST elevation (NSTEMI) myocardial infarction: Secondary | ICD-10-CM

## 2022-09-21 NOTE — Progress Notes (Signed)
Daily Session Note  Patient Details  Name: Tiffany Munoz MRN: [id number] Date of Birth: [birth year] Referring Provider:   Flowsheet Row Cardiac Rehab from 08/29/2022 in [facility] Cardiac and Pulmonary Rehab  Referring Provider Kirke Corin       Encounter Date: 09/21/2022  Check In:  Session Check In - 09/21/22 1556       Check-In   Supervising physician immediately available to respond to emergencies See telemetry face sheet for immediately available ER MD    Location [facility]-Cardiac & Pulmonary Rehab    Staff Present Susann Givens, RN BSN;Joseph [place], RCP,RRT,BSRT;Laureen Fowler, [place], RRT, CPFT    Virtual Visit No    Medication changes reported     No    Fall or balance concerns reported    No    Tobacco Cessation Use Increase    Current number of cigarettes/nicotine per day     5    Warm-up and Cool-down Performed on first and last piece of equipment    Resistance Training Performed Yes    VAD Patient? No    PAD/SET Patient? No      Pain Assessment   Currently in Pain? No/denies                Social History   Tobacco Use  Smoking Status Every Day   Packs/day: 0.50   Years: 55.00   Additional pack years: 0.00   Total pack years: 27.50   Types: Cigarettes  Smokeless Tobacco Never  Tobacco Comments   2 cigarettes a day working toward cessation    Goals Met:  Independence with exercise equipment Exercise tolerated well No report of concerns or symptoms today Strength training completed today  Goals Unmet:  Not Applicable  Comments: Pt able to follow exercise prescription today without complaint.  Will continue to monitor for progression.    Dr. Bethann Punches is Medical Director for [facility] Cardiac Rehabilitation.  Dr. Vida Rigger is Medical Director for [facility] Pulmonary Rehabilitation.

## 2022-09-21 NOTE — Progress Notes (Signed)
Cardiac Individual Treatment Plan  Patient Details  Name: Tiffany Munoz MRN: [id number] Date of Birth: November 24, 1945 Referring Provider:   Flowsheet Row Cardiac Rehab from 08/29/2022 in [facility] Cardiac and Pulmonary Rehab  Referring Provider Arida       Initial Encounter Date:  Flowsheet Row Cardiac Rehab from 08/29/2022 in [facility] Cardiac and Pulmonary Rehab  Date 08/29/22       Visit Diagnosis: Status post coronary artery stent placement  NSTEMI (non-ST elevation myocardial infarction) (HCC)  Patient's Home Medications on Admission:  Current Outpatient Medications:    amLODipine (NORVASC) 5 MG tablet, TAKE 1 TABLET(5 MG) BY MOUTH EVERY EVENING, Disp: 30 tablet, Rfl: 2   aspirin 81 MG tablet, Take 1-2 tablets by mouth daily., Disp: , Rfl:    carvedilol (COREG) 3.125 MG tablet, Take 1 tablet (3.125 mg total) by mouth 2 (two) times daily with a meal., Disp: 30 tablet, Rfl: 2   cetirizine (ZYRTEC ALLERGY) 10 MG tablet, Take 1 tablet by mouth daily as needed for rhinitis or allergies., Disp: , Rfl:    clobetasol cream (TEMOVATE) 0.05 %, Apply 1 Application topically 3 (three) times daily as needed., Disp: , Rfl:    Evolocumab (REPATHA SURECLICK) 140 MG/ML SOAJ, Inject 140 mg into the skin every 14 (fourteen) days., Disp: 2 mL, Rfl: 2   nitroGLYCERIN (NITROSTAT) 0.4 MG SL tablet, Place 1 tablet (0.4 mg total) under the tongue every 5 (five) minutes x 3 doses as needed for chest pain., Disp: 30 tablet, Rfl: 2   potassium chloride (K-DUR,KLOR-CON) 10 MEQ tablet, Take 1 tablet by mouth daily., Disp: , Rfl:    rosuvastatin (CRESTOR) 40 MG tablet, Take 1 tablet (40 mg total) by mouth daily. (Patient not taking: Reported on 08/24/2022), Disp: 30 tablet, Rfl: 2   sodium fluoride (PREVIDENT) 1.1 % GEL dental gel, Take 1 application by mouth daily. (Patient not taking: Reported on 08/02/2022), Disp: , Rfl:    traMADol (ULTRAM) 50 MG tablet, Take 50 mg by mouth every 8 (eight) hours as needed for severe pain  or moderate pain., Disp: , Rfl:   Past Medical History: Past Medical History:  Diagnosis Date   AAA (abdominal aortic aneurysm) (HCC)    Hypertension    Migraine     Tobacco Use: Social History   Tobacco Use  Smoking Status Every Day   Packs/day: 0.50   Years: 55.00   Additional pack years: 0.00   Total pack years: 27.50   Types: Cigarettes  Smokeless Tobacco Never  Tobacco Comments   2 cigarettes a day working toward cessation    Labs: Review Flowsheet       Latest Ref Rng & Units 03/29/2018 07/09/2022  Labs for ITP Cardiac and Pulmonary Rehab  Cholestrol 0 - 200 mg/dL - 119   LDL (calc) 0 - 99 mg/dL - 147   HDL-C >82 mg/dL - 67   Trlycerides <956 mg/dL - 83   Hemoglobin O1H 4.8 - 5.6 % - 5.1   TCO2 22 - 32 mmol/L 31  -     Exercise Target Goals: Exercise Program Goal: Individual exercise prescription set using results from initial 6 min walk test and THRR while considering  patient's activity barriers and safety.   Exercise Prescription Goal: Initial exercise prescription builds to 30-45 minutes a day of aerobic activity, 2-3 days per week.  Home exercise guidelines will be given to patient during program as part of exercise prescription that the participant will acknowledge.   Education: Aerobic Exercise: -  Group verbal and visual presentation on the components of exercise prescription. Introduces F.I.T.T principle from ACSM for exercise prescriptions.  Reviews F.I.T.T. principles of aerobic exercise including progression. Written material given at graduation. Flowsheet Row Cardiac Rehab from 09/14/2022 in [facility] Cardiac and Pulmonary Rehab  Education need identified 08/29/22  Date 09/14/22  Educator [facility]  Instruction Review Code 1- [employer] Understanding       Education: Resistance Exercise: - Group verbal and visual presentation on the components of exercise prescription. Introduces F.I.T.T principle from ACSM for exercise prescriptions  Reviews F.I.T.T.  principles of resistance exercise including progression. Written material given at graduation.    Education: Exercise & Equipment Safety: - Individual verbal instruction and demonstration of equipment use and safety with use of the equipment. Flowsheet Row Cardiac Rehab from 09/14/2022 in [facility] Cardiac and Pulmonary Rehab  Date 08/29/22  Educator [facility]  Instruction Review Code 1- Verbalizes Understanding       Education: Exercise Physiology & General Exercise Guidelines: - Group verbal and written instruction with models to review the exercise physiology of the cardiovascular system and associated critical values. Provides general exercise guidelines with specific guidelines to those with heart or lung disease.  Flowsheet Row Cardiac Rehab from 09/14/2022 in [facility] Cardiac and Pulmonary Rehab  Education need identified 08/29/22       Education: Flexibility, Balance, Mind/Body Relaxation: - Group verbal and visual presentation with interactive activity on the components of exercise prescription. Introduces F.I.T.T principle from ACSM for exercise prescriptions. Reviews F.I.T.T. principles of flexibility and balance exercise training including progression. Also discusses the mind body connection.  Reviews various relaxation techniques to help reduce and manage stress (i.e. Deep breathing, progressive muscle relaxation, and visualization). Balance handout provided to take home. Written material given at graduation.   Activity Barriers & Risk Stratification:  Activity Barriers & Cardiac Risk Stratification - 08/29/22 1724       Activity Barriers & Cardiac Risk Stratification   Activity Barriers None    Cardiac Risk Stratification High             6 Minute Walk:  6 Minute Walk     Row Name 08/29/22 1723         6 Minute Walk   Phase Initial     Distance 1000 feet     Walk Time 6 minutes     # of Rest Breaks 0     MPH 1.89     METS 2.33     RPE 12     Perceived Dyspnea  1      VO2 Peak 8.16     Symptoms Yes (comment)     Comments legs burning feeling during walk test     Resting HR 68 bpm     Resting BP 122/70     Resting Oxygen Saturation  99 %     Exercise Oxygen Saturation  during 6 min walk 97 %     Max Ex. HR 77 bpm     Max Ex. BP 128/70     2 Minute Post BP 110/60              Oxygen Initial Assessment:   Oxygen Re-Evaluation:   Oxygen Discharge (Final Oxygen Re-Evaluation):   Initial Exercise Prescription:  Initial Exercise Prescription - 08/29/22 1700       Date of Initial Exercise RX and Referring Provider   Date 08/29/22    Referring Provider Arida      Oxygen   Maintain Oxygen  Saturation 88% or higher      Treadmill   MPH 1.5    Grade 0.5    Minutes 15    METs 2.33      NuStep   Level 2    SPM 80    Minutes 15    METs 2.33      Recumbant Elliptical   Level 1    RPM 50    Minutes 15    METs 2.33      Biostep-RELP   Level 1    SPM 50    Minutes 15    METs 2.33      Track   Laps 25    Minutes 15    METs 2.36      Prescription Details   Frequency (times per week) 3    Duration Progress to 30 minutes of continuous aerobic without signs/symptoms of physical distress      Intensity   THRR 40-80% of Max Heartrate 98-129    Ratings of Perceived Exertion 11-13    Perceived Dyspnea 0-4      Progression   Progression Continue to progress workloads to maintain intensity without signs/symptoms of physical distress.      Resistance Training   Training Prescription Yes    Weight 2    Reps 10-15             Perform Capillary Blood Glucose checks as needed.  Exercise Prescription Changes:   Exercise Prescription Changes     Row Name 08/29/22 1700 09/13/22 0800           Response to Exercise   Blood Pressure (Admit) 122/70 120/76      Blood Pressure (Exercise) 128/70 134/70      Blood Pressure (Exit) 110/60 128/72      Heart Rate (Admit) 68 bpm 68 bpm      Heart Rate (Exercise) 77 bpm 85 bpm       Heart Rate (Exit) 71 bpm 69 bpm      Oxygen Saturation (Admit) 99 % --      Oxygen Saturation (Exercise) 97 % --      Oxygen Saturation (Exit) 95 % --      Rating of Perceived Exertion (Exercise) 12 13      Perceived Dyspnea (Exercise) 1 --      Symptoms legs burned a little none      Comments 6 MWT results third full day of exercise      Duration -- Progress to 30 minutes of  aerobic without signs/symptoms of physical distress      Intensity -- THRR unchanged        Progression   Progression -- Continue to progress workloads to maintain intensity without signs/symptoms of physical distress.      Average METs -- 2.51        Resistance Training   Training Prescription -- Yes      Weight -- 2 lb      Reps -- 10-15        Interval Training   Interval Training -- No        Treadmill   MPH -- 1.6      Grade -- 0.5      Minutes -- 15      METs -- 2.32        Recumbant Bike   Level -- 1      Minutes -- 15        NuStep   Level --  2      Minutes -- 15      METs -- 2.7        Oxygen   Maintain Oxygen Saturation -- 88% or higher               Exercise Comments:   Exercise Comments     Row Name 09/07/22 1611           Exercise Comments First full day of exercise!  Patient was oriented to gym and equipment including functions, settings, policies, and procedures.  Patient's individual exercise prescription and treatment plan were reviewed.  All starting workloads were established based on the results of the 6 minute walk test done at initial orientation visit.  The plan for exercise progression was also introduced and progression will be customized based on patient's performance and goals.                Exercise Goals and Review:   Exercise Goals     Row Name 08/29/22 1727             Exercise Goals   Increase Physical Activity Yes       Intervention Provide advice, education, support and counseling about physical activity/exercise needs.;Develop an  individualized exercise prescription for aerobic and resistive training based on initial evaluation findings, risk stratification, comorbidities and participant's personal goals.       Expected Outcomes Short Term: Attend rehab on a regular basis to increase amount of physical activity.;Long Term: Add in home exercise to make exercise part of routine and to increase amount of physical activity.;Long Term: Exercising regularly at least 3-5 days a week.       Increase Strength and Stamina Yes       Intervention Provide advice, education, support and counseling about physical activity/exercise needs.;Develop an individualized exercise prescription for aerobic and resistive training based on initial evaluation findings, risk stratification, comorbidities and participant's personal goals.       Expected Outcomes Short Term: Increase workloads from initial exercise prescription for resistance, speed, and METs.;Short Term: Perform resistance training exercises routinely during rehab and add in resistance training at home;Long Term: Improve cardiorespiratory fitness, muscular endurance and strength as measured by increased METs and functional capacity ( )       Able to understand and use rate of perceived exertion (RPE) scale Yes       Intervention Provide education and explanation on how to use RPE scale       Expected Outcomes Short Term: Able to use RPE daily in rehab to express subjective intensity level;Long Term:  Able to use RPE to guide intensity level when exercising independently       Able to understand and use Dyspnea scale Yes       Intervention Provide education and explanation on how to use Dyspnea scale       Expected Outcomes Short Term: Able to use Dyspnea scale daily in rehab to express subjective sense of shortness of breath during exertion;Long Term: Able to use Dyspnea scale to guide intensity level when exercising independently       Knowledge and understanding of Target Heart Rate Range  (THRR) Yes       Intervention Provide education and explanation of THRR including how the numbers were predicted and where they are located for reference       Expected Outcomes Short Term: Able to state/look up THRR;Long Term: Able to use THRR to govern intensity when exercising independently;Short Term: Able to use  daily as guideline for intensity in rehab       Able to check pulse independently Yes       Intervention Provide education and demonstration on how to check pulse in carotid and radial arteries.;Review the importance of being able to check your own pulse for safety during independent exercise       Expected Outcomes Short Term: Able to explain why pulse checking is important during independent exercise;Long Term: Able to check pulse independently and accurately       Understanding of Exercise Prescription Yes       Intervention Provide education, explanation, and written materials on patient's individual exercise prescription       Expected Outcomes Short Term: Able to explain program exercise prescription;Long Term: Able to explain home exercise prescription to exercise independently                Exercise Goals Re-Evaluation :  Exercise Goals Re-Evaluation     Row Name 09/07/22 1613 09/13/22 0857           Exercise Goal Re-Evaluation   Exercise Goals Review Increase Physical Activity;Able to understand and use rate of perceived exertion (RPE) scale;Knowledge and understanding of Target Heart Rate Range (THRR);Understanding of Exercise Prescription;Increase Strength and Stamina;Able to check pulse independently Increase Physical Activity;Increase Strength and Stamina;Understanding of Exercise Prescription      Comments Reviewed RPE  and dyspnea scale, THR and program prescription with pt today.  Pt voiced understanding and was given a copy of goals to take home. Tiffany Munoz is off to a good start in rehab.  Tiffany Munoz has completed her first three full days of rehab thus far.  Tiffany Munoz is  alread up to 2.7 METs on the NuStep.  We will continue to monitor her progress.      Expected Outcomes Short: Use RPE daily to regulate intensity.  Long: Follow program prescription in THR. Short: Continue to to attend rehab regularly Long: Continue to follow program prescription               Discharge Exercise Prescription (Final Exercise Prescription Changes):  Exercise Prescription Changes - 09/13/22 0800       Response to Exercise   Blood Pressure (Admit) 120/76    Blood Pressure (Exercise) 134/70    Blood Pressure (Exit) 128/72    Heart Rate (Admit) 68 bpm    Heart Rate (Exercise) 85 bpm    Heart Rate (Exit) 69 bpm    Rating of Perceived Exertion (Exercise) 13    Symptoms none    Comments third full day of exercise    Duration Progress to 30 minutes of  aerobic without signs/symptoms of physical distress    Intensity THRR unchanged      Progression   Progression Continue to progress workloads to maintain intensity without signs/symptoms of physical distress.    Average METs 2.51      Resistance Training   Training Prescription Yes    Weight 2 lb    Reps 10-15      Interval Training   Interval Training No      Treadmill   MPH 1.6    Grade 0.5    Minutes 15    METs 2.32      Recumbant Bike   Level 1    Minutes 15      NuStep   Level 2    Minutes 15    METs 2.7      Oxygen   Maintain Oxygen Saturation 88%  or higher             Nutrition:  Target Goals: Understanding of nutrition guidelines, daily intake of sodium 1500mg , cholesterol 200mg , calories 30% from fat and 7% or less from saturated fats, daily to have 5 or more servings of fruits and vegetables.  Education: All About Nutrition: -Group instruction provided by verbal, written material, interactive activities, discussions, models, and posters to present general guidelines for heart healthy nutrition including fat, fiber, MyPlate, the role of sodium in heart healthy nutrition, utilization of  the nutrition label, and utilization of this knowledge for meal planning. Follow up email sent as well. Written material given at graduation. Flowsheet Row Cardiac Rehab from 09/14/2022 in [facility] Cardiac and Pulmonary Rehab  Education need identified 08/29/22       Biometrics:  Pre Biometrics - 08/29/22 1727       Pre Biometrics   Height 5\' 5"  (1.651 m)    Weight 109 lb 8 oz (49.7 kg)    Waist Circumference 26.5 inches    Hip Circumference 35 inches    Waist to Hip Ratio 0.76 %    BMI (Calculated) 18.22    Single Leg Stand 30 seconds              Nutrition Therapy Plan and Nutrition Goals:  Nutrition Therapy & Goals - 08/29/22 1728       Intervention Plan   Intervention Prescribe, educate and counsel regarding individualized specific dietary modifications aiming towards targeted core components such as weight, hypertension, lipid management, diabetes, heart failure and other comorbidities.    Expected Outcomes Short Term Goal: Understand basic principles of dietary content, such as calories, fat, sodium, cholesterol and nutrients.;Short Term Goal: A plan has been developed with personal nutrition goals set during dietitian appointment.;Long Term Goal: Adherence to prescribed nutrition plan.             Nutrition Assessments:  MEDIFICTS Score Key: ?70 Need to make dietary changes  40-70 Heart Healthy Diet ? 40 Therapeutic Level Cholesterol Diet  Flowsheet Row Cardiac Rehab from 08/29/2022 in [facility] Cardiac and Pulmonary Rehab  Picture Your Plate Total Score on Admission 50      Picture Your Plate Scores: <16 Unhealthy dietary pattern with much room for improvement. 41-50 Dietary pattern unlikely to meet recommendations for good health and room for improvement. 51-60 More healthful dietary pattern, with some room for improvement.  >60 Healthy dietary pattern, although there may be some specific behaviors that could be improved.    Nutrition Goals  Re-Evaluation:   Nutrition Goals Discharge (Final Nutrition Goals Re-Evaluation):   Psychosocial: Target Goals: Acknowledge presence or absence of significant depression and/or stress, maximize coping skills, provide positive support system. Participant is able to verbalize types and ability to use techniques and skills needed for reducing stress and depression.   Education: Stress, Anxiety, and Depression - Group verbal and visual presentation to define topics covered.  Reviews how body is impacted by stress, anxiety, and depression.  Also discusses healthy ways to reduce stress and to treat/manage anxiety and depression.  Written material given at graduation.   Education: Sleep Hygiene -Provides group verbal and written instruction about how sleep can affect your health.  Define sleep hygiene, discuss sleep cycles and impact of sleep habits. Review good sleep hygiene tips.    Initial Review & Psychosocial Screening:  Initial Psych Review & Screening - 08/24/22 1039       Initial Review   Current issues with None Identified  Family Dynamics   Good Support System? Yes   Daughter, good friend     Barriers   Psychosocial barriers to participate in program There are no identifiable barriers or psychosocial needs.      Screening Interventions   Interventions Encouraged to exercise;To provide support and resources with identified psychosocial needs;Provide feedback about the scores to participant    Expected Outcomes Short Term goal: Utilizing psychosocial counselor, staff and physician to assist with identification of specific Stressors or current issues interfering with healing process. Setting desired goal for each stressor or current issue identified.;Long Term Goal: Stressors or current issues are controlled or eliminated.;Short Term goal: Identification and review with participant of any Quality of Life or Depression concerns found by scoring the questionnaire.;Long Term goal:  The participant improves quality of Life and PHQ9 Scores as seen by post scores and/or verbalization of changes             Quality of Life Scores:   Quality of Life - 08/29/22 1729       Quality of Life   Select Quality of Life      Quality of Life Scores   Health/Function Pre 22.23 %    Socioeconomic Pre 28.21 %    Psych/Spiritual Pre 28.29 %    Family Pre 30 %    GLOBAL Pre 25.85 %            Scores of 19 and below usually indicate a poorer quality of life in these areas.  A difference of  2-3 points is a clinically meaningful difference.  A difference of 2-3 points in the total score of the Quality of Life Index has been associated with significant improvement in overall quality of life, self-image, physical symptoms, and general health in studies assessing change in quality of life.  PHQ-9: Review Flowsheet       08/29/2022  Depression screen PHQ 2/9  Decreased Interest 0  Down, Depressed, Hopeless 0  PHQ - 2 Score 0  Altered sleeping 2  Tired, decreased energy 3  Change in appetite 0  Feeling bad or failure about yourself  0  Trouble concentrating 0  Moving slowly or fidgety/restless 0  Suicidal thoughts 0  PHQ-9 Score 5  Difficult doing work/chores Not difficult at all   Interpretation of Total Score  Total Score Depression Severity:  1-4 = Minimal depression, 5-9 = Mild depression, 10-14 = Moderate depression, 15-19 = Moderately severe depression, 20-27 = Severe depression   Psychosocial Evaluation and Intervention:  Psychosocial Evaluation - 08/24/22 1054       Psychosocial Evaluation & Interventions   Interventions Encouraged to exercise with the program and follow exercise prescription    Comments Tiffany Munoz has no barriers to starting the program. Tiffany Munoz lives alone and has neigbors in the other condos in her complex. Her daughter and a good friend are her support. Tiffany Munoz is ready to stop being a "couch potato". Sh eis concerned about her weight loss and  wants to gain back to her weight of 112 LB. Tiffany Munoz wants to meet with the RD as soon as possible. Tiffany Munoz is ready to get started.    Expected Outcomes STG Tiffany Munoz attends all scheduled sessions, Tiffany Munoz is able to meet with our new RD when he starts  working in the program in early June.  Tiffany Munoz is also working on tobacco cessation, trying to break a 55 year habit. Tiffany Munoz has successfully dropped from 20 cigarettes a day to 2 a day. Her goal is  total cessationLTG Tiffany Munoz has resources and direrction to work on her weight gain and Tiffany Munoz has quit tobacco .    Continue Psychosocial Services  Follow up required by staff             Psychosocial Re-Evaluation:   Psychosocial Discharge (Final Psychosocial Re-Evaluation):   Vocational Rehabilitation: Provide vocational rehab assistance to qualifying candidates.   Vocational Rehab Evaluation & Intervention:   Education: Education Goals: Education classes will be provided on a variety of topics geared toward better understanding of heart health and risk factor modification. Participant will state understanding/return demonstration of topics presented as noted by education test scores.  Learning Barriers/Preferences:  Learning Barriers/Preferences - 08/24/22 1042       Learning Barriers/Preferences   Learning Barriers None    Learning Preferences None             General Cardiac Education Topics:  AED/CPR: - Group verbal and written instruction with the use of models to demonstrate the basic use of the AED with the basic ABC's of resuscitation.   Anatomy and Cardiac Procedures: - Group verbal and visual presentation and models provide information about basic cardiac anatomy and function. Reviews the testing methods done to diagnose heart disease and the outcomes of the test results. Describes the treatment choices: Medical Management, Angioplasty, or Coronary Bypass Surgery for treating various heart conditions including Myocardial Infarction,  Angina, Valve Disease, and Cardiac Arrhythmias.  Written material given at graduation.   Medication Safety: - Group verbal and visual instruction to review commonly prescribed medications for heart and lung disease. Reviews the medication, class of the drug, and side effects. Includes the steps to properly store meds and maintain the prescription regimen.  Written material given at graduation.   Intimacy: - Group verbal instruction through game format to discuss how heart and lung disease can affect sexual intimacy. Written material given at graduation.. Flowsheet Row Cardiac Rehab from 09/14/2022 in [facility] Cardiac and Pulmonary Rehab  Date 09/14/22  Educator [facility]  Instruction Review Code 1- Verbalizes Understanding       Know Your Numbers and Heart Failure: - Group verbal and visual instruction to discuss disease risk factors for cardiac and pulmonary disease and treatment options.  Reviews associated critical values for Overweight/Obesity, Hypertension, Cholesterol, and Diabetes.  Discusses basics of heart failure: signs/symptoms and treatments.  Introduces Heart Failure Zone chart for action plan for heart failure.  Written material given at graduation.   Infection Prevention: - Provides verbal and written material to individual with discussion of infection control including proper hand washing and proper equipment cleaning during exercise session. Flowsheet Row Cardiac Rehab from 09/14/2022 in [facility] Cardiac and Pulmonary Rehab  Date 08/29/22  Educator [facility]  Instruction Review Code 1- Verbalizes Understanding       Falls Prevention: - Provides verbal and written material to individual with discussion of falls prevention and safety. Flowsheet Row Cardiac Rehab from 09/14/2022 in [facility] Cardiac and Pulmonary Rehab  Date 08/29/22  Educator [facility]  Instruction Review Code 1- Verbalizes Understanding       Other: -Provides group and verbal instruction on various topics (see  comments)   Knowledge Questionnaire Score:  Knowledge Questionnaire Score - 08/29/22 1730       Knowledge Questionnaire Score   Pre Score 23/26             Core Components/Risk Factors/Patient Goals at Admission:  Personal Goals and Risk Factors at Admission - 08/29/22 1728       Core  Components/Risk Factors/Patient Goals on Admission    Weight Management Yes;Weight Gain    Intervention Weight Management: Develop a combined nutrition and exercise program designed to reach desired caloric intake, while maintaining appropriate intake of nutrient and fiber, sodium and fats, and appropriate energy expenditure required for the weight goal.;Weight Management: Provide education and appropriate resources to help participant work on and attain dietary goals.    Admit Weight 109 lb 8 oz (49.7 kg)    Goal Weight: Short Term 115 lb (52.2 kg)    Goal Weight: Long Term 115 lb (52.2 kg)    Expected Outcomes Short Term: Continue to assess and modify interventions until short term weight is achieved;Long Term: Adherence to nutrition and physical activity/exercise program aimed toward attainment of established weight goal;Weight Gain: Understanding of general recommendations for a high calorie, high protein meal plan that promotes weight gain by distributing calorie intake throughout the day with the consumption for 4-5 meals, snacks, and/or supplements;Understanding recommendations for meals to include 15-35% energy as protein, 25-35% energy from fat, 35-60% energy from carbohydrates, less than 200mg  of dietary cholesterol, 20-35 gm of total fiber daily;Understanding of distribution of calorie intake throughout the day with the consumption of 4-5 meals/snacks    Tobacco Cessation Yes    Number of packs per day 4 cigarettes a day  working towards cessation.    Tiffany Munoz is a current tobacco user. Intervention for tobacco cessation was provided at the initial medical review. Tiffany Munoz was asked about readiness to  quit and reported that Tiffany Munoz is down to 2 cigarettes a day from a pack. Tiffany Munoz is working towards cessation. No quit date gas been sedt.  . Patient was advised and educated about tobacco cessation using combination therapy, tobacco cessation classes, quit line, and quit smoking apps. Patient demonstrated understanding of this material. Staff will continue to provide encouragement and follow up with the patient throughout the program.    Intervention Assist the participant in steps to quit. Provide individualized education and counseling about committing to Tobacco Cessation, relapse prevention, and pharmacological support that can be provided by physician.;Education officer, environmental, assist with locating and accessing local/national Quit Smoking programs, and support quit date choice.    Expected Outcomes Short Term: Will demonstrate readiness to quit, by selecting a quit date.;Short Term: Will quit all tobacco product use, adhering to prevention of relapse plan.;Long Term: Complete abstinence from all tobacco products for at least 12 months from quit date.    Hypertension Yes    Intervention Provide education on lifestyle modifcations including regular physical activity/exercise, weight management, moderate sodium restriction and increased consumption of fresh fruit, vegetables, and low fat dairy, alcohol moderation, and smoking cessation.;Monitor prescription use compliance.    Expected Outcomes Short Term: Continued assessment and intervention until BP is < 140/55mm HG in hypertensive participants. < 130/46mm HG in hypertensive participants with diabetes, heart failure or chronic kidney disease.;Long Term: Maintenance of blood pressure at goal levels.    Lipids Yes    Intervention Provide education and support for participant on nutrition & aerobic/resistive exercise along with prescribed medications to achieve LDL 70mg , HDL >40mg .    Expected Outcomes Short Term: Participant states understanding of  desired cholesterol values and is compliant with medications prescribed. Participant is following exercise prescription and nutrition guidelines.;Long Term: Cholesterol controlled with medications as prescribed, with individualized exercise RX and with personalized nutrition plan. Value goals: LDL < 70mg , HDL > 40 mg.  Education:Diabetes - Individual verbal and written instruction to review signs/symptoms of diabetes, desired ranges of glucose level fasting, after meals and with exercise. Acknowledge that pre and post exercise glucose checks will be done for 3 sessions at entry of program.   Core Components/Risk Factors/Patient Goals Review:    Core Components/Risk Factors/Patient Goals at Discharge (Final Review):    ITP Comments:  ITP Comments     Row Name 08/24/22 1058 08/29/22 1721 09/07/22 1610 09/21/22 1143     ITP Comments Virtual orientation call completed today. shehas an appointment on Date: 08/29/2022  for EP eval and gym Orientation.  Documentation of diagnosis can be found in [facility]  Date: 07/08/2022 .   Zinda is a current tobacco user. Intervention for tobacco cessation was provided at the initial medical review. Tiffany Munoz was asked about readiness to quit and reported that Tiffany Munoz is down to 2 cigarettes a day from a pack. Tiffany Munoz is working towards cessation. No quit date gas been sedt.  . Patient was advised and educated about tobacco cessation using combination therapy, tobacco cessation classes, quit line, and quit smoking apps. Patient demonstrated understanding of this material. Staff will continue to provide encouragement and follow up with the patient throughout the program. Completed and gym orientation. Initial ITP created and sent for review to Dr. Bethann Punches, Medical Director.Celie is a current tobacco user. Intervention for tobacco cessation was provided at the initial medical review. Tiffany Munoz was asked about readiness to quit and reported Tiffany Munoz is down to 4 cigarettes a  day and desires to quit but has not chosen a quit date yet . Patient was advised and educated about tobacco cessation using combination therapy, tobacco cessation classes, quit line, and quit smoking apps. Patient demonstrated understanding of this material. Staff will continue to provide encouragement and follow up with the patient throughout the program. First full day of exercise!  Patient was oriented to gym and equipment including functions, settings, policies, and procedures.  Patient's individual exercise prescription and treatment plan were reviewed.  All starting workloads were established based on the results of the 6 minute walk test done at initial orientation visit.  The plan for exercise progression was also introduced and progression will be customized based on patient's performance and goals. 30 Day review completed. Medical Director ITP review done, changes made as directed, and signed approval by Medical Director.    new to program             Comments:

## 2022-09-22 ENCOUNTER — Encounter: Payer: Medicare Other | Admitting: *Deleted

## 2022-09-22 DIAGNOSIS — Z955 Presence of coronary angioplasty implant and graft: Secondary | ICD-10-CM

## 2022-09-22 DIAGNOSIS — I214 Non-ST elevation (NSTEMI) myocardial infarction: Secondary | ICD-10-CM | POA: Diagnosis not present

## 2022-09-22 NOTE — Progress Notes (Signed)
Daily Session Note  Patient Details  Name: Tiffany Munoz MRN: [id number] Date of Birth: 04-17-1945 Referring Provider:   Flowsheet Row Cardiac Rehab from 08/29/2022 in [facility] Cardiac and Pulmonary Rehab  Referring Provider Kirke Corin       Encounter Date: 09/22/2022  Check In:  Session Check In - 09/22/22 1554       Check-In   Supervising physician immediately available to respond to emergencies See telemetry face sheet for immediately available ER MD    Location [facility]-Cardiac & Pulmonary Rehab    Staff Present Susann Givens, RN BSN;Joseph [place], RCP,RRT,BSRT;Jessica [place], [place], RCEP, CCRP, CCET    Virtual Visit No    Medication changes reported     No    Fall or balance concerns reported    No    Tobacco Cessation No Change    Current number of cigarettes/nicotine per day     5    Warm-up and Cool-down Performed on first and last piece of equipment    Resistance Training Performed Yes    VAD Patient? No    PAD/SET Patient? No      Pain Assessment   Currently in Pain? No/denies                Social History   Tobacco Use  Smoking Status Every Day   Packs/day: 0.50   Years: 55.00   Additional pack years: 0.00   Total pack years: 27.50   Types: Cigarettes  Smokeless Tobacco Never  Tobacco Comments   2 cigarettes a day working toward cessation    Goals Met:  Independence with exercise equipment Exercise tolerated well No report of concerns or symptoms today Strength training completed today  Goals Unmet:  Not Applicable  Comments: Pt able to follow exercise prescription today without complaint.  Will continue to monitor for progression.    Dr. Bethann Punches is Medical Director for [facility] Cardiac Rehabilitation.  Dr. Vida Rigger is Medical Director for [facility] Pulmonary Rehabilitation.

## 2022-09-26 ENCOUNTER — Encounter: Payer: Medicare Other | Admitting: *Deleted

## 2022-09-26 DIAGNOSIS — Z955 Presence of coronary angioplasty implant and graft: Secondary | ICD-10-CM

## 2022-09-26 DIAGNOSIS — I214 Non-ST elevation (NSTEMI) myocardial infarction: Secondary | ICD-10-CM | POA: Diagnosis not present

## 2022-09-26 NOTE — Progress Notes (Signed)
Daily Session Note  Patient Details  Name: Rosaline Torcivia MRN: [id number] Date of Birth: [birth year] Referring Provider:   Flowsheet Row Cardiac Rehab from 08/29/2022 in [facility] Cardiac and Pulmonary Rehab  Referring Provider Kirke Corin       Encounter Date: 09/26/2022  Check In:  Session Check In - 09/26/22 1559       Check-In   Supervising physician immediately available to respond to emergencies See telemetry face sheet for immediately available ER MD    Location [facility]-Cardiac & Pulmonary Rehab    Staff Present Susann Givens, RN BSN;Joseph [place], RCP,RRT,BSRT;Noah [place], [place], Exercise Physiologist    Virtual Visit No    Medication changes reported     No    Fall or balance concerns reported    No    Tobacco Cessation Use Decreased    Current number of cigarettes/nicotine per day     4    Warm-up and Cool-down Performed on first and last piece of equipment    Resistance Training Performed Yes    VAD Patient? No    PAD/SET Patient? No      Pain Assessment   Currently in Pain? No/denies                Social History   Tobacco Use  Smoking Status Every Day   Packs/day: 0.50   Years: 55.00   Additional pack years: 0.00   Total pack years: 27.50   Types: Cigarettes  Smokeless Tobacco Never  Tobacco Comments   2 cigarettes a day working toward cessation    Goals Met:  Independence with exercise equipment Exercise tolerated well No report of concerns or symptoms today Strength training completed today  Goals Unmet:  Not Applicable  Comments: Pt able to follow exercise prescription today without complaint.  Will continue to monitor for progression.    Dr. Bethann Punches is Medical Director for [facility] Cardiac Rehabilitation.  Dr. Vida Rigger is Medical Director for [facility] Pulmonary Rehabilitation.

## 2022-09-28 ENCOUNTER — Encounter: Payer: Medicare Other | Admitting: *Deleted

## 2022-09-28 DIAGNOSIS — I214 Non-ST elevation (NSTEMI) myocardial infarction: Secondary | ICD-10-CM | POA: Diagnosis not present

## 2022-09-28 DIAGNOSIS — Z955 Presence of coronary angioplasty implant and graft: Secondary | ICD-10-CM

## 2022-09-28 NOTE — Progress Notes (Signed)
Daily Session Note  Patient Details  Name: Tiffany Munoz MRN: [id number] Date of Birth: [birth year] Referring Provider:   Flowsheet Row Cardiac Rehab from 08/29/2022 in [facility] Cardiac and Pulmonary Rehab  Referring Provider Kirke Corin       Encounter Date: 09/28/2022  Check In:  Session Check In - 09/28/22 1607       Check-In   Supervising physician immediately available to respond to emergencies See telemetry face sheet for immediately available ER MD    Location [facility]-Cardiac & Pulmonary Rehab    Staff Present Susann Givens, RN BSN;Joseph [place], [place];Cora Collum, RN, BSN, CCRP    Virtual Visit No    Medication changes reported     No    Fall or balance concerns reported    No    Tobacco Cessation Use Increase    Current number of cigarettes/nicotine per day     6    Warm-up and Cool-down Performed on first and last piece of equipment    Resistance Training Performed Yes    VAD Patient? No    PAD/SET Patient? No      Pain Assessment   Currently in Pain? No/denies                Social History   Tobacco Use  Smoking Status Every Day   Packs/day: 0.50   Years: 55.00   Additional pack years: 0.00   Total pack years: 27.50   Types: Cigarettes  Smokeless Tobacco Never  Tobacco Comments   2 cigarettes a day working toward cessation    Goals Met:  Independence with exercise equipment Exercise tolerated well No report of concerns or symptoms today Strength training completed today  Goals Unmet:  Not Applicable  Comments: Pt able to follow exercise prescription today without complaint.  Will continue to monitor for progression.    Dr. Bethann Punches is Medical Director for [facility] Cardiac Rehabilitation.  Dr. Vida Rigger is Medical Director for [facility] Pulmonary Rehabilitation.

## 2022-09-29 ENCOUNTER — Encounter: Payer: Medicare Other | Admitting: *Deleted

## 2022-09-29 DIAGNOSIS — Z955 Presence of coronary angioplasty implant and graft: Secondary | ICD-10-CM

## 2022-09-29 DIAGNOSIS — I214 Non-ST elevation (NSTEMI) myocardial infarction: Secondary | ICD-10-CM | POA: Diagnosis not present

## 2022-09-29 NOTE — Progress Notes (Signed)
Daily Session Note  Patient Details  Name: Tiffany Munoz MRN: [id number] Date of Birth: September 19, 1945 Referring Provider:   Flowsheet Row Cardiac Rehab from 08/29/2022 in [facility] Cardiac and Pulmonary Rehab  Referring Provider Kirke Corin       Encounter Date: 09/29/2022  Check In:  Session Check In - 09/29/22 1605       Check-In   Supervising physician immediately available to respond to emergencies See telemetry face sheet for immediately available ER MD    Location [facility]-Cardiac & Pulmonary Rehab    Staff Present Susann Givens, RN BSN;Noah Tickle, BS, Exercise Physiologist;Megan Katrinka Blazing, RN, ADN    Virtual Visit No    Medication changes reported     No    Fall or balance concerns reported    No    Tobacco Cessation No Change    Current number of cigarettes/nicotine per day     6    Warm-up and Cool-down Performed on first and last piece of equipment    Resistance Training Performed Yes    VAD Patient? No    PAD/SET Patient? No      Pain Assessment   Currently in Pain? No/denies                Social History   Tobacco Use  Smoking Status Every Day   Packs/day: 0.50   Years: 55.00   Additional pack years: 0.00   Total pack years: 27.50   Types: Cigarettes  Smokeless Tobacco Never  Tobacco Comments   2 cigarettes a day working toward cessation    Goals Met:  Independence with exercise equipment Exercise tolerated well No report of concerns or symptoms today Strength training completed today  Goals Unmet:  Not Applicable  Comments: Pt able to follow exercise prescription today without complaint.  Will continue to monitor for progression.    Dr. Bethann Punches is Medical Director for [facility] Cardiac Rehabilitation.  Dr. Vida Rigger is Medical Director for [facility] Pulmonary Rehabilitation.

## 2022-10-03 ENCOUNTER — Encounter: Payer: Medicare Other | Admitting: *Deleted

## 2022-10-03 DIAGNOSIS — Z955 Presence of coronary angioplasty implant and graft: Secondary | ICD-10-CM

## 2022-10-03 DIAGNOSIS — I214 Non-ST elevation (NSTEMI) myocardial infarction: Secondary | ICD-10-CM

## 2022-10-03 NOTE — Progress Notes (Signed)
Daily Session Note  Patient Details  Name: Tiffany Munoz MRN: [id number] Date of Birth: 07-24-1945 Referring Provider:   Flowsheet Row Cardiac Rehab from 08/29/2022 in [facility] Cardiac and Pulmonary Rehab  Referring Provider Kirke Corin       Encounter Date: 10/03/2022  Check In:  Session Check In - 10/03/22 1603       Check-In   Supervising physician immediately available to respond to emergencies See telemetry face sheet for immediately available ER MD    Location [facility]-Cardiac & Pulmonary Rehab    Staff Present Susann Givens, RN BSN;Laureen Manson Passey, BS, RRT, CPFT;Noah Tickle, BS, Exercise Physiologist    Virtual Visit No    Medication changes reported     No    Fall or balance concerns reported    No    Current number of cigarettes/nicotine per day     6    Warm-up and Cool-down Performed on first and last piece of equipment    Resistance Training Performed Yes    VAD Patient? No    PAD/SET Patient? No      Pain Assessment   Currently in Pain? No/denies                Social History   Tobacco Use  Smoking Status Every Day   Packs/day: 0.50   Years: 55.00   Additional pack years: 0.00   Total pack years: 27.50   Types: Cigarettes  Smokeless Tobacco Never  Tobacco Comments   2 cigarettes a day working toward cessation    Goals Met:  Independence with exercise equipment Exercise tolerated well No report of concerns or symptoms today Strength training completed today  Goals Unmet:  Not Applicable  Comments: Pt able to follow exercise prescription today without complaint.  Will continue to monitor for progression.    Dr. Bethann Punches is Medical Director for [facility] Cardiac Rehabilitation.  Dr. Vida Rigger is Medical Director for [facility] Pulmonary Rehabilitation.

## 2022-10-05 ENCOUNTER — Encounter: Payer: Medicare Other | Admitting: *Deleted

## 2022-10-05 DIAGNOSIS — I214 Non-ST elevation (NSTEMI) myocardial infarction: Secondary | ICD-10-CM

## 2022-10-05 DIAGNOSIS — Z955 Presence of coronary angioplasty implant and graft: Secondary | ICD-10-CM

## 2022-10-05 NOTE — Progress Notes (Signed)
Daily Session Note  Patient Details  Name: Tiffany Munoz MRN: [id number] Date of Birth: 06-13-1945 Referring Provider:   Flowsheet Row Cardiac Rehab from 08/29/2022 in [facility] Cardiac and Pulmonary Rehab  Referring Provider Arida       Encounter Date: 10/05/2022  Check In:  Session Check In - 10/05/22 1615       Check-In   Supervising physician immediately available to respond to emergencies See telemetry face sheet for immediately available ER MD    Location [facility]-Cardiac & Pulmonary Rehab    Staff Present Susann Givens, RN BSN;Laureen Manson Passey, BS, RRT, CPFT;Kelly Madilyn Fireman, BS, ACSM CEP, Exercise Physiologist    Virtual Visit No    Medication changes reported     No    Fall or balance concerns reported    No    Tobacco Cessation No Change    Current number of cigarettes/nicotine per day     6    Warm-up and Cool-down Performed on first and last piece of equipment    Resistance Training Performed Yes    VAD Patient? No    PAD/SET Patient? No      Pain Assessment   Currently in Pain? No/denies                Social History   Tobacco Use  Smoking Status Every Day   Packs/day: 0.50   Years: 55.00   Additional pack years: 0.00   Total pack years: 27.50   Types: Cigarettes  Smokeless Tobacco Never  Tobacco Comments   2 cigarettes a day working toward cessation    Goals Met:  Independence with exercise equipment Exercise tolerated well No report of concerns or symptoms today Strength training completed today  Goals Unmet:  Not Applicable  Comments: Pt able to follow exercise prescription today without complaint.  Will continue to monitor for progression.    Dr. Bethann Punches is Medical Director for [facility] Cardiac Rehabilitation.  Dr. Vida Rigger is Medical Director for [facility] Pulmonary Rehabilitation.

## 2022-10-06 ENCOUNTER — Encounter: Payer: Medicare Other | Admitting: *Deleted

## 2022-10-06 DIAGNOSIS — I214 Non-ST elevation (NSTEMI) myocardial infarction: Secondary | ICD-10-CM | POA: Diagnosis not present

## 2022-10-06 DIAGNOSIS — Z955 Presence of coronary angioplasty implant and graft: Secondary | ICD-10-CM

## 2022-10-06 NOTE — Progress Notes (Signed)
Daily Session Note  Patient Details  Name: Tiffany Munoz MRN: [id number] Date of Birth: 1945-08-01 Referring Provider:   Flowsheet Row Cardiac Rehab from 08/29/2022 in [facility] Cardiac and Pulmonary Rehab  Referring Provider Arida       Encounter Date: 10/06/2022  Check In:  Session Check In - 10/06/22 1604       Check-In   Supervising physician immediately available to respond to emergencies See telemetry face sheet for immediately available ER MD    Location [facility]-Cardiac & Pulmonary Rehab    Staff Present Susann Givens, RN BSN;Joseph [place], RCP,RRT,BSRT;Noah [place], [place], Exercise Physiologist    Virtual Visit No    Medication changes reported     No    Fall or balance concerns reported    No    Tobacco Cessation No Change    Current number of cigarettes/nicotine per day     6    Warm-up and Cool-down Performed on first and last piece of equipment    Resistance Training Performed Yes    VAD Patient? No    PAD/SET Patient? No      Pain Assessment   Currently in Pain? No/denies                Social History   Tobacco Use  Smoking Status Every Day   Packs/day: 0.50   Years: 55.00   Additional pack years: 0.00   Total pack years: 27.50   Types: Cigarettes  Smokeless Tobacco Never  Tobacco Comments   2 cigarettes a day working toward cessation    Goals Met:  Independence with exercise equipment Exercise tolerated well No report of concerns or symptoms today Strength training completed today  Goals Unmet:  Not Applicable  Comments: Pt able to follow exercise prescription today without complaint.  Will continue to monitor for progression.    Dr. Bethann Punches is Medical Director for [facility] Cardiac Rehabilitation.  Dr. Vida Rigger is Medical Director for [facility] Pulmonary Rehabilitation.

## 2022-10-10 ENCOUNTER — Encounter: Payer: Medicare Other | Attending: Cardiovascular Disease | Admitting: *Deleted

## 2022-10-10 DIAGNOSIS — Z955 Presence of coronary angioplasty implant and graft: Secondary | ICD-10-CM | POA: Insufficient documentation

## 2022-10-10 DIAGNOSIS — I214 Non-ST elevation (NSTEMI) myocardial infarction: Secondary | ICD-10-CM

## 2022-10-10 DIAGNOSIS — I252 Old myocardial infarction: Secondary | ICD-10-CM | POA: Diagnosis not present

## 2022-10-10 DIAGNOSIS — Z48812 Encounter for surgical aftercare following surgery on the circulatory system: Secondary | ICD-10-CM | POA: Diagnosis not present

## 2022-10-10 NOTE — Progress Notes (Signed)
Daily Session Note  Patient Details  Name: Tiffany Munoz MRN: [id number] Date of Birth: [birth year] Referring Provider:   Flowsheet Row Cardiac Rehab from 08/29/2022 in [facility] Cardiac and Pulmonary Rehab  Referring Provider Arida       Encounter Date: 10/10/2022  Check In:  Session Check In - 10/10/22 1614       Check-In   Supervising physician immediately available to respond to emergencies See telemetry face sheet for immediately available ER MD    Location [facility]-Cardiac & Pulmonary Rehab    Staff Present Susann Givens, RN BSN;Joseph [place], RCP,RRT,BSRT;Kelly [place], [place], ACSM CEP, Exercise Physiologist    Virtual Visit No    Medication changes reported     No    Fall or balance concerns reported    No    Tobacco Cessation No Change    Current number of cigarettes/nicotine per day     6    Warm-up and Cool-down Performed on first and last piece of equipment    Resistance Training Performed Yes    VAD Patient? No    PAD/SET Patient? No      Pain Assessment   Currently in Pain? No/denies                Social History   Tobacco Use  Smoking Status Every Day   Packs/day: 0.50   Years: 55.00   Additional pack years: 0.00   Total pack years: 27.50   Types: Cigarettes  Smokeless Tobacco Never  Tobacco Comments   2 cigarettes a day working toward cessation    Goals Met:  Independence with exercise equipment Exercise tolerated well No report of concerns or symptoms today Strength training completed today  Goals Unmet:  Not Applicable  Comments: Pt able to follow exercise prescription today without complaint.  Will continue to monitor for progression.    Dr. Bethann Punches is Medical Director for [facility] Cardiac Rehabilitation.  Dr. Vida Rigger is Medical Director for [facility] Pulmonary Rehabilitation.

## 2022-10-12 ENCOUNTER — Encounter: Payer: Medicare Other | Admitting: *Deleted

## 2022-10-12 DIAGNOSIS — Z955 Presence of coronary angioplasty implant and graft: Secondary | ICD-10-CM | POA: Diagnosis not present

## 2022-10-12 DIAGNOSIS — I214 Non-ST elevation (NSTEMI) myocardial infarction: Secondary | ICD-10-CM

## 2022-10-12 NOTE — Progress Notes (Signed)
Daily Session Note  Patient Details  Name: Tiffany Munoz MRN: [id number] Date of Birth: Feb 13, 1946 Referring Provider:   Flowsheet Row Cardiac Rehab from 08/29/2022 in [facility] Cardiac and Pulmonary Rehab  Referring Provider Kirke Corin       Encounter Date: 10/12/2022  Check In:  Session Check In - 10/12/22 1610       Check-In   Supervising physician immediately available to respond to emergencies See telemetry face sheet for immediately available ER MD    Location [facility]-Cardiac & Pulmonary Rehab    Staff Present Hulen Luster, BS, RRT, CPFT;Megan Katrinka Blazing, RN, ADN;Doreena Maulden Jewel Baize, RN BSN    Virtual Visit No    Medication changes reported     No    Fall or balance concerns reported    No    Tobacco Cessation No Change    Current number of cigarettes/nicotine per day     6    Warm-up and Cool-down Performed on first and last piece of equipment    Resistance Training Performed Yes    VAD Patient? No    PAD/SET Patient? No      Pain Assessment   Currently in Pain? No/denies                Social History   Tobacco Use  Smoking Status Every Day   Packs/day: 0.50   Years: 55.00   Additional pack years: 0.00   Total pack years: 27.50   Types: Cigarettes  Smokeless Tobacco Never  Tobacco Comments   2 cigarettes a day working toward cessation    Goals Met:  Independence with exercise equipment Exercise tolerated well No report of concerns or symptoms today Strength training completed today  Goals Unmet:  Not Applicable  Comments: Pt able to follow exercise prescription today without complaint.  Will continue to monitor for progression.    Dr. Bethann Punches is Medical Director for [facility] Cardiac Rehabilitation.  Dr. Vida Rigger is Medical Director for [facility] Pulmonary Rehabilitation.

## 2022-10-17 ENCOUNTER — Encounter: Payer: Medicare Other | Admitting: *Deleted

## 2022-10-17 DIAGNOSIS — I214 Non-ST elevation (NSTEMI) myocardial infarction: Secondary | ICD-10-CM

## 2022-10-17 DIAGNOSIS — Z955 Presence of coronary angioplasty implant and graft: Secondary | ICD-10-CM | POA: Diagnosis not present

## 2022-10-17 NOTE — Progress Notes (Signed)
Daily Session Note  Patient Details  Name: Tiffany Munoz MRN: [id number] Date of Birth: 03-19-1946 Referring Provider:   Flowsheet Row Cardiac Rehab from 08/29/2022 in [facility] Cardiac and Pulmonary Rehab  Referring Provider Kirke Corin       Encounter Date: 10/17/2022  Check In:  Session Check In - 10/17/22 1606       Check-In   Supervising physician immediately available to respond to emergencies See telemetry face sheet for immediately available ER MD    Location [facility]-Cardiac & Pulmonary Rehab    Staff Present Hulen Luster, BS, RRT, CPFT;Susanne Bice, RN, BSN, CCRP;Starling Jessie Jewel Baize, RN BSN    Virtual Visit No    Medication changes reported     No    Fall or balance concerns reported    No    Tobacco Cessation Use Decreased    Current number of cigarettes/nicotine per day     5    Warm-up and Cool-down Performed on first and last piece of equipment    Resistance Training Performed Yes    VAD Patient? No    PAD/SET Patient? No      Pain Assessment   Currently in Pain? No/denies                Social History   Tobacco Use  Smoking Status Every Day   Packs/day: 0.50   Years: 55.00   Additional pack years: 0.00   Total pack years: 27.50   Types: Cigarettes  Smokeless Tobacco Never  Tobacco Comments   2 cigarettes a day working toward cessation    Goals Met:  Independence with exercise equipment Exercise tolerated well No report of concerns or symptoms today Strength training completed today  Goals Unmet:  Not Applicable  Comments: Pt able to follow exercise prescription today without complaint.  Will continue to monitor for progression.    Dr. Bethann Punches is Medical Director for [facility] Cardiac Rehabilitation.  Dr. Vida Rigger is Medical Director for [facility] Pulmonary Rehabilitation.

## 2022-10-18 ENCOUNTER — Encounter: Payer: Self-pay | Admitting: *Deleted

## 2022-10-18 DIAGNOSIS — Z955 Presence of coronary angioplasty implant and graft: Secondary | ICD-10-CM

## 2022-10-18 DIAGNOSIS — I214 Non-ST elevation (NSTEMI) myocardial infarction: Secondary | ICD-10-CM

## 2022-10-18 NOTE — Progress Notes (Signed)
Cardiac Individual Treatment Plan  Patient Details  Name: Zani Kyllonen MRN: [id number] Date of Birth: 1946-03-15 Referring Provider:   Flowsheet Row Cardiac Rehab from 08/29/2022 in [facility] Cardiac and Pulmonary Rehab  Referring Provider Arida       Initial Encounter Date:  Flowsheet Row Cardiac Rehab from 08/29/2022 in [facility] - Inpatient Cardiac and Pulmonary Rehab  Date 08/29/22       Visit Diagnosis: Status post coronary artery stent placement  NSTEMI (non-ST elevation myocardial infarction) (HCC)  Patient's Home Medications on Admission:  Current Outpatient Medications:    amLODipine (NORVASC) 5 MG tablet, TAKE 1 TABLET(5 MG) BY MOUTH EVERY EVENING, Disp: 30 tablet, Rfl: 2   aspirin 81 MG tablet, Take 1-2 tablets by mouth daily., Disp: , Rfl:    carvedilol (COREG) 3.125 MG tablet, Take 1 tablet (3.125 mg total) by mouth 2 (two) times daily with a meal., Disp: 30 tablet, Rfl: 2   cetirizine (ZYRTEC ALLERGY) 10 MG tablet, Take 1 tablet by mouth daily as needed for rhinitis or allergies., Disp: , Rfl:    clobetasol cream (TEMOVATE) 0.05 %, Apply 1 Application topically 3 (three) times daily as needed., Disp: , Rfl:    Evolocumab (REPATHA SURECLICK) 140 MG/ML SOAJ, Inject 140 mg into the skin every 14 (fourteen) days., Disp: 2 mL, Rfl: 2   nitroGLYCERIN (NITROSTAT) 0.4 MG SL tablet, Place 1 tablet (0.4 mg total) under the tongue every 5 (five) minutes x 3 doses as needed for chest pain., Disp: 30 tablet, Rfl: 2   potassium chloride (K-DUR,KLOR-CON) 10 MEQ tablet, Take 1 tablet by mouth daily., Disp: , Rfl:    rosuvastatin (CRESTOR) 40 MG tablet, Take 1 tablet (40 mg total) by mouth daily. (Patient not taking: Reported on 08/24/2022), Disp: 30 tablet, Rfl: 2   sodium fluoride (PREVIDENT) 1.1 % GEL dental gel, Take 1 application by mouth daily. (Patient not taking: Reported on 08/02/2022), Disp: , Rfl:    traMADol (ULTRAM) 50 MG tablet, Take 50 mg by mouth every 8 (eight) hours as needed for severe pain  or moderate pain., Disp: , Rfl:   Past Medical History: Past Medical History:  Diagnosis Date   AAA (abdominal aortic aneurysm) (HCC)    Hypertension    Migraine     Tobacco Use: Social History   Tobacco Use  Smoking Status Every Day   Packs/day: 0.50   Years: 55.00   Additional pack years: 0.00   Total pack years: 27.50   Types: Cigarettes  Smokeless Tobacco Never  Tobacco Comments   2 cigarettes a day working toward cessation    Labs: Review Flowsheet       Latest Ref Rng & Units 03/29/2018 07/09/2022  Labs for ITP Cardiac and Pulmonary Rehab  Cholestrol 0 - 200 mg/dL - 657   LDL (calc) 0 - 99 mg/dL - 846   HDL-C >96 mg/dL - 67   Trlycerides <295 mg/dL - 83   Hemoglobin M8U 4.8 - 5.6 % - 5.1   TCO2 22 - 32 mmol/L 31  -     Exercise Target Goals: Exercise Program Goal: Individual exercise prescription set using results from initial 6 min walk test and THRR while considering  patient's activity barriers and safety.   Exercise Prescription Goal: Initial exercise prescription builds to 30-45 minutes a day of aerobic activity, 2-3 days per week.  Home exercise guidelines will be given to patient during program as part of exercise prescription that the participant will acknowledge.   Education: Aerobic Exercise: -  Group verbal and visual presentation on the components of exercise prescription. Introduces F.I.T.T principle from ACSM for exercise prescriptions.  Reviews F.I.T.T. principles of aerobic exercise including progression. Written material given at graduation. Flowsheet Row Cardiac Rehab from 10/05/2022 in [facility] Cardiac and Pulmonary Rehab  Education need identified 08/29/22  Date 09/14/22  Educator [facility]  Instruction Review Code 1- [employer] Understanding       Education: Resistance Exercise: - Group verbal and visual presentation on the components of exercise prescription. Introduces F.I.T.T principle from ACSM for exercise prescriptions  Reviews F.I.T.T.  principles of resistance exercise including progression. Written material given at graduation.    Education: Exercise & Equipment Safety: - Individual verbal instruction and demonstration of equipment use and safety with use of the equipment. Flowsheet Row Cardiac Rehab from 10/05/2022 in [facility] Cardiac and Pulmonary Rehab  Date 08/29/22  Educator [facility]  Instruction Review Code 1- Verbalizes Understanding       Education: Exercise Physiology & General Exercise Guidelines: - Group verbal and written instruction with models to review the exercise physiology of the cardiovascular system and associated critical values. Provides general exercise guidelines with specific guidelines to those with heart or lung disease.  Flowsheet Row Cardiac Rehab from 10/05/2022 in [facility] Cardiac and Pulmonary Rehab  Education need identified 08/29/22       Education: Flexibility, Balance, Mind/Body Relaxation: - Group verbal and visual presentation with interactive activity on the components of exercise prescription. Introduces F.I.T.T principle from ACSM for exercise prescriptions. Reviews F.I.T.T. principles of flexibility and balance exercise training including progression. Also discusses the mind body connection.  Reviews various relaxation techniques to help reduce and manage stress (i.e. Deep breathing, progressive muscle relaxation, and visualization). Balance handout provided to take home. Written material given at graduation. Flowsheet Row Cardiac Rehab from 10/05/2022 in [facility] Cardiac and Pulmonary Rehab  Date 09/28/22  Educator [facility]  Instruction Review Code 1- Verbalizes Understanding       Activity Barriers & Risk Stratification:  Activity Barriers & Cardiac Risk Stratification - 08/29/22 1724       Activity Barriers & Cardiac Risk Stratification   Activity Barriers None    Cardiac Risk Stratification High             6 Minute Walk:  6 Minute Walk     Row Name 08/29/22 1723         6  Minute Walk   Phase Initial     Distance 1000 feet     Walk Time 6 minutes     # of Rest Breaks 0     MPH 1.89     METS 2.33     RPE 12     Perceived Dyspnea  1     VO2 Peak 8.16     Symptoms Yes (comment)     Comments legs burning feeling during walk test     Resting HR 68 bpm     Resting BP 122/70     Resting Oxygen Saturation  99 %     Exercise Oxygen Saturation  during 6 min walk 97 %     Max Ex. HR 77 bpm     Max Ex. BP 128/70     2 Minute Post BP 110/60              Oxygen Initial Assessment:   Oxygen Re-Evaluation:   Oxygen Discharge (Final Oxygen Re-Evaluation):   Initial Exercise Prescription:  Initial Exercise Prescription - 08/29/22 1700  Date of Initial Exercise RX and Referring Provider   Date 08/29/22    Referring Provider Arida      Oxygen   Maintain Oxygen Saturation 88% or higher      Treadmill   MPH 1.5    Grade 0.5    Minutes 15    METs 2.33      NuStep   Level 2    SPM 80    Minutes 15    METs 2.33      Recumbant Elliptical   Level 1    RPM 50    Minutes 15    METs 2.33      Biostep-RELP   Level 1    SPM 50    Minutes 15    METs 2.33      Track   Laps 25    Minutes 15    METs 2.36      Prescription Details   Frequency (times per week) 3    Duration Progress to 30 minutes of continuous aerobic without signs/symptoms of physical distress      Intensity   THRR 40-80% of Max Heartrate 98-129    Ratings of Perceived Exertion 11-13    Perceived Dyspnea 0-4      Progression   Progression Continue to progress workloads to maintain intensity without signs/symptoms of physical distress.      Resistance Training   Training Prescription Yes    Weight 2    Reps 10-15             Perform Capillary Blood Glucose checks as needed.  Exercise Prescription Changes:   Exercise Prescription Changes     Row Name 08/29/22 1700 09/13/22 0800 09/28/22 0700 10/12/22 1500       Response to Exercise   Blood  Pressure (Admit) 122/70 120/76 122/64 102/70    Blood Pressure (Exercise) 128/70 134/70 140/62 140/68    Blood Pressure (Exit) 110/60 128/72 122/66 110/60    Heart Rate (Admit) 68 bpm 68 bpm 69 bpm 70 bpm    Heart Rate (Exercise) 77 bpm 85 bpm 95 bpm 96 bpm    Heart Rate (Exit) 71 bpm 69 bpm 77 bpm 74 bpm    Oxygen Saturation (Admit) 99 % -- -- --    Oxygen Saturation (Exercise) 97 % -- -- --    Oxygen Saturation (Exit) 95 % -- -- --    Rating of Perceived Exertion (Exercise) 12 13 13 13     Perceived Dyspnea (Exercise) 1 -- -- --    Symptoms legs burned a little none none none    Comments 6 MWT results third full day of exercise -- --    Duration -- Progress to 30 minutes of  aerobic without signs/symptoms of physical distress Continue with 30 min of aerobic exercise without signs/symptoms of physical distress. Continue with 30 min of aerobic exercise without signs/symptoms of physical distress.    Intensity -- THRR unchanged THRR unchanged THRR unchanged      Progression   Progression -- Continue to progress workloads to maintain intensity without signs/symptoms of physical distress. Continue to progress workloads to maintain intensity without signs/symptoms of physical distress. Continue to progress workloads to maintain intensity without signs/symptoms of physical distress.    Average METs -- 2.51 2.14 2.33      Resistance Training   Training Prescription -- Yes Yes Yes    Weight -- 2 lb 3 lb 3 lb    Reps -- 10-15 10-15 10-15  Interval Training   Interval Training -- No No No      Treadmill   MPH -- 1.6 2 1.9    Grade -- 0.5 2 2     Minutes -- 15 15 15     METs -- 2.32 3.08 2.98      Recumbant Bike   Level -- 1 -- 1    Watts -- -- -- 25    Minutes -- 15 -- 15    METs -- -- -- 1.4      NuStep   Level -- 2 2 2     Minutes -- 15 15 15     METs -- 2.7 1.3 1.6      REL-XR   Level -- -- 3 --    Minutes -- -- 15 --    METs -- -- 1.5 --      T5 Nustep   Level -- -- 2 --     Minutes -- -- 15 --    METs -- -- 1.7 --      Biostep-RELP   Level -- -- 1 2    Minutes -- -- 15 15    METs -- -- 2 2      Track   Laps -- -- 25 --    Minutes -- -- 15 --    METs -- -- 2.36 --      Oxygen   Maintain Oxygen Saturation -- 88% or higher 88% or higher 88% or higher             Exercise Comments:   Exercise Comments     Row Name 09/07/22 1611           Exercise Comments First full day of exercise!  Patient was oriented to gym and equipment including functions, settings, policies, and procedures.  Patient's individual exercise prescription and treatment plan were reviewed.  All starting workloads were established based on the results of the 6 minute walk test done at initial orientation visit.  The plan for exercise progression was also introduced and progression will be customized based on patient's performance and goals.                Exercise Goals and Review:   Exercise Goals     Row Name 08/29/22 1727             Exercise Goals   Increase Physical Activity Yes       Intervention Provide advice, education, support and counseling about physical activity/exercise needs.;Develop an individualized exercise prescription for aerobic and resistive training based on initial evaluation findings, risk stratification, comorbidities and participant's personal goals.       Expected Outcomes Short Term: Attend rehab on a regular basis to increase amount of physical activity.;Long Term: Add in home exercise to make exercise part of routine and to increase amount of physical activity.;Long Term: Exercising regularly at least 3-5 days a week.       Increase Strength and Stamina Yes       Intervention Provide advice, education, support and counseling about physical activity/exercise needs.;Develop an individualized exercise prescription for aerobic and resistive training based on initial evaluation findings, risk stratification, comorbidities and participant's  personal goals.       Expected Outcomes Short Term: Increase workloads from initial exercise prescription for resistance, speed, and METs.;Short Term: Perform resistance training exercises routinely during rehab and add in resistance training at home;Long Term: Improve cardiorespiratory fitness, muscular endurance and strength as measured by increased METs and functional  capacity ( )       Able to understand and use rate of perceived exertion (RPE) scale Yes       Intervention Provide education and explanation on how to use RPE scale       Expected Outcomes Short Term: Able to use RPE daily in rehab to express subjective intensity level;Long Term:  Able to use RPE to guide intensity level when exercising independently       Able to understand and use Dyspnea scale Yes       Intervention Provide education and explanation on how to use Dyspnea scale       Expected Outcomes Short Term: Able to use Dyspnea scale daily in rehab to express subjective sense of shortness of breath during exertion;Long Term: Able to use Dyspnea scale to guide intensity level when exercising independently       Knowledge and understanding of Target Heart Rate Range (THRR) Yes       Intervention Provide education and explanation of THRR including how the numbers were predicted and where they are located for reference       Expected Outcomes Short Term: Able to state/look up THRR;Long Term: Able to use THRR to govern intensity when exercising independently;Short Term: Able to use daily as guideline for intensity in rehab       Able to check pulse independently Yes       Intervention Provide education and demonstration on how to check pulse in carotid and radial arteries.;Review the importance of being able to check your own pulse for safety during independent exercise       Expected Outcomes Short Term: Able to explain why pulse checking is important during independent exercise;Long Term: Able to check pulse independently and  accurately       Understanding of Exercise Prescription Yes       Intervention Provide education, explanation, and written materials on patient's individual exercise prescription       Expected Outcomes Short Term: Able to explain program exercise prescription;Long Term: Able to explain home exercise prescription to exercise independently                Exercise Goals Re-Evaluation :  Exercise Goals Re-Evaluation     Row Name 09/07/22 1613 09/13/22 0857 09/28/22 0757 10/12/22 1532 10/12/22 1533     Exercise Goal Re-Evaluation   Exercise Goals Review Increase Physical Activity;Able to understand and use rate of perceived exertion (RPE) scale;Knowledge and understanding of Target Heart Rate Range (THRR);Understanding of Exercise Prescription;Increase Strength and Stamina;Able to check pulse independently Increase Physical Activity;Increase Strength and Stamina;Understanding of Exercise Prescription Increase Physical Activity;Increase Strength and Stamina;Understanding of Exercise Prescription Increase Physical Activity;Increase Strength and Stamina;Understanding of Exercise Prescription --   Comments Reviewed RPE  and dyspnea scale, THR and program prescription with pt today.  Pt voiced understanding and was given a copy of goals to take home. Mariavictoria is off to a good start in rehab.  She has completed her first three full days of rehab thus far.  She is alread up to 2.7 METs on the NuStep.  We will continue to monitor her progress. Tora is doing well in the program. She increased her treadmill workload up to 2 mph with a 2% incline. She also improved to level 3 on the XR and level 2 on the T5 nustep. She increased to 3 lb hand weights for resistance training as well. Yury continues to do well with hter exercise progression strength and stamina. Sandra continues to  do well with her exercise progression strength and stamina. She has increased the Biostep to level 2  and increased her treadmill from  1.7/2% to 1.9/2%and is maintaining all other workloads.  Will continue to monitor her progression.   Expected Outcomes Short: Use RPE daily to regulate intensity.  Long: Follow program prescription in THR. Short: Continue to to attend rehab regularly Long: Continue to follow program prescription Short: Continue to progressively increase treadmill workload. Long: Continue to improve strength and stamina. -- STG  continue to increse workloads as tolerated. LTG continue exercie progression to help improve strength and stamina            Discharge Exercise Prescription (Final Exercise Prescription Changes):  Exercise Prescription Changes - 10/12/22 1500       Response to Exercise   Blood Pressure (Admit) 102/70    Blood Pressure (Exercise) 140/68    Blood Pressure (Exit) 110/60    Heart Rate (Admit) 70 bpm    Heart Rate (Exercise) 96 bpm    Heart Rate (Exit) 74 bpm    Rating of Perceived Exertion (Exercise) 13    Symptoms none    Duration Continue with 30 min of aerobic exercise without signs/symptoms of physical distress.    Intensity THRR unchanged      Progression   Progression Continue to progress workloads to maintain intensity without signs/symptoms of physical distress.    Average METs 2.33      Resistance Training   Training Prescription Yes    Weight 3 lb    Reps 10-15      Interval Training   Interval Training No      Treadmill   MPH 1.9    Grade 2    Minutes 15    METs 2.98      Recumbant Bike   Level 1    Watts 25    Minutes 15    METs 1.4      NuStep   Level 2    Minutes 15    METs 1.6      Biostep-RELP   Level 2    Minutes 15    METs 2      Oxygen   Maintain Oxygen Saturation 88% or higher             Nutrition:  Target Goals: Understanding of nutrition guidelines, daily intake of sodium 1500mg , cholesterol 200mg , calories 30% from fat and 7% or less from saturated fats, daily to have 5 or more servings of fruits and  vegetables.  Education: All About Nutrition: -Group instruction provided by verbal, written material, interactive activities, discussions, models, and posters to present general guidelines for heart healthy nutrition including fat, fiber, MyPlate, the role of sodium in heart healthy nutrition, utilization of the nutrition label, and utilization of this knowledge for meal planning. Follow up email sent as well. Written material given at graduation. Flowsheet Row Cardiac Rehab from 10/05/2022 in [facility] Cardiac and Pulmonary Rehab  Education need identified 08/29/22       Biometrics:  Pre Biometrics - 08/29/22 1727       Pre Biometrics   Height 5\' 5"  (1.651 m)    Weight 109 lb 8 oz (49.7 kg)    Waist Circumference 26.5 inches    Hip Circumference 35 inches    Waist to Hip Ratio 0.76 %    BMI (Calculated) 18.22    Single Leg Stand 30 seconds  Nutrition Therapy Plan and Nutrition Goals:  Nutrition Therapy & Goals - 08/29/22 1728       Intervention Plan   Intervention Prescribe, educate and counsel regarding individualized specific dietary modifications aiming towards targeted core components such as weight, hypertension, lipid management, diabetes, heart failure and other comorbidities.    Expected Outcomes Short Term Goal: Understand basic principles of dietary content, such as calories, fat, sodium, cholesterol and nutrients.;Short Term Goal: A plan has been developed with personal nutrition goals set during dietitian appointment.;Long Term Goal: Adherence to prescribed nutrition plan.             Nutrition Assessments:  MEDIFICTS Score Key: ?70 Need to make dietary changes  40-70 Heart Healthy Diet ? 40 Therapeutic Level Cholesterol Diet  Flowsheet Row Cardiac Rehab from 08/29/2022 in [facility] Cardiac and Pulmonary Rehab  Picture Your Plate Total Score on Admission 50      Picture Your Plate Scores: <56 Unhealthy dietary pattern with much room for  improvement. 41-50 Dietary pattern unlikely to meet recommendations for good health and room for improvement. 51-60 More healthful dietary pattern, with some room for improvement.  >60 Healthy dietary pattern, although there may be some specific behaviors that could be improved.    Nutrition Goals Re-Evaluation:   Nutrition Goals Discharge (Final Nutrition Goals Re-Evaluation):   Psychosocial: Target Goals: Acknowledge presence or absence of significant depression and/or stress, maximize coping skills, provide positive support system. Participant is able to verbalize types and ability to use techniques and skills needed for reducing stress and depression.   Education: Stress, Anxiety, and Depression - Group verbal and visual presentation to define topics covered.  Reviews how body is impacted by stress, anxiety, and depression.  Also discusses healthy ways to reduce stress and to treat/manage anxiety and depression.  Written material given at graduation.   Education: Sleep Hygiene -Provides group verbal and written instruction about how sleep can affect your health.  Define sleep hygiene, discuss sleep cycles and impact of sleep habits. Review good sleep hygiene tips.    Initial Review & Psychosocial Screening:  Initial Psych Review & Screening - 08/24/22 1039       Initial Review   Current issues with None Identified      Family Dynamics   Good Support System? Yes   Daughter, good friend     Barriers   Psychosocial barriers to participate in program There are no identifiable barriers or psychosocial needs.      Screening Interventions   Interventions Encouraged to exercise;To provide support and resources with identified psychosocial needs;Provide feedback about the scores to participant    Expected Outcomes Short Term goal: Utilizing psychosocial counselor, staff and physician to assist with identification of specific Stressors or current issues interfering with healing  process. Setting desired goal for each stressor or current issue identified.;Long Term Goal: Stressors or current issues are controlled or eliminated.;Short Term goal: Identification and review with participant of any Quality of Life or Depression concerns found by scoring the questionnaire.;Long Term goal: The participant improves quality of Life and PHQ9 Scores as seen by post scores and/or verbalization of changes             Quality of Life Scores:   Quality of Life - 08/29/22 1729       Quality of Life   Select Quality of Life      Quality of Life Scores   Health/Function Pre 22.23 %    Socioeconomic Pre 28.21 %  Psych/Spiritual Pre 28.29 %    Family Pre 30 %    GLOBAL Pre 25.85 %            Scores of 19 and below usually indicate a poorer quality of life in these areas.  A difference of  2-3 points is a clinically meaningful difference.  A difference of 2-3 points in the total score of the Quality of Life Index has been associated with significant improvement in overall quality of life, self-image, physical symptoms, and general health in studies assessing change in quality of life.  PHQ-9: Review Flowsheet       09/28/2022 08/29/2022  Depression screen PHQ 2/9  Decreased Interest 0 0  Down, Depressed, Hopeless 0 0  PHQ - 2 Score 0 0  Altered sleeping 2 2  Tired, decreased energy 1 3  Change in appetite 0 0  Feeling bad or failure about yourself  0 0  Trouble concentrating 0 0  Moving slowly or fidgety/restless 0 0  Suicidal thoughts 0 0  PHQ-9 Score 3 5  Difficult doing work/chores Not difficult at all Not difficult at all   Interpretation of Total Score  Total Score Depression Severity:  1-4 = Minimal depression, 5-9 = Mild depression, 10-14 = Moderate depression, 15-19 = Moderately severe depression, 20-27 = Severe depression   Psychosocial Evaluation and Intervention:  Psychosocial Evaluation - 08/24/22 1054       Psychosocial Evaluation &  Interventions   Interventions Encouraged to exercise with the program and follow exercise prescription    Comments Reality has no barriers to starting the program. She lives alone and has neigbors in the other condos in her complex. Her daughter and a good friend are her support. SHe is ready to stop being a "couch potato". Sh eis concerned about her weight loss and wants to gain back to her weight of 112 LB. She wants to meet with the RD as soon as possible. She is ready to get started.    Expected Outcomes STG Latashia attends all scheduled sessions, she is able to meet with our new RD when he starts  working in the program in early June.  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 She has successfully dropped from 20 cigarettes a day to 2 a day. Her goal is total cessationLTG Arilyn has resources and direrction to work on her weight gain and she has quit tobacco .    Continue Psychosocial Services  Follow up required by staff             Psychosocial Re-Evaluation:   Psychosocial Discharge (Final Psychosocial Re-Evaluation):   Vocational Rehabilitation: Provide vocational rehab assistance to qualifying candidates.   Vocational Rehab Evaluation & Intervention:   Education: Education Goals: Education classes will be provided on a variety of topics geared toward better understanding of heart health and risk factor modification. Participant will state understanding/return demonstration of topics presented as noted by education test scores.  Learning Barriers/Preferences:  Learning Barriers/Preferences - 08/24/22 1042       Learning Barriers/Preferences   Learning Barriers None    Learning Preferences None             General Cardiac Education Topics:  AED/CPR: - Group verbal and written instruction with the use of models to demonstrate the basic use of the AED with the basic ABC's of resuscitation.   Anatomy and Cardiac Procedures: - Group  verbal and visual presentation and models provide information  about basic cardiac anatomy and function. Reviews the testing methods done to diagnose heart disease and the outcomes of the test results. Describes the treatment choices: Medical Management, Angioplasty, or Coronary Bypass Surgery for treating various heart conditions including Myocardial Infarction, Angina, Valve Disease, and Cardiac Arrhythmias.  Written material given at graduation.   Medication Safety: - Group verbal and visual instruction to review commonly prescribed medications for heart and lung disease. Reviews the medication, class of the drug, and side effects. Includes the steps to properly store meds and maintain the prescription regimen.  Written material given at graduation.   Intimacy: - Group verbal instruction through game format to discuss how heart and lung disease can affect sexual intimacy. Written material given at graduation.. Flowsheet Row Cardiac Rehab from 10/05/2022 in [facility] Cardiac and Pulmonary Rehab  Date 09/14/22  Educator [facility]  Instruction Review Code 1- Verbalizes Understanding       Know Your Numbers and Heart Failure: - Group verbal and visual instruction to discuss disease risk factors for cardiac and pulmonary disease and treatment options.  Reviews associated critical values for Overweight/Obesity, Hypertension, Cholesterol, and Diabetes.  Discusses basics of heart failure: signs/symptoms and treatments.  Introduces Heart Failure Zone chart for action plan for heart failure.  Written material given at graduation.   Infection Prevention: - Provides verbal and written material to individual with discussion of infection control including proper hand washing and proper equipment cleaning during exercise session. Flowsheet Row Cardiac Rehab from 10/05/2022 in [facility] Cardiac and Pulmonary Rehab  Date 08/29/22  Educator [facility]  Instruction Review Code 1- Verbalizes Understanding       Falls  Prevention: - Provides verbal and written material to individual with discussion of falls prevention and safety. Flowsheet Row Cardiac Rehab from 10/05/2022 in [facility] Cardiac and Pulmonary Rehab  Date 08/29/22  Educator [facility]  Instruction Review Code 1- Verbalizes Understanding       Other: -Provides group and verbal instruction on various topics (see comments) Flowsheet Row Cardiac Rehab from 10/05/2022 in [facility] Cardiac and Pulmonary Rehab  Date 09/21/22  Educator [facility]  [10/05/22- [facility]]  Instruction Review Code 1- Verbalizes Understanding       Knowledge Questionnaire Score:  Knowledge Questionnaire Score - 08/29/22 1730       Knowledge Questionnaire Score   Pre Score 23/26             Core Components/Risk Factors/Patient Goals at Admission:  Personal Goals and Risk Factors at Admission - 08/29/22 1728       Core Components/Risk Factors/Patient Goals on Admission    Weight Management Yes;Weight Gain    Intervention Weight Management: Develop a combined nutrition and exercise program designed to reach desired caloric intake, while maintaining appropriate intake of nutrient and fiber, sodium and fats, and appropriate energy expenditure required for the weight goal.;Weight Management: Provide education and appropriate resources to help participant work on and attain dietary goals.    Admit Weight 109 lb 8 oz (49.7 kg)    Goal Weight: Short Term 115 lb (52.2 kg)    Goal Weight: Long Term 115 lb (52.2 kg)    Expected Outcomes Short Term: Continue to assess and modify interventions until short term weight is achieved;Long Term: Adherence to nutrition and physical activity/exercise program aimed toward attainment of established weight goal;Weight Gain: Understanding of general recommendations for a high calorie, high protein meal plan that promotes weight gain by distributing calorie intake throughout the day with the consumption for 4-5 meals, snacks, and/or  supplements;Understanding recommendations for meals to include 15-35% energy as protein, 25-35% energy from fat, 35-60% energy from carbohydrates, less than 200mg  of dietary cholesterol, 20-35 gm of total fiber daily;Understanding of distribution of calorie intake throughout the day with the consumption of 4-5 meals/snacks    Tobacco Cessation Yes    Number of packs per day 4 cigarettes a day  working towards cessation.    Valeria is a current tobacco user. Intervention for tobacco cessation was provided at the initial medical review. She was asked about readiness to quit and reported that she is down to 2 cigarettes a day from a pack. She is working towards cessation. No quit date gas been sedt.  . Patient was advised and educated about tobacco cessation using combination therapy, tobacco cessation classes, quit line, and quit smoking apps. Patient demonstrated understanding of this material. Staff will continue to provide encouragement and follow up with the patient throughout the program.    Intervention Assist the participant in steps to quit. Provide individualized education and counseling about committing to Tobacco Cessation, relapse prevention, and pharmacological support that can be provided by physician.;Education officer, environmental, assist with locating and accessing local/national Quit Smoking programs, and support quit date choice.    Expected Outcomes Short Term: Will demonstrate readiness to quit, by selecting a quit date.;Short Term: Will quit all tobacco product use, adhering to prevention of relapse plan.;Long Term: Complete abstinence from all tobacco products for at least 12 months from quit date.    Hypertension Yes    Intervention Provide education on lifestyle modifcations including regular physical activity/exercise, weight management, moderate sodium restriction and increased consumption of fresh fruit, vegetables, and low fat dairy, alcohol moderation, and smoking cessation.;Monitor  prescription use compliance.    Expected Outcomes Short Term: Continued assessment and intervention until BP is < 140/48mm HG in hypertensive participants. < 130/59mm HG in hypertensive participants with diabetes, heart failure or chronic kidney disease.;Long Term: Maintenance of blood pressure at goal levels.    Lipids Yes    Intervention Provide education and support for participant on nutrition & aerobic/resistive exercise along with prescribed medications to achieve LDL 70mg , HDL >40mg .    Expected Outcomes Short Term: Participant states understanding of desired cholesterol values and is compliant with medications prescribed. Participant is following exercise prescription and nutrition guidelines.;Long Term: Cholesterol controlled with medications as prescribed, with individualized exercise RX and with personalized nutrition plan. Value goals: LDL < 70mg , HDL > 40 mg.             Education:Diabetes - Individual verbal and written instruction to review signs/symptoms of diabetes, desired ranges of glucose level fasting, after meals and with exercise. Acknowledge that pre and post exercise glucose checks will be done for 3 sessions at entry of program.   Core Components/Risk Factors/Patient Goals Review:    Core Components/Risk Factors/Patient Goals at Discharge (Final Review):    ITP Comments:  ITP Comments     Row Name 08/24/22 1058 08/29/22 1721 09/07/22 1610 09/21/22 1143 10/18/22 1517   ITP Comments Virtual orientation call completed today. shehas an appointment on Date: 08/29/2022  for EP eval and gym Orientation.  Documentation of diagnosis [facility] be found in [facility]  Date: 07/08/2022 .   Kiera is a current tobacco user. Intervention for tobacco cessation was provided at the initial medical review. She was asked about readiness to quit and reported that she is down to 2 cigarettes a day from a pack. She is working towards cessation. No quit date  gas been sedt.  . Patient was advised and  educated about tobacco cessation using combination therapy, tobacco cessation classes, quit line, and quit smoking apps. Patient demonstrated understanding of this material. Staff will continue to provide encouragement and follow up with the patient throughout the program. Completed and gym orientation. Initial ITP created and sent for review to Dr. Bethann Punches, Medical Director.Lyndia is a current tobacco user. Intervention for tobacco cessation was provided at the initial medical review. She was asked about readiness to quit and reported she is down to 4 cigarettes a day and desires to quit but has not chosen a quit date yet . Patient was advised and educated about tobacco cessation using combination therapy, tobacco cessation classes, quit line, and quit smoking apps. Patient demonstrated understanding of this material. Staff will continue to provide encouragement and follow up with the patient throughout the program. First full day of exercise!  Patient was oriented to gym and equipment including functions, settings, policies, and procedures.  Patient's individual exercise prescription and treatment plan were reviewed.  All starting workloads were established based on the results of the 6 minute walk test done at initial orientation visit.  The plan for exercise progression was also introduced and progression will be customized based on patient's performance and goals. 30 Day review completed. Medical Director ITP review done, changes made as directed, and signed approval by Medical Director.    new to program 30 Day review completed. Medical Director ITP review done, changes made as directed, and signed approval by Medical Director.            Comments:

## 2022-10-19 ENCOUNTER — Encounter: Payer: Medicare Other | Admitting: *Deleted

## 2022-10-19 DIAGNOSIS — Z955 Presence of coronary angioplasty implant and graft: Secondary | ICD-10-CM

## 2022-10-19 DIAGNOSIS — I214 Non-ST elevation (NSTEMI) myocardial infarction: Secondary | ICD-10-CM

## 2022-10-19 NOTE — Progress Notes (Signed)
Daily Session Note  Patient Details  Name: Tiffany Munoz MRN: [id number] Date of Birth: 1945/08/28 Referring Provider:   Flowsheet Row Cardiac Rehab from 08/29/2022 in [facility] Cardiac and Pulmonary Rehab  Referring Provider Arida       Encounter Date: 10/19/2022  Check In:  Session Check In - 10/19/22 1612       Check-In   Supervising physician immediately available to respond to emergencies See telemetry face sheet for immediately available ER MD    Location [facility]-Cardiac & Pulmonary Rehab    Staff Present Susann Givens, RN BSN;Joseph [place], RCP,RRT,BSRT;Laureen [place], [place], RRT, CPFT    Virtual Visit No    Medication changes reported     No    Fall or balance concerns reported    No    Tobacco Cessation No Change    Current number of cigarettes/nicotine per day     5    Warm-up and Cool-down Performed on first and last piece of equipment    Resistance Training Performed Yes    VAD Patient? No    PAD/SET Patient? No      Pain Assessment   Currently in Pain? No/denies                Social History   Tobacco Use  Smoking Status Every Day   Packs/day: 0.50   Years: 55.00   Additional pack years: 0.00   Total pack years: 27.50   Types: Cigarettes  Smokeless Tobacco Never  Tobacco Comments   2 cigarettes a day working toward cessation    Goals Met:  Independence with exercise equipment Exercise tolerated well No report of concerns or symptoms today Strength training completed today  Goals Unmet:  Not Applicable  Comments: Pt able to follow exercise prescription today without complaint.  Will continue to monitor for progression.    Dr. Bethann Punches is Medical Director for [facility] Cardiac Rehabilitation.  Dr. Vida Rigger is Medical Director for [facility] Pulmonary Rehabilitation.

## 2022-10-20 ENCOUNTER — Encounter: Payer: Medicare Other | Admitting: *Deleted

## 2022-10-20 DIAGNOSIS — Z955 Presence of coronary angioplasty implant and graft: Secondary | ICD-10-CM | POA: Diagnosis not present

## 2022-10-20 DIAGNOSIS — I214 Non-ST elevation (NSTEMI) myocardial infarction: Secondary | ICD-10-CM

## 2022-10-20 NOTE — Progress Notes (Signed)
Daily Session Note  Patient Details  Name: Tiffany Munoz MRN: [id number] Date of Birth: 01-02-1946 Referring Provider:   Flowsheet Row Cardiac Rehab from 08/29/2022 in [facility] Cardiac and Pulmonary Rehab  Referring Provider Kirke Corin       Encounter Date: 10/20/2022  Check In:  Session Check In - 10/20/22 1343       Check-In   Supervising physician immediately available to respond to emergencies See telemetry face sheet for immediately available ER MD    Location [facility]-Cardiac & Pulmonary Rehab    Staff Present Lanny Hurst, RN, ADN;Joseph [place], RCP,RRT,BSRT;Other   Rory Percy, MS   Virtual Visit No    Medication changes reported     No    Fall or balance concerns reported    No    Tobacco Cessation No Change    Warm-up and Cool-down Performed on first and last piece of equipment    Resistance Training Performed Yes    VAD Patient? No    PAD/SET Patient? No      Pain Assessment   Currently in Pain? No/denies                Social History   Tobacco Use  Smoking Status Every Day   Current packs/day: 0.50   Average packs/day: 0.5 packs/day for 55.0 years (27.5 ttl pk-yrs)   Types: Cigarettes  Smokeless Tobacco Never  Tobacco Comments   2 cigarettes a day working toward cessation    Goals Met:  Independence with exercise equipment Exercise tolerated well No report of concerns or symptoms today Strength training completed today  Goals Unmet:  Not Applicable  Comments: Pt able to follow exercise prescription today without complaint.  Will continue to monitor for progression.    Dr. Bethann Punches is Medical Director for [facility] Cardiac Rehabilitation.  Dr. Vida Rigger is Medical Director for [facility] Pulmonary Rehabilitation.

## 2022-10-26 ENCOUNTER — Encounter: Payer: Medicare Other | Admitting: *Deleted

## 2022-10-26 DIAGNOSIS — Z955 Presence of coronary angioplasty implant and graft: Secondary | ICD-10-CM | POA: Diagnosis not present

## 2022-10-26 NOTE — Progress Notes (Signed)
Daily Session Note  Patient Details  Name: Tiffany Munoz MRN: [id number] Date of Birth: 1945/05/11 Referring Provider:   Flowsheet Row Cardiac Rehab from 08/29/2022 in [facility] Cardiac and Pulmonary Rehab  Referring Provider Kirke Corin       Encounter Date: 10/26/2022  Check In:  Session Check In - 10/26/22 1625       Check-In   Supervising physician immediately available to respond to emergencies See telemetry face sheet for immediately available ER MD    Location [facility]-Cardiac & Pulmonary Rehab    Staff Present Hulen Luster, BS, RRT, CPFT;Nevaeh Korte Jewel Baize, RN BSN;Joseph Reino [place], RCP,RRT,BSRT    Virtual Visit No    Medication changes reported     No    Fall or balance concerns reported    No    Tobacco Cessation No Change    Current number of cigarettes/nicotine per day     5    Warm-up and Cool-down Performed on first and last piece of equipment    Resistance Training Performed Yes    VAD Patient? No    PAD/SET Patient? No      Pain Assessment   Currently in Pain? No/denies                Social History   Tobacco Use  Smoking Status Every Day   Current packs/day: 0.50   Average packs/day: 0.5 packs/day for 55.0 years (27.5 ttl pk-yrs)   Types: Cigarettes  Smokeless Tobacco Never  Tobacco Comments   2 cigarettes a day working toward cessation    Goals Met:  Independence with exercise equipment Exercise tolerated well No report of concerns or symptoms today Strength training completed today  Goals Unmet:  Not Applicable  Comments: Pt able to follow exercise prescription today without complaint.  Will continue to monitor for progression.    Dr. Bethann Punches is Medical Director for [facility] Cardiac Rehabilitation.  Dr. Vida Rigger is Medical Director for [facility] Pulmonary Rehabilitation.

## 2022-10-27 ENCOUNTER — Encounter: Payer: Medicare Other | Admitting: *Deleted

## 2022-10-27 DIAGNOSIS — I214 Non-ST elevation (NSTEMI) myocardial infarction: Secondary | ICD-10-CM

## 2022-10-27 DIAGNOSIS — Z955 Presence of coronary angioplasty implant and graft: Secondary | ICD-10-CM | POA: Diagnosis not present

## 2022-10-27 NOTE — Progress Notes (Signed)
Daily Session Note  Patient Details  Name: Jerlene Rockers MRN: [id number] Date of Birth: 09-Nov-1945 Referring Provider:   Flowsheet Row Cardiac Rehab from 08/29/2022 in [facility] Cardiac and Pulmonary Rehab  Referring Provider Kirke Corin       Encounter Date: 10/27/2022  Check In:  Session Check In - 10/27/22 1558       Check-In   Supervising physician immediately available to respond to emergencies See telemetry face sheet for immediately available ER MD    Location [facility]-Cardiac & Pulmonary Rehab    Staff Present Susann Givens, RN BSN;Joseph [place], [place];Cora Collum, RN, BSN, CCRP    Virtual Visit No    Medication changes reported     No    Fall or balance concerns reported    No    Tobacco Cessation No Change    Current number of cigarettes/nicotine per day     5    Warm-up and Cool-down Performed on first and last piece of equipment    Resistance Training Performed Yes    VAD Patient? No    PAD/SET Patient? No      Pain Assessment   Currently in Pain? No/denies                Social History   Tobacco Use  Smoking Status Every Day   Current packs/day: 0.50   Average packs/day: 0.5 packs/day for 55.0 years (27.5 ttl pk-yrs)   Types: Cigarettes  Smokeless Tobacco Never  Tobacco Comments   2 cigarettes a day working toward cessation    Goals Met:  Independence with exercise equipment Exercise tolerated well No report of concerns or symptoms today Strength training completed today  Goals Unmet:  Not Applicable  Comments: Pt able to follow exercise prescription today without complaint.  Will continue to monitor for progression.    Dr. Bethann Punches is Medical Director for [facility] Cardiac Rehabilitation.  Dr. Vida Rigger is Medical Director for [facility] Pulmonary Rehabilitation.

## 2022-10-31 ENCOUNTER — Encounter: Payer: Medicare Other | Admitting: *Deleted

## 2022-10-31 DIAGNOSIS — Z955 Presence of coronary angioplasty implant and graft: Secondary | ICD-10-CM | POA: Diagnosis not present

## 2022-10-31 DIAGNOSIS — I214 Non-ST elevation (NSTEMI) myocardial infarction: Secondary | ICD-10-CM

## 2022-10-31 NOTE — Progress Notes (Signed)
Daily Session Note  Patient Details  Name: Tiffany Munoz MRN: [id number] Date of Birth: December 01, 1945 Referring Provider:   Flowsheet Row Cardiac Rehab from 08/29/2022 in [facility] Cardiac and Pulmonary Rehab  Referring Provider Arida       Encounter Date: 10/31/2022  Check In:  Session Check In - 10/31/22 1621       Check-In   Supervising physician immediately available to respond to emergencies See telemetry face sheet for immediately available ER MD    Location [facility]-Cardiac & Pulmonary Rehab    Staff Present Susann Givens, RN BSN;Joseph [place], RCP,RRT,BSRT;Laureen [place], [place], RRT, CPFT    Virtual Visit No    Medication changes reported     No    Fall or balance concerns reported    No    Tobacco Cessation No Change    Current number of cigarettes/nicotine per day     5    Warm-up and Cool-down Performed on first and last piece of equipment    Resistance Training Performed Yes    VAD Patient? No    PAD/SET Patient? No      Pain Assessment   Currently in Pain? No/denies                Social History   Tobacco Use  Smoking Status Every Day   Current packs/day: 0.50   Average packs/day: 0.5 packs/day for 55.0 years (27.5 ttl pk-yrs)   Types: Cigarettes  Smokeless Tobacco Never  Tobacco Comments   2 cigarettes a day working toward cessation    Goals Met:  Independence with exercise equipment Exercise tolerated well No report of concerns or symptoms today Strength training completed today  Goals Unmet:  Not Applicable  Comments: Pt able to follow exercise prescription today without complaint.  Will continue to monitor for progression.'    Dr. Bethann Punches is Medical Director for [facility] Cardiac Rehabilitation.  Dr. Vida Rigger is Medical Director for [facility] Pulmonary Rehabilitation.

## 2022-11-02 ENCOUNTER — Encounter: Payer: Medicare Other | Admitting: *Deleted

## 2022-11-02 DIAGNOSIS — Z955 Presence of coronary angioplasty implant and graft: Secondary | ICD-10-CM | POA: Diagnosis not present

## 2022-11-02 DIAGNOSIS — I214 Non-ST elevation (NSTEMI) myocardial infarction: Secondary | ICD-10-CM

## 2022-11-02 NOTE — Progress Notes (Signed)
Daily Session Note  Patient Details  Name: Tiffany Munoz MRN: [id number] Date of Birth: 10-24-1945 Referring Provider:   Flowsheet Row Cardiac Rehab from 08/29/2022 in [facility] Cardiac and Pulmonary Rehab  Referring Provider Kirke Corin       Encounter Date: 11/02/2022  Check In:  Session Check In - 11/02/22 1606       Check-In   Supervising physician immediately available to respond to emergencies See telemetry face sheet for immediately available ER MD    Location [facility]-Cardiac & Pulmonary Rehab    Staff Present Susann Givens, RN Atilano Median, RN, ADN   Girtha Rm and Max Conetta   Virtual Visit No    Medication changes reported     No    Fall or balance concerns reported    No    Tobacco Cessation No Change    Current number of cigarettes/nicotine per day     5    Warm-up and Cool-down Performed on first and last piece of equipment    Resistance Training Performed Yes    VAD Patient? No    PAD/SET Patient? No      Pain Assessment   Currently in Pain? No/denies                Social History   Tobacco Use  Smoking Status Every Day   Current packs/day: 0.50   Average packs/day: 0.5 packs/day for 55.0 years (27.5 ttl pk-yrs)   Types: Cigarettes  Smokeless Tobacco Never  Tobacco Comments   2 cigarettes a day working toward cessation    Goals Met:  Independence with exercise equipment Exercise tolerated well No report of concerns or symptoms today Strength training completed today  Goals Unmet:  Not Applicable  Comments: Pt able to follow exercise prescription today without complaint.  Will continue to monitor for progression.    Dr. Bethann Punches is Medical Director for [facility] Cardiac Rehabilitation.  Dr. Vida Rigger is Medical Director for [facility] Pulmonary Rehabilitation.

## 2022-11-03 ENCOUNTER — Encounter: Payer: Medicare Other | Admitting: *Deleted

## 2022-11-03 DIAGNOSIS — Z955 Presence of coronary angioplasty implant and graft: Secondary | ICD-10-CM | POA: Diagnosis not present

## 2022-11-03 DIAGNOSIS — I214 Non-ST elevation (NSTEMI) myocardial infarction: Secondary | ICD-10-CM

## 2022-11-03 NOTE — Progress Notes (Signed)
Daily Session Note  Patient Details  Name: Trenee Igoe MRN: [id number] Date of Birth: 03-05-1946 Referring Provider:   Flowsheet Row Cardiac Rehab from 08/29/2022 in [facility] Cardiac and Pulmonary Rehab  Referring Provider Arida       Encounter Date: 11/03/2022  Check In:  Session Check In - 11/03/22 1605       Check-In   Supervising physician immediately available to respond to emergencies See telemetry face sheet for immediately available ER MD    Location [facility]-Cardiac & Pulmonary Rehab    Staff Present Elige Ko, RCP,RRT,BSRT;Hjalmar Ballengee Jewel Baize, RN BSN;Other   Girtha Rm and Max Conetta   Virtual Visit No    Medication changes reported     No    Fall or balance concerns reported    No    Tobacco Cessation No Change    Current number of cigarettes/nicotine per day     5    Warm-up and Cool-down Performed on first and last piece of equipment    Resistance Training Performed Yes    VAD Patient? No    PAD/SET Patient? No      Pain Assessment   Currently in Pain? No/denies                Social History   Tobacco Use  Smoking Status Every Day   Current packs/day: 0.50   Average packs/day: 0.5 packs/day for 55.0 years (27.5 ttl pk-yrs)   Types: Cigarettes  Smokeless Tobacco Never  Tobacco Comments   2 cigarettes a day working toward cessation    Goals Met:  Independence with exercise equipment Exercise tolerated well No report of concerns or symptoms today Strength training completed today  Goals Unmet:  Not Applicable  Comments: Pt able to follow exercise prescription today without complaint.  Will continue to monitor for progression.    Dr. Bethann Punches is Medical Director for [facility] Cardiac Rehabilitation.  Dr. Vida Rigger is Medical Director for [facility] Pulmonary Rehabilitation.

## 2022-11-06 ENCOUNTER — Other Ambulatory Visit: Payer: Self-pay | Admitting: Physician Assistant

## 2022-11-07 ENCOUNTER — Encounter: Payer: Medicare Other | Admitting: *Deleted

## 2022-11-07 ENCOUNTER — Other Ambulatory Visit: Payer: Self-pay

## 2022-11-07 DIAGNOSIS — Z955 Presence of coronary angioplasty implant and graft: Secondary | ICD-10-CM | POA: Diagnosis not present

## 2022-11-07 DIAGNOSIS — I214 Non-ST elevation (NSTEMI) myocardial infarction: Secondary | ICD-10-CM

## 2022-11-07 MED ORDER — CLOPIDOGREL BISULFATE 75 MG PO TABS: 75.0000 mg | ORAL_TABLET | Freq: Every day | ORAL | 0 refills | Status: DC

## 2022-11-07 NOTE — Progress Notes (Signed)
Daily Session Note  Patient Details  Name: Tiffany Munoz MRN: [id number] Date of Birth: 06-28-1945 Referring Provider:   Flowsheet Row Cardiac Rehab from 08/29/2022 in [facility] Cardiac and Pulmonary Rehab  Referring Provider Arida       Encounter Date: 11/07/2022  Check In:  Session Check In - 11/07/22 1603       Check-In   Supervising physician immediately available to respond to emergencies See telemetry face sheet for immediately available ER MD    Location [facility]-Cardiac & Pulmonary Rehab    Staff Present Susann Givens, RN BSN;Joseph Quinnesec, RCP,RRT,BSRT;Other   Max Conetta   Virtual Visit No    Medication changes reported     No    Fall or balance concerns reported    No    Tobacco Cessation No Change    Current number of cigarettes/nicotine per day     5    Warm-up and Cool-down Performed on first and last piece of equipment    Resistance Training Performed Yes    VAD Patient? No    PAD/SET Patient? No      Pain Assessment   Currently in Pain? No/denies                Social History   Tobacco Use  Smoking Status Every Day   Current packs/day: 0.50   Average packs/day: 0.5 packs/day for 55.0 years (27.5 ttl pk-yrs)   Types: Cigarettes  Smokeless Tobacco Never  Tobacco Comments   2 cigarettes a day working toward cessation    Goals Met:  Independence with exercise equipment Exercise tolerated well No report of concerns or symptoms today Strength training completed today  Goals Unmet:  Not Applicable  Comments: Pt able to follow exercise prescription today without complaint.  Will continue to monitor for progression.    Dr. Bethann Punches is Medical Director for [facility] Cardiac Rehabilitation.  Dr. Vida Rigger is Medical Director for [facility] Pulmonary Rehabilitation.

## 2022-11-07 NOTE — Telephone Encounter (Signed)
4/23 note: START Repatha 140 mg every 2 weeks. Once you start this STOP rosuvastatin (Crestor).  Next visit 7/31  Plavix initiated in 4/27 phone note:  Please advise the patient interventional cardiology has weighed in on the patient's antiplatelet therapy.  Discontinue ticagrelor.  Start clopidogrel 300 mg x 1 dose, followed by 75 mg daily the next day.    Sent rx for maintenance Plavix to pharmacy.

## 2022-11-08 NOTE — Progress Notes (Unsigned)
Cardiology Office Note    Date:  11/09/2022   ID:  Tiffany Munoz, DOB [birth year], MRN [id number]  PCP:  Dorothey Baseman, MD  Cardiologist:  Debbe Odea, MD  Electrophysiologist:  None   Chief Complaint: Follow up  History of Present Illness:   Tiffany Munoz is a 77 y.o. 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   She was previously followed by Dr. Lady Gary.  Remote echo from [facility] in 2019 showed an EF of 65 to 70%, grade 1 diastolic dysfunction, degenerative mitral valve, moderate LVH, aortic sclerosis, and normal RV systolic function.  Nuclear stress testing in 2020 showed no evidence of ischemia or scar with an EF of 65%.  With regards to her PAD, aortic ulcerative disease, and AAA, she is followed by [facility] vascular surgery, last seeing them in 05/2022 with the largest thoracic aortic measurement noted to be 4.7 cm at that time and with an infrarenal abdominal aorta measuring 3.3 cm with recommendation for her to follow up with them in 11/2022.    She was admitted to [facility] on 3/29 through 07/12/2022 chest pressure at rest that radiated to her back and felt similar to what she experienced when she was diagnosed with her aortic disease.  High-sensitivity troponin peaked at 3195.  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  Echo showed an EF of 55 to 60%, no regional wall motion abnormalities, mild LVH, grade 1 diastolic dysfunction, normal RV systolic function and ventricular cavity size, aortic valve sclerosis without evidence of stenosis, and an estimated  right atrial pressure of 3 mmHg.  LHC showed the culprit vessel seemed to be the LCx with an occluded posterior AV groove branch with collaterals as well as significant distal and proximal stenosis along with an aneurysmal vessel especially in the midsegment.  The LAD had moderate disease that was not significant by FFR.  D2 had significant disease, though was small in size.  The rPDA also had significant mid stenosis, though was small in size.  She underwent successful PCI/DES to the distal and proximal LCx.  She was last seen in hospital follow-up on 08/02/2018 and was without symptoms of angina or cardiac decompensation.  She reported a history of myalgias with possible myositis on daily statin therapy and was transition from rosuvastatin to Repatha.  Following her last visit, she contacted our office noting a black eye without trauma.  In this setting she was transition from ticagrelor to clopidogrel.  She comes in doing well from a cardiac perspective and is without symptoms of angina or cardiac decompensation.  She is participating in, and enjoying, cardiac rehab.  She will be continuing regular exercise with gym.  No dizziness, presyncope, or syncope.  She was unclear if she should continue taking carvedilol or not.  No falls or symptoms concerning for bleeding.  Adherent to DAPT without issues.  Will be working with a Data processing manager.  Has been working on lifestyle modification.  Has not needed any as needed SL NTG.  Tolerating Repatha.   Labs independently reviewed: 10/2022 - TC 182, TG 133, HDL 61, LDL 94,  Hgb 14.2, PLT 249, potassium 3.6, BUN 19, serum creatinine 0.8, albumin 4.3, AST/ALT normal 07/2022 - A1c 5.1, LP(a) 415 01/2022 - TSH normal  Past Medical History:  Diagnosis Date   AAA (abdominal aortic aneurysm) (HCC)    Hypertension    Migraine     Past Surgical History:  Procedure Laterality Date   CESAREAN SECTION     CORONARY PRESSURE/FFR STUDY N/A 07/11/2022   Procedure: INTRAVASCULAR  PRESSURE WIRE/FFR STUDY;  Surgeon: Iran Ouch, MD;  Location: [facility] INVASIVE CV LAB;  Service: Cardiovascular;  Laterality: N/A;   CORONARY STENT INTERVENTION N/A 07/11/2022   Procedure: CORONARY STENT INTERVENTION;  Surgeon: Iran Ouch, MD;  Location: [facility] INVASIVE CV LAB;  Service: Cardiovascular;  Laterality: N/A;   EXTERNAL EAR SURGERY     LEFT HEART CATH AND CORONARY ANGIOGRAPHY N/A 07/11/2022   Procedure: LEFT HEART CATH AND CORONARY ANGIOGRAPHY;  Surgeon: Iran Ouch, MD;  Location: [facility] INVASIVE CV LAB;  Service: Cardiovascular;  Laterality: N/A;   TYMPANOSTOMY TUBE PLACEMENT     UTERINE FIBROID SURGERY      Current Medications: Current Meds  Medication Sig   aspirin 81 MG tablet Take 1-2 tablets by mouth daily.   cetirizine (ZYRTEC ALLERGY) 10 MG tablet Take 1 tablet by mouth daily as needed for rhinitis or allergies.   clobetasol cream (TEMOVATE) 0.05 % Apply 1 Application topically 3 (three) times daily as needed.   nitroGLYCERIN (NITROSTAT) 0.4 MG SL tablet Place 1 tablet (0.4 mg total) under the tongue every 5 (five) minutes x 3 doses as needed for chest pain.   potassium chloride (K-DUR,KLOR-CON) 10 MEQ tablet Take 1 tablet by mouth daily.   [DISCONTINUED] amLODipine (NORVASC) 5 MG tablet TAKE 1 TABLET(5 MG) BY MOUTH EVERY EVENING   [DISCONTINUED] clopidogrel (PLAVIX) 75 MG tablet Take 1 tablet (75 mg total) by mouth daily.   [DISCONTINUED] Evolocumab (REPATHA SURECLICK) 140 MG/ML SOAJ ADMINISTER 1 ML UNDER THE SKIN EVERY 14 DAYS   [DISCONTINUED] rosuvastatin (CRESTOR) 40 MG tablet Take 1 tablet (40 mg total) by mouth daily.    Allergies:   Wellbutrin [bupropion]   Social History   Socioeconomic History   Marital status: Legally Separated    Spouse name: Not on file   Number of children: Not on file   Years of education: Not on file   Highest education level: Not on file  Occupational History   Not on file  Tobacco Use   Smoking status: Every Day     Current packs/day: 0.50    Average packs/day: 0.5 packs/day for 55.0 years (27.5 ttl pk-yrs)    Types: Cigarettes   Smokeless tobacco: Never   Tobacco comments:    2 cigarettes a day working toward cessation  Vaping Use   Vaping status: Never Used  Substance and Sexual Activity   Alcohol use: Yes    Alcohol/week: 2.0 standard drinks of alcohol    Types: 2 Cans of beer per week   Drug use: Never   Sexual activity: Not on file  Other Topics Concern   Not on file  Social History Narrative   Not on file   Social Determinants of Health   Financial Resource Strain: Patient Declined (11/08/2022)   Received from [facility]   Overall Financial Resource Strain (CARDIA)    Difficulty of Paying Living Expenses: Patient declined  Food Insecurity: Patient Declined (11/08/2022)   Received from [facility]   Hunger Vital Sign  Worried About Programme researcher, broadcasting/film/video in the Last Year: Patient declined    Barista in the Last Year: Patient declined  Transportation Needs: Patient Declined (11/08/2022)   Received from [facility]   PRAPARE - Transportation    In the past 12 months, has lack of transportation kept you from medical appointments or from getting medications?: Patient declined    Lack of Transportation (Non-Medical): Patient declined  Physical Activity: Unknown (02/24/2017)   Received from [facility], [facility]   Exercise Vital Sign    Days of Exercise per Week: Patient declined    Minutes of Exercise per Session: Patient declined  Stress: Unknown (02/24/2017)   Received from [facility], [facility]   [employer] of Occupational Health - Occupational Stress Questionnaire    Feeling of Stress : Patient declined  Social Connections: Unknown (02/24/2017)   Received from [facility], [facility]   Social  Connection and Isolation Panel [NHANES]    Frequency of Communication with Friends and Family: Patient declined    Frequency of Social Gatherings with Friends and Family: Patient declined    Attends Religious Services: Patient declined    Database administrator or Organizations: Patient declined    Attends Engineer, structural: Patient declined    Marital Status: Patient declined     Family History:  The patient's family history includes Cancer in her brother; Gout in her brother; Heart disease in her father and mother; Hemochromatosis in her brother; Hypertension in her brother; Stroke in her sister.  ROS:   12-point review of systems is negative unless otherwise noted in the HPI.   EKGs/Labs/Other Studies Reviewed:    Studies reviewed were summarized above. The additional studies were reviewed today:  2D echo 07/09/2022: 1. Left ventricular ejection fraction, by estimation, is 55 to 60%. Left  ventricular ejection fraction by 2D MOD biplane is 66.1 %. The left  ventricle has normal function. The left ventricle has no regional wall  motion abnormalities. There is mild left  ventricular hypertrophy. Left ventricular diastolic parameters are  consistent with Grade I diastolic dysfunction (impaired relaxation).   2. Right ventricular systolic function is normal. The right ventricular  size is normal.   3. The mitral valve is normal in structure. No evidence of mitral valve  regurgitation.   4. The aortic valve is tricuspid. Aortic valve regurgitation is not  visualized. Aortic valve sclerosis is present, with no evidence of aortic  valve stenosis.   5. The inferior vena cava is normal in size with greater than 50%  respiratory variability, suggesting right atrial pressure of 3 mmHg.  __________   LHC 07/11/2022:   Prox LAD lesion is 30% stenosed.   Mid LAD lesion is 50% stenosed.   Mid Cx to Dist Cx lesion is 95% stenosed.   Prox Cx to Mid Cx lesion is 85% stenosed.    Prox RCA lesion is 30% stenosed.   Mid RCA lesion is 30% stenosed.   Dist RCA lesion is 60% stenosed.   RPDA lesion is 70% stenosed.   2nd Diag lesion is 80% stenosed.   LPAV lesion is 100% stenosed.   Mid Cx lesion is 30% stenosed.   A drug-eluting stent was successfully placed using a STENT ONYX FRONTIER 2.25X15.   A drug-eluting stent was successfully placed using a STENT ONYX FRONTIER 4.0X12.   Post intervention, there is a  0% residual stenosis.   Post intervention, there is a 0% residual stenosis.   The left ventricular systolic function is normal.   LV end diastolic pressure is normal.   The left ventricular ejection fraction is 55-65% by visual estimate.   1.  Non-ST elevation myocardial infarction.  The culprit seems to be the left circumflex with an occluded small posterior AV groove branch with collaterals, significant distal and proximal stenosis and an aneurysmal vessel especially in the midsegment.  The LAD has moderate disease that was not significant by fractional flow reserve evaluation.  The second diagonal has significant stenosis but small in size.  The right PDA also has significant mid stenosis but small in size. 2.  Normal LV systolic function and normal left ventricular end-diastolic pressure. 3.  Successful angioplasty and drug-eluting stent placement to the distal and proximal left circumflex.   Recommendations: Dual antiplatelet therapy for 12 months. Aggressive treatment of risk factors. Treat small vessel disease medically.   EKG:  EKG is not ordered today.    Recent Labs: 07/12/2022: BUN 15; Creatinine, Ser 0.73; Hemoglobin 13.5; Platelets 250; Potassium 3.7; Sodium 136  Recent Lipid Panel    Component Value Date/Time   CHOL 212 (H) 07/09/2022 0408   TRIG 83 07/09/2022 0408   HDL 67 07/09/2022 0408   CHOLHDL 3.2 07/09/2022 0408   VLDL 17 07/09/2022 0408   LDLCALC 128 (H) 07/09/2022 0408    PHYSICAL EXAM:    VS:  BP 128/74 (BP Location: Left Arm,  Patient Position: Sitting, Cuff Size: Normal)   Pulse 72   Ht 5\' 4"  (1.626 m)   Wt 109 lb 12.8 oz (49.8 kg)   SpO2 99%   BMI 18.85 kg/m   BMI: Body mass index is 18.85 kg/m.  Physical Exam Vitals reviewed.  Constitutional:      Appearance: She is well-developed.  HENT:     Head: Normocephalic and atraumatic.  Eyes:     General:        Right eye: No discharge.        Left eye: No discharge.  Neck:     Vascular: No JVD.  Cardiovascular:     Rate and Rhythm: Normal rate and regular rhythm.     Heart sounds: Normal heart sounds, S1 normal and S2 normal. Heart sounds not distant. No midsystolic click and no opening snap. No murmur heard.    No friction rub.  Pulmonary:     Effort: Pulmonary effort is normal. No respiratory distress.     Breath sounds: Normal breath sounds. No decreased breath sounds, wheezing or rales.  Chest:     Chest wall: No tenderness.  Abdominal:     General: There is no distension.  Musculoskeletal:     Cervical back: Normal range of motion.  Skin:    General: Skin is warm and dry.     Nails: There is no clubbing.  Neurological:     Mental Status: She is alert and oriented to person, place, and time.  Psychiatric:        Speech: Speech normal.        Behavior: Behavior normal.        Thought Content: Thought content normal.        Judgment: Judgment normal.     Wt Readings from Last 3 Encounters:  11/09/22 109 lb (49.4 kg)  11/09/22 109 lb 12.8 oz (49.8 kg)  08/29/22 109 lb 8 oz (49.7 kg)     ASSESSMENT & PLAN:  CAD involving the native coronary arteries with recent NSTEMI without angina: She is doing well and without symptoms concerning for angina or cardiac decompensation.  Continue DAPT with aspirin and clopidogrel for a minimum of 12 months without interruption from date of PCI (07/11/2022).  Continue aggressive risk factor modification and secondary prevention including amlodipine, carvedilol and, and Repatha.  Participating with cardiac  rehab.  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  Followed by [facility] vascular surgery.  She remains on aspirin and PCSK9 inhibitor.  HLD: LDL 94 with goal being less than 55.  Currently on Repatha.  Query if she needs more time on PCSK9 inhibitor given LDL during admission in 07/2022 was only 128 and on Repatha this has only decreased to 294.  Recheck fasting lipid panel at next visit.  HTN: Blood pressure is well-controlled in the office today.  She remains on amlodipine and carvedilol.   Disposition: F/u with Dr. Azucena Cecil or an APP in 4 months.   Medication Adjustments/Labs and Tests Ordered: Current medicines are reviewed at length with the patient today.  Concerns regarding medicines are outlined above. Medication changes, Labs and Tests ordered today are summarized above and listed in the Patient Instructions accessible in Encounters.   Signed, Eula Listen, PA-C 11/09/2022 5:45 PM     [facility] [address] [place], [place] [address] [phone]

## 2022-11-09 ENCOUNTER — Encounter: Payer: Medicare Other | Admitting: *Deleted

## 2022-11-09 ENCOUNTER — Encounter: Payer: Self-pay | Admitting: Physician Assistant

## 2022-11-09 ENCOUNTER — Ambulatory Visit: Payer: Medicare Other | Attending: Physician Assistant | Admitting: Physician Assistant

## 2022-11-09 VITALS — Ht 64.02 in | Wt 109.0 lb

## 2022-11-09 VITALS — BP 128/74 | HR 72 | Ht 64.0 in | Wt 109.8 lb

## 2022-11-09 DIAGNOSIS — I10 Essential (primary) hypertension: Secondary | ICD-10-CM | POA: Insufficient documentation

## 2022-11-09 DIAGNOSIS — I7143 Infrarenal abdominal aortic aneurysm, without rupture: Secondary | ICD-10-CM | POA: Diagnosis present

## 2022-11-09 DIAGNOSIS — Z955 Presence of coronary angioplasty implant and graft: Secondary | ICD-10-CM | POA: Diagnosis not present

## 2022-11-09 DIAGNOSIS — I214 Non-ST elevation (NSTEMI) myocardial infarction: Secondary | ICD-10-CM | POA: Diagnosis present

## 2022-11-09 DIAGNOSIS — I2510 Atherosclerotic heart disease of native coronary artery without angina pectoris: Secondary | ICD-10-CM | POA: Diagnosis present

## 2022-11-09 DIAGNOSIS — I700 Atherosclerosis of aorta: Secondary | ICD-10-CM | POA: Diagnosis present

## 2022-11-09 DIAGNOSIS — E785 Hyperlipidemia, unspecified: Secondary | ICD-10-CM | POA: Diagnosis present

## 2022-11-09 DIAGNOSIS — I639 Cerebral infarction, unspecified: Secondary | ICD-10-CM

## 2022-11-09 DIAGNOSIS — I4892 Unspecified atrial flutter: Secondary | ICD-10-CM

## 2022-11-09 DIAGNOSIS — I712 Thoracic aortic aneurysm, without rupture, unspecified: Secondary | ICD-10-CM | POA: Diagnosis present

## 2022-11-09 DIAGNOSIS — I719 Aortic aneurysm of unspecified site, without rupture: Secondary | ICD-10-CM | POA: Diagnosis present

## 2022-11-09 DIAGNOSIS — I739 Peripheral vascular disease, unspecified: Secondary | ICD-10-CM | POA: Insufficient documentation

## 2022-11-09 MED ORDER — REPATHA SURECLICK 140 MG/ML ~~LOC~~ SOAJ: SUBCUTANEOUS | 3 refills | Status: DC

## 2022-11-09 MED ORDER — AMLODIPINE BESYLATE 5 MG PO TABS: 5.0000 mg | ORAL_TABLET | Freq: Every day | ORAL | 3 refills | Status: DC

## 2022-11-09 MED ORDER — CLOPIDOGREL BISULFATE 75 MG PO TABS: 75.0000 mg | ORAL_TABLET | Freq: Every day | ORAL | 3 refills | Status: DC

## 2022-11-09 MED ORDER — CARVEDILOL 3.125 MG PO TABS: 3.1250 mg | ORAL_TABLET | Freq: Two times a day (BID) | ORAL | 3 refills | Status: DC

## 2022-11-09 NOTE — Progress Notes (Signed)
Daily Session Note  Patient Details  Name: Dannica Bodin MRN: [id number] Date of Birth: [birth year] Referring Provider:   Flowsheet Row Cardiac Rehab from 08/29/2022 in [facility] Cardiac and Pulmonary Rehab  Referring Provider Arida       Encounter Date: 11/09/2022  Check In:  Session Check In - 11/09/22 1617       Check-In   Supervising physician immediately available to respond to emergencies See telemetry face sheet for immediately available ER MD    Location [facility]-Cardiac & Pulmonary Rehab    Staff Present Susann Givens, RN Atilano Median, RN, Bobetta Lime, RCP,RRT,BSRT    Virtual Visit No    Medication changes reported     No    Fall or balance concerns reported    No    Tobacco Cessation No Change    Current number of cigarettes/nicotine per day     5    Warm-up and Cool-down Performed on first and last piece of equipment    Resistance Training Performed Yes    VAD Patient? No    PAD/SET Patient? No      Pain Assessment   Currently in Pain? No/denies                Social History   Tobacco Use  Smoking Status Every Day   Current packs/day: 0.50   Average packs/day: 0.5 packs/day for 55.0 years (27.5 ttl pk-yrs)   Types: Cigarettes  Smokeless Tobacco Never  Tobacco Comments   2 cigarettes a day working toward cessation    Goals Met:  Independence with exercise equipment Exercise tolerated well No report of concerns or symptoms today Strength training completed today  Goals Unmet:  Not Applicable  Comments:   6 Minute Walk     Row Name 08/29/22 1723 11/09/22 1619       6 Minute Walk   Phase Initial Discharge    Distance 1000 feet 1110 feet    Distance % Change -- 11 %    Distance Feet Change -- 110 ft    Walk Time 6 minutes 6 minutes    # of Rest Breaks 0 0    MPH 1.89 2.1    METS 2.33 2.46    RPE 12 13    Perceived Dyspnea  1 1    VO2 Peak 8.16 8.61    Symptoms Yes (comment) Yes (comment)    Comments legs burning feeling during walk  test PAD, both legs pain    Resting HR 68 bpm 67 bpm    Resting BP 122/70 128/60    Resting Oxygen Saturation  99 % 96 %    Exercise Oxygen Saturation  during 6 min walk 97 % 96 %    Max Ex. HR 77 bpm 72 bpm    Max Ex. BP 128/70 124/64    2 Minute Post BP 110/60 110/62            Pt able to follow exercise prescription today without complaint.  Will continue to monitor for progression.    Dr. Bethann Punches is Medical Director for [facility] Cardiac Rehabilitation.  Dr. Vida Rigger is Medical Director for [facility] Pulmonary Rehabilitation.

## 2022-11-09 NOTE — Patient Instructions (Signed)
Medication Instructions:  Stop taking Crestor.  *If you need a refill on your cardiac medications before your next appointment, please call your pharmacy*   Lab Work: None ordered today.  If you have labs (blood work) drawn today and your tests are completely normal, you will receive your results only by: MyChart Message (if you have MyChart) OR A paper copy in the mail If you have any lab test that is abnormal or we need to change your treatment, we will call you to review the results.   Testing/Procedures: None ordered today.    Follow-Up: At [facility], you and your health needs are our priority.  As part of our continuing mission to provide you with exceptional heart care, we have created designated Provider Care Teams.  These Care Teams include your primary Cardiologist (physician) and Advanced Practice Providers (APPs -  Physician Assistants and Nurse Practitioners) who all work together to provide you with the care you need, when you need it.  We recommend signing up for the patient portal called "MyChart".  Sign up information is provided on this After Visit Summary.  MyChart is used to connect with patients for Virtual Visits (Telemedicine).  Patients are able to view lab/test results, encounter notes, upcoming appointments, etc.  Non-urgent messages can be sent to your provider as well.   To learn more about what you can do with MyChart, go to [email or web address].    Your next appointment:   4 month(s)  Provider:   You may see Debbe Odea, MD or one of the following Advanced Practice Providers on your designated Care Team:   Nicolasa Ducking, NP Eula Listen, PA-C Cadence Fransico Michael, PA-C Charlsie Quest, NP

## 2022-11-09 NOTE — Patient Instructions (Signed)
Discharge Patient Instructions  Patient Details  Name: Tiffany Munoz MRN: [id number] Date of Birth: 14-Apr-1945 Referring Provider:  Dorothey Baseman, MD   Number of Visits: 41  Reason for Discharge:  Patient reached a stable level of exercise. Patient independent in their exercise. Patient has met program and personal goals.  Smoking History:  Social History   Tobacco Use  Smoking Status Every Day   Current packs/day: 0.50   Average packs/day: 0.5 packs/day for 55.0 years (27.5 ttl pk-yrs)   Types: Cigarettes  Smokeless Tobacco Never  Tobacco Comments   2 cigarettes a day working toward cessation    Diagnosis:  Status post coronary artery stent placement  NSTEMI (non-ST elevation myocardial infarction) [facility])  Initial Exercise Prescription:  Initial Exercise Prescription - 08/29/22 1700       Date of Initial Exercise RX and Referring Provider   Date 08/29/22    Referring Provider Arida      Oxygen   Maintain Oxygen Saturation 88% or higher      Treadmill   MPH 1.5    Grade 0.5    Minutes 15    METs 2.33      NuStep   Level 2    SPM 80    Minutes 15    METs 2.33      Recumbant Elliptical   Level 1    RPM 50    Minutes 15    METs 2.33      Biostep-RELP   Level 1    SPM 50    Minutes 15    METs 2.33      Track   Laps 25    Minutes 15    METs 2.36      Prescription Details   Frequency (times per week) 3    Duration Progress to 30 minutes of continuous aerobic without signs/symptoms of physical distress      Intensity   THRR 40-80% of Max Heartrate 98-129    Ratings of Perceived Exertion 11-13    Perceived Dyspnea 0-4      Progression   Progression Continue to progress workloads to maintain intensity without signs/symptoms of physical distress.      Resistance Training   Training Prescription Yes    Weight 2    Reps 10-15            Discharge Exercise Prescription (Final Exercise Prescription Changes):  Exercise Prescription  Changes - 10/26/22 1400       Response to Exercise   Blood Pressure (Admit) 120/66    Blood Pressure (Exit) 116/64    Heart Rate (Admit) 84 bpm    Heart Rate (Exercise) 91 bpm    Heart Rate (Exit) 71 bpm    Rating of Perceived Exertion (Exercise) 13    Symptoms none    Duration Continue with 30 min of aerobic exercise without signs/symptoms of physical distress.    Intensity THRR unchanged      Progression   Progression Continue to progress workloads to maintain intensity without signs/symptoms of physical distress.    Average METs 2.74      Resistance Training   Training Prescription Yes    Weight 5 lb    Reps 10-15      Interval Training   Interval Training No      Treadmill   MPH 2    Grade 2    Minutes 15    METs 3.08      Recumbant Bike   Level 1.8  Watts 25    Minutes 15    METs 3.55      REL-XR   Level 1    Minutes 15    METs 2.7      T5 Nustep   Level 1    Minutes 15    METs 1.8      Biostep-RELP   Level 2    Minutes 15    METs 2      Oxygen   Maintain Oxygen Saturation 88% or higher            Functional Capacity:  6 Minute Walk     Row Name 08/29/22 1723 11/09/22 1619       6 Minute Walk   Phase Initial Discharge    Distance 1000 feet 1110 feet    Distance % Change -- 11 %    Distance Feet Change -- 110 ft    Walk Time 6 minutes 6 minutes    # of Rest Breaks 0 0    MPH 1.89 2.1    METS 2.33 2.46    RPE 12 13    Perceived Dyspnea  1 1    VO2 Peak 8.16 8.61    Symptoms Yes (comment) Yes (comment)    Comments legs burning feeling during walk test PAD, both legs pain    Resting HR 68 bpm 67 bpm    Resting BP 122/70 128/60    Resting Oxygen Saturation  99 % 96 %    Exercise Oxygen Saturation  during 6 min walk 97 % 96 %    Max Ex. HR 77 bpm 72 bpm    Max Ex. BP 128/70 124/64    2 Minute Post BP 110/60 110/62              Post Biometrics - 11/09/22 1621        Post  Biometrics   Height 5' 4.02" (1.626 m)     Weight 109 lb (49.4 kg)    BMI (Calculated) 18.7    Single Leg Stand 18 seconds            Goals reviewed with patient; copy given to patient.

## 2022-11-10 ENCOUNTER — Encounter: Payer: Medicare Other | Attending: Cardiovascular Disease | Admitting: *Deleted

## 2022-11-10 DIAGNOSIS — Z955 Presence of coronary angioplasty implant and graft: Secondary | ICD-10-CM | POA: Diagnosis present

## 2022-11-10 DIAGNOSIS — I214 Non-ST elevation (NSTEMI) myocardial infarction: Secondary | ICD-10-CM | POA: Diagnosis present

## 2022-11-10 NOTE — Progress Notes (Signed)
Daily Session Note  Patient Details  Name: Tiffany Munoz MRN: [id number] Date of Birth: Oct 22, 1945 Referring Provider:   Flowsheet Row Cardiac Rehab from 08/29/2022 in [facility] Cardiac and Pulmonary Rehab  Referring Provider Kirke Corin       Encounter Date: 11/10/2022  Check In:  Session Check In - 11/10/22 1600       Check-In   Supervising physician immediately available to respond to emergencies See telemetry face sheet for immediately available ER MD    Location [facility]-Cardiac & Pulmonary Rehab    Staff Present Susann Givens, RN BSN;Joseph Bancroft, RCP,RRT,BSRT;Other   Seward Grater Best ad Jetta Lout   Virtual Visit No    Medication changes reported     No    Fall or balance concerns reported    No    Tobacco Cessation No Change    Current number of cigarettes/nicotine per day     5    Warm-up and Cool-down Performed on first and last piece of equipment    Resistance Training Performed Yes    VAD Patient? No    PAD/SET Patient? No      Pain Assessment   Currently in Pain? No/denies                Social History   Tobacco Use  Smoking Status Every Day   Current packs/day: 0.50   Average packs/day: 0.5 packs/day for 55.0 years (27.5 ttl pk-yrs)   Types: Cigarettes  Smokeless Tobacco Never  Tobacco Comments   2 cigarettes a day working toward cessation    Goals Met:  Independence with exercise equipment Exercise tolerated well No report of concerns or symptoms today Strength training completed today  Goals Unmet:  Not Applicable  Comments: Pt able to follow exercise prescription today without complaint.  Will continue to monitor for progression.    Dr. Bethann Punches is Medical Director for [facility] Cardiac Rehabilitation.  Dr. Vida Rigger is Medical Director for [facility] Pulmonary Rehabilitation.

## 2022-11-14 ENCOUNTER — Encounter: Payer: Medicare Other | Admitting: *Deleted

## 2022-11-14 DIAGNOSIS — Z955 Presence of coronary angioplasty implant and graft: Secondary | ICD-10-CM | POA: Diagnosis not present

## 2022-11-14 DIAGNOSIS — I214 Non-ST elevation (NSTEMI) myocardial infarction: Secondary | ICD-10-CM

## 2022-11-14 NOTE — Progress Notes (Signed)
Daily Session Note  Patient Details  Name: Tiffany Munoz MRN: [id number] Date of Birth: [birth year] Referring Provider:   Flowsheet Row Cardiac Rehab from 08/29/2022 in [facility] Cardiac and Pulmonary Rehab  Referring Provider Kirke Corin       Encounter Date: 11/14/2022  Check In:  Session Check In - 11/14/22 1616       Check-In   Supervising physician immediately available to respond to emergencies See telemetry face sheet for immediately available ER MD    Location [facility]-Cardiac & Pulmonary Rehab    Staff Present Susann Givens, RN BSN;Joseph Reino Kent, RCP,RRT,BSRT;Other   Max Conetta BS   Virtual Visit No    Medication changes reported     Yes    Comments added alendronate sodium 1 x week, carvedilol 3.125mg  BID, Vit D3-1000mg     Fall or balance concerns reported    No    Tobacco Cessation Use Increase    Current number of cigarettes/nicotine per day     8    Warm-up and Cool-down Performed on first and last piece of equipment    Resistance Training Performed Yes    VAD Patient? No    PAD/SET Patient? No      Pain Assessment   Currently in Pain? No/denies                Social History   Tobacco Use  Smoking Status Every Day   Current packs/day: 0.50   Average packs/day: 0.5 packs/day for 55.0 years (27.5 ttl pk-yrs)   Types: Cigarettes  Smokeless Tobacco Never  Tobacco Comments   2 cigarettes a day working toward cessation    Goals Met:  Independence with exercise equipment Exercise tolerated well No report of concerns or symptoms today Strength training completed today  Goals Unmet:  Not Applicable  Comments: Pt able to follow exercise prescription today without complaint.  Will continue to monitor for progression. \   Dr. Bethann Punches is Medical Director for [facility] Cardiac Rehabilitation.  Dr. Vida Rigger is Medical Director for [facility] Pulmonary Rehabilitation.

## 2022-11-14 NOTE — Progress Notes (Signed)
Assessment start time: 2:50 PM  Accessibility to food: none Digestive issues/concerns: no known food allergies, no digestive issue stated  Intake Patterns doesn't fry food anymore, no bacon anymore, switched to [place] sausage. Only cooks 2-3 days per week. Eats out or leftover, or snack around. Most of the time Usually 2 meals then snacks. Pt eats away from home 4 times a week. patient does grocery shopping. patient does cooking.   Education r/t nutrition plan  Patient drinking water mostly. She doesn't have the biggest appetite, usually gets 2 meals in per day. Will then snack. She lost weight during her hospital stay and has been working on gaining. Educated on calorie density, encouraged more healthy fats, full fat dairy (in small portions) if needed. Set goal to have small but more frequent meals, not to miss meals, have a impactful snack. Brainstormed some examples like apple with peanut butter, cheese, crackers, handful of nuts, ect. Reviewed the mediterranean diet handout, reminded her to focus on small frequent meal with healthy fats rather than filling up on low calorie foods like veggies and soups.   Goal 1: Eat small frequent meals Goal 2: Pair a carb with a protein/fat Goal 3: Prioritize healthy fats to meet calorie needs  End time 3:43 PM

## 2022-11-16 ENCOUNTER — Encounter: Payer: Self-pay | Admitting: *Deleted

## 2022-11-16 DIAGNOSIS — Z955 Presence of coronary angioplasty implant and graft: Secondary | ICD-10-CM

## 2022-11-16 DIAGNOSIS — I214 Non-ST elevation (NSTEMI) myocardial infarction: Secondary | ICD-10-CM

## 2022-11-16 NOTE — Progress Notes (Signed)
Cardiac Individual Treatment Plan  Patient Details  Name: Keara Bebout MRN: [id number] Date of Birth: 1946/03/20 Referring Provider:   Flowsheet Row Cardiac Rehab from 08/29/2022 in [facility] Cardiac and Pulmonary Rehab  Referring Provider Arida       Initial Encounter Date:  Flowsheet Row Cardiac Rehab from 08/29/2022 in [facility] Cardiac and Pulmonary Rehab  Date 08/29/22       Visit Diagnosis: Status post coronary artery stent placement  NSTEMI (non-ST elevation myocardial infarction) [facility])  Patient's Home Medications on Admission:  Current Outpatient Medications:    amLODipine (NORVASC) 5 MG tablet, Take 1 tablet (5 mg total) by mouth daily., Disp: 90 tablet, Rfl: 3   aspirin 81 MG tablet, Take 1-2 tablets by mouth daily., Disp: , Rfl:    carvedilol (COREG) 3.125 MG tablet, Take 1 tablet (3.125 mg total) by mouth 2 (two) times daily with a meal., Disp: 180 tablet, Rfl: 3   cetirizine (ZYRTEC ALLERGY) 10 MG tablet, Take 1 tablet by mouth daily as needed for rhinitis or allergies., Disp: , Rfl:    clobetasol cream (TEMOVATE) 0.05 %, Apply 1 Application topically 3 (three) times daily as needed., Disp: , Rfl:    clopidogrel (PLAVIX) 75 MG tablet, Take 1 tablet (75 mg total) by mouth daily., Disp: 90 tablet, Rfl: 3   Evolocumab (REPATHA SURECLICK) 140 MG/ML SOAJ, ADMINISTER 1 ML UNDER THE SKIN EVERY 14 DAYS, Disp: 6 mL, Rfl: 3   nitroGLYCERIN (NITROSTAT) 0.4 MG SL tablet, Place 1 tablet (0.4 mg total) under the tongue every 5 (five) minutes x 3 doses as needed for chest pain., Disp: 30 tablet, Rfl: 2   potassium chloride (K-DUR,KLOR-CON) 10 MEQ tablet, Take 1 tablet by mouth daily., Disp: , Rfl:    sodium fluoride (PREVIDENT) 1.1 % GEL dental gel, Take 1 application by mouth daily. (Patient not taking: Reported on 08/02/2022), Disp: , Rfl:    traMADol (ULTRAM) 50 MG tablet, Take 50 mg by mouth every 8 (eight) hours as needed for severe pain or moderate pain. (Patient not taking: Reported on  11/09/2022), Disp: , Rfl:   Past Medical History: Past Medical History:  Diagnosis Date   AAA (abdominal aortic aneurysm) (HCC)    Hypertension    Migraine     Tobacco Use: Social History   Tobacco Use  Smoking Status Every Day   Current packs/day: 0.50   Average packs/day: 0.5 packs/day for 55.0 years (27.5 ttl pk-yrs)   Types: Cigarettes  Smokeless Tobacco Never  Tobacco Comments   2 cigarettes a day working toward cessation    Labs: Review Flowsheet       Latest Ref Rng & Units 03/29/2018 07/09/2022  Labs for ITP Cardiac and Pulmonary Rehab  Cholestrol 0 - 200 mg/dL - 865   LDL (calc) 0 - 99 mg/dL - 784   HDL-C >69 mg/dL - 67   Trlycerides <629 mg/dL - 83   Hemoglobin B2W 4.8 - 5.6 % - 5.1   TCO2 22 - 32 mmol/L 31  -    Details             Exercise Target Goals: Exercise Program Goal: Individual exercise prescription set using results from initial 6 min walk test and THRR while considering  patient's activity barriers and safety.   Exercise Prescription Goal: Initial exercise prescription builds to 30-45 minutes a day of aerobic activity, 2-3 days per week.  Home exercise guidelines will be given to patient during program as part of exercise prescription that the  participant will acknowledge.   Education: Aerobic Exercise: - Group verbal and visual presentation on the components of exercise prescription. Introduces F.I.T.T principle from ACSM for exercise prescriptions.  Reviews F.I.T.T. principles of aerobic exercise including progression. Written material given at graduation. Flowsheet Row Cardiac Rehab from 10/19/2022 in [facility] Cardiac and Pulmonary Rehab  Education need identified 08/29/22  Date 09/14/22  Educator [facility]  Instruction Review Code 1- [employer] Understanding       Education: Resistance Exercise: - Group verbal and visual presentation on the components of exercise prescription. Introduces F.I.T.T principle from ACSM for exercise  prescriptions  Reviews F.I.T.T. principles of resistance exercise including progression. Written material given at graduation.    Education: Exercise & Equipment Safety: - Individual verbal instruction and demonstration of equipment use and safety with use of the equipment. Flowsheet Row Cardiac Rehab from 10/19/2022 in [facility] Cardiac and Pulmonary Rehab  Date 08/29/22  Educator [facility]  Instruction Review Code 1- Verbalizes Understanding       Education: Exercise Physiology & General Exercise Guidelines: - Group verbal and written instruction with models to review the exercise physiology of the cardiovascular system and associated critical values. Provides general exercise guidelines with specific guidelines to those with heart or lung disease.  Flowsheet Row Cardiac Rehab from 10/19/2022 in [facility] Cardiac and Pulmonary Rehab  Education need identified 08/29/22       Education: Flexibility, Balance, Mind/Body Relaxation: - Group verbal and visual presentation with interactive activity on the components of exercise prescription. Introduces F.I.T.T principle from ACSM for exercise prescriptions. Reviews F.I.T.T. principles of flexibility and balance exercise training including progression. Also discusses the mind body connection.  Reviews various relaxation techniques to help reduce and manage stress (i.e. Deep breathing, progressive muscle relaxation, and visualization). Balance handout provided to take home. Written material given at graduation. Flowsheet Row Cardiac Rehab from 10/19/2022 in [facility] Cardiac and Pulmonary Rehab  Date 09/28/22  Educator [facility]  Instruction Review Code 1- Verbalizes Understanding       Activity Barriers & Risk Stratification:  Activity Barriers & Cardiac Risk Stratification - 08/29/22 1724       Activity Barriers & Cardiac Risk Stratification   Activity Barriers None    Cardiac Risk Stratification High             6 Minute Walk:  6 Minute Walk     Row Name  08/29/22 1723 11/09/22 1619       6 Minute Walk   Phase Initial Discharge    Distance 1000 feet 1110 feet    Distance % Change -- 11 %    Distance Feet Change -- 110 ft    Walk Time 6 minutes 6 minutes    # of Rest Breaks 0 0    MPH 1.89 2.1    METS 2.33 2.46    RPE 12 13    Perceived Dyspnea  1 1    VO2 Peak 8.16 8.61    Symptoms Yes (comment) Yes (comment)    Comments legs burning feeling during walk test PAD, both legs pain    Resting HR 68 bpm 67 bpm    Resting BP 122/70 128/60    Resting Oxygen Saturation  99 % 96 %    Exercise Oxygen Saturation  during 6 min walk 97 % 96 %    Max Ex. HR 77 bpm 72 bpm    Max Ex. BP 128/70 124/64    2 Minute Post BP 110/60 110/62  Oxygen Initial Assessment:   Oxygen Re-Evaluation:   Oxygen Discharge (Final Oxygen Re-Evaluation):   Initial Exercise Prescription:  Initial Exercise Prescription - 08/29/22 1700       Date of Initial Exercise RX and Referring Provider   Date 08/29/22    Referring Provider Arida      Oxygen   Maintain Oxygen Saturation 88% or higher      Treadmill   MPH 1.5    Grade 0.5    Minutes 15    METs 2.33      NuStep   Level 2    SPM 80    Minutes 15    METs 2.33      Recumbant Elliptical   Level 1    RPM 50    Minutes 15    METs 2.33      Biostep-RELP   Level 1    SPM 50    Minutes 15    METs 2.33      Track   Laps 25    Minutes 15    METs 2.36      Prescription Details   Frequency (times per week) 3    Duration Progress to 30 minutes of continuous aerobic without signs/symptoms of physical distress      Intensity   THRR 40-80% of Max Heartrate 98-129    Ratings of Perceived Exertion 11-13    Perceived Dyspnea 0-4      Progression   Progression Continue to progress workloads to maintain intensity without signs/symptoms of physical distress.      Resistance Training   Training Prescription Yes    Weight 2    Reps 10-15             Perform Capillary  Blood Glucose checks as needed.  Exercise Prescription Changes:   Exercise Prescription Changes     Row Name 08/29/22 1700 09/13/22 0800 09/28/22 0700 10/12/22 1500 10/26/22 1400     Response to Exercise   Blood Pressure (Admit) 122/70 120/76 122/64 102/70 120/66   Blood Pressure (Exercise) 128/70 134/70 140/62 140/68 --   Blood Pressure (Exit) 110/60 128/72 122/66 110/60 116/64   Heart Rate (Admit) 68 bpm 68 bpm 69 bpm 70 bpm 84 bpm   Heart Rate (Exercise) 77 bpm 85 bpm 95 bpm 96 bpm 91 bpm   Heart Rate (Exit) 71 bpm 69 bpm 77 bpm 74 bpm 71 bpm   Oxygen Saturation (Admit) 99 % -- -- -- --   Oxygen Saturation (Exercise) 97 % -- -- -- --   Oxygen Saturation (Exit) 95 % -- -- -- --   Rating of Perceived Exertion (Exercise) 12 13 13 13 13    Perceived Dyspnea (Exercise) 1 -- -- -- --   Symptoms legs burned a little none none none none   Comments 6 MWT results third full day of exercise -- -- --   Duration -- Progress to 30 minutes of  aerobic without signs/symptoms of physical distress Continue with 30 min of aerobic exercise without signs/symptoms of physical distress. Continue with 30 min of aerobic exercise without signs/symptoms of physical distress. Continue with 30 min of aerobic exercise without signs/symptoms of physical distress.   Intensity -- THRR unchanged THRR unchanged THRR unchanged THRR unchanged     Progression   Progression -- Continue to progress workloads to maintain intensity without signs/symptoms of physical distress. Continue to progress workloads to maintain intensity without signs/symptoms of physical distress. Continue to progress workloads to maintain intensity without signs/symptoms of  physical distress. Continue to progress workloads to maintain intensity without signs/symptoms of physical distress.   Average METs -- 2.51 2.14 2.33 2.74     Resistance Training   Training Prescription -- Yes Yes Yes Yes   Weight -- 2 lb 3 lb 3 lb 5 lb   Reps -- 10-15 10-15  10-15 10-15     Interval Training   Interval Training -- No No No No     Treadmill   MPH -- 1.6 2 1.9 2   Grade -- 0.5 2 2 2    Minutes -- 15 15 15 15    METs -- 2.32 3.08 2.98 3.08     Recumbant Bike   Level -- 1 -- 1 1.8   Watts -- -- -- 25 25   Minutes -- 15 -- 15 15   METs -- -- -- 1.4 3.55     NuStep   Level -- 2 2 2  --   Minutes -- 15 15 15  --   METs -- 2.7 1.3 1.6 --     REL-XR   Level -- -- 3 -- 1   Minutes -- -- 15 -- 15   METs -- -- 1.5 -- 2.7     T5 Nustep   Level -- -- 2 -- 1   Minutes -- -- 15 -- 15   METs -- -- 1.7 -- 1.8     Biostep-RELP   Level -- -- 1 2 2    Minutes -- -- 15 15 15    METs -- -- 2 2 2      Track   Laps -- -- 25 -- --   Minutes -- -- 15 -- --   METs -- -- 2.36 -- --     Oxygen   Maintain Oxygen Saturation -- 88% or higher 88% or higher 88% or higher 88% or higher    Row Name 11/10/22 1500             Response to Exercise   Blood Pressure (Admit) 130/70       Blood Pressure (Exit) 102/60       Heart Rate (Admit) 70 bpm       Heart Rate (Exercise) 85 bpm       Heart Rate (Exit) 72 bpm       Rating of Perceived Exertion (Exercise) 12       Symptoms none       Duration Continue with 30 min of aerobic exercise without signs/symptoms of physical distress.       Intensity THRR unchanged         Progression   Progression Continue to progress workloads to maintain intensity without signs/symptoms of physical distress.       Average METs 2.31         Resistance Training   Training Prescription Yes       Weight 5 lb       Reps 10-15         Interval Training   Interval Training No         Treadmill   MPH 1.9       Grade 1       Minutes 15       METs 2.72         REL-XR   Level 1       Minutes 15       METs 1.5         Track   Laps 24  Minutes 15       METs 2.31         Oxygen   Maintain Oxygen Saturation 88% or higher                Exercise Comments:   Exercise Comments     Row Name 09/07/22  1611           Exercise Comments First full day of exercise!  Patient was oriented to gym and equipment including functions, settings, policies, and procedures.  Patient's individual exercise prescription and treatment plan were reviewed.  All starting workloads were established based on the results of the 6 minute walk test done at initial orientation visit.  The plan for exercise progression was also introduced and progression will be customized based on patient's performance and goals.                Exercise Goals and Review:   Exercise Goals     Row Name 08/29/22 1727             Exercise Goals   Increase Physical Activity Yes       Intervention Provide advice, education, support and counseling about physical activity/exercise needs.;Develop an individualized exercise prescription for aerobic and resistive training based on initial evaluation findings, risk stratification, comorbidities and participant's personal goals.       Expected Outcomes Short Term: Attend rehab on a regular basis to increase amount of physical activity.;Long Term: Add in home exercise to make exercise part of routine and to increase amount of physical activity.;Long Term: Exercising regularly at least 3-5 days a week.       Increase Strength and Stamina Yes       Intervention Provide advice, education, support and counseling about physical activity/exercise needs.;Develop an individualized exercise prescription for aerobic and resistive training based on initial evaluation findings, risk stratification, comorbidities and participant's personal goals.       Expected Outcomes Short Term: Increase workloads from initial exercise prescription for resistance, speed, and METs.;Short Term: Perform resistance training exercises routinely during rehab and add in resistance training at home;Long Term: Improve cardiorespiratory fitness, muscular endurance and strength as measured by increased METs and functional  capacity ( )       Able to understand and use rate of perceived exertion (RPE) scale Yes       Intervention Provide education and explanation on how to use RPE scale       Expected Outcomes Short Term: Able to use RPE daily in rehab to express subjective intensity level;Long Term:  Able to use RPE to guide intensity level when exercising independently       Able to understand and use Dyspnea scale Yes       Intervention Provide education and explanation on how to use Dyspnea scale       Expected Outcomes Short Term: Able to use Dyspnea scale daily in rehab to express subjective sense of shortness of breath during exertion;Long Term: Able to use Dyspnea scale to guide intensity level when exercising independently       Knowledge and understanding of Target Heart Rate Range (THRR) Yes       Intervention Provide education and explanation of THRR including how the numbers were predicted and where they are located for reference       Expected Outcomes Short Term: Able to state/look up THRR;Long Term: Able to use THRR to govern intensity when exercising independently;Short Term: Able to use daily as guideline for intensity in  rehab       Able to check pulse independently Yes       Intervention Provide education and demonstration on how to check pulse in carotid and radial arteries.;Review the importance of being able to check your own pulse for safety during independent exercise       Expected Outcomes Short Term: Able to explain why pulse checking is important during independent exercise;Long Term: Able to check pulse independently and accurately       Understanding of Exercise Prescription Yes       Intervention Provide education, explanation, and written materials on patient's individual exercise prescription       Expected Outcomes Short Term: Able to explain program exercise prescription;Long Term: Able to explain home exercise prescription to exercise independently                Exercise  Goals Re-Evaluation :  Exercise Goals Re-Evaluation     Row Name 09/07/22 1613 09/13/22 0857 09/28/22 0757 10/12/22 1532 10/12/22 1533     Exercise Goal Re-Evaluation   Exercise Goals Review Increase Physical Activity;Able to understand and use rate of perceived exertion (RPE) scale;Knowledge and understanding of Target Heart Rate Range (THRR);Understanding of Exercise Prescription;Increase Strength and Stamina;Able to check pulse independently Increase Physical Activity;Increase Strength and Stamina;Understanding of Exercise Prescription Increase Physical Activity;Increase Strength and Stamina;Understanding of Exercise Prescription Increase Physical Activity;Increase Strength and Stamina;Understanding of Exercise Prescription --   Comments Reviewed RPE  and dyspnea scale, THR and program prescription with pt today.  Pt voiced understanding and was given a copy of goals to take home. Shahzoda is off to a good start in rehab.  She has completed her first three full days of rehab thus far.  She is alread up to 2.7 METs on the NuStep.  We will continue to monitor her progress. Khelani is doing well in the program. She increased her treadmill workload up to 2 mph with a 2% incline. She also improved to level 3 on the XR and level 2 on the T5 nustep. She increased to 3 lb hand weights for resistance training as well. Bianney continues to do well with hter exercise progression strength and stamina. Enna continues to do well with her exercise progression strength and stamina. She has increased the Biostep to level 2  and increased her treadmill from 1.7/2% to 1.9/2%and is maintaining all other workloads.  Will continue to monitor her progression.   Expected Outcomes Short: Use RPE daily to regulate intensity.  Long: Follow program prescription in THR. Short: Continue to to attend rehab regularly Long: Continue to follow program prescription Short: Continue to progressively increase treadmill workload. Long: Continue  to improve strength and stamina. -- STG  continue to increse workloads as tolerated. LTG continue exercie progression to help improve strength and stamina    Row Name 10/26/22 1409 11/10/22 1546           Exercise Goal Re-Evaluation   Exercise Goals Review Increase Physical Activity;Increase Strength and Stamina;Understanding of Exercise Prescription Increase Physical Activity;Increase Strength and Stamina;Understanding of Exercise Prescription      Comments Allen continues to do well in rehab. She increased her treadmill workload back up to her previous speed of 2 mph with an incline of 2%. She also improved to level 1.8 on the recumbent bike, and increased from 3 lb to 5 lb hand weights for resistance training. We will continue to monitor her progress in the program. Reizel continues to do well in rehab. She recently  completed her post and improved by 11%. She also continues to do well walking on both the track and the treadmill. She also continues to do well with 5 lb hand weights for resistance training. We will continue to monitor her progress in the program.      Expected Outcomes Short: Continue to progressively increase treadmill workload. Long: Continue to improve strength and stamina. Short: Graduate. Long: Continue to exercise independently.               Discharge Exercise Prescription (Final Exercise Prescription Changes):  Exercise Prescription Changes - 11/10/22 1500       Response to Exercise   Blood Pressure (Admit) 130/70    Blood Pressure (Exit) 102/60    Heart Rate (Admit) 70 bpm    Heart Rate (Exercise) 85 bpm    Heart Rate (Exit) 72 bpm    Rating of Perceived Exertion (Exercise) 12    Symptoms none    Duration Continue with 30 min of aerobic exercise without signs/symptoms of physical distress.    Intensity THRR unchanged      Progression   Progression Continue to progress workloads to maintain intensity without signs/symptoms of physical distress.     Average METs 2.31      Resistance Training   Training Prescription Yes    Weight 5 lb    Reps 10-15      Interval Training   Interval Training No      Treadmill   MPH 1.9    Grade 1    Minutes 15    METs 2.72      REL-XR   Level 1    Minutes 15    METs 1.5      Track   Laps 24    Minutes 15    METs 2.31      Oxygen   Maintain Oxygen Saturation 88% or higher             Nutrition:  Target Goals: Understanding of nutrition guidelines, daily intake of sodium 1500mg , cholesterol 200mg , calories 30% from fat and 7% or less from saturated fats, daily to have 5 or more servings of fruits and vegetables.  Education: All About Nutrition: -Group instruction provided by verbal, written material, interactive activities, discussions, models, and posters to present general guidelines for heart healthy nutrition including fat, fiber, MyPlate, the role of sodium in heart healthy nutrition, utilization of the nutrition label, and utilization of this knowledge for meal planning. Follow up email sent as well. Written material given at graduation. Flowsheet Row Cardiac Rehab from 10/19/2022 in [facility] Cardiac and Pulmonary Rehab  Education need identified 08/29/22       Biometrics:  Pre Biometrics - 08/29/22 1727       Pre Biometrics   Height 5\' 5"  (1.651 m)    Weight 109 lb 8 oz (49.7 kg)    Waist Circumference 26.5 inches    Hip Circumference 35 inches    Waist to Hip Ratio 0.76 %    BMI (Calculated) 18.22    Single Leg Stand 30 seconds             Post Biometrics - 11/09/22 1621        Post  Biometrics   Height 5' 4.02" (1.626 m)    Weight 109 lb (49.4 kg)    BMI (Calculated) 18.7    Single Leg Stand 18 seconds             Nutrition Therapy Plan  and Nutrition Goals:  Nutrition Therapy & Goals - 11/14/22 1713       Nutrition Therapy   Diet Cardiac, low Na    Protein (specify units) 90    Fiber 25 grams    Whole Grain Foods 3 servings    Saturated  Fats 15 max. grams    Fruits and Vegetables 5 servings/day    Sodium 2 grams      Personal Nutrition Goals   Nutrition Goal Eat small frequent meals    Personal Goal #2 Pair a carb with a protein/fat    Personal Goal #3 Prioritize healthy fats to meet calorie needs    Comments Patient drinking water mostly. She doesn't have the biggest appetite, usually gets 2 meals in per day. Will then snack. She lost weight during her hospital stay and has been working on gaining. Educated on calorie density, encouraged more healthy fats, full fat dairy (in small portions) if needed. Set goal to have small but more frequent meals, not to miss meals, have a impactful snack. Brainstormed some examples like apple with peanut butter, cheese, crackers, handful of nuts, ect. Reviewed the mediterranean diet handout, reminded her to focus on small frequent meal with healthy fats rather than filling up on low calorie foods like veggies and soups.      Intervention Plan   Intervention Prescribe, educate and counsel regarding individualized specific dietary modifications aiming towards targeted core components such as weight, hypertension, lipid management, diabetes, heart failure and other comorbidities.;Nutrition handout(s) given to patient.    Expected Outcomes Short Term Goal: Understand basic principles of dietary content, such as calories, fat, sodium, cholesterol and nutrients.;Short Term Goal: A plan has been developed with personal nutrition goals set during dietitian appointment.;Long Term Goal: Adherence to prescribed nutrition plan.             Nutrition Assessments:  MEDIFICTS Score Key: ?70 Need to make dietary changes  40-70 Heart Healthy Diet ? 40 Therapeutic Level Cholesterol Diet  Flowsheet Row Cardiac Rehab from 11/10/2022 in [facility] Cardiac and Pulmonary Rehab  Picture Your Plate Total Score on Discharge 62      Picture Your Plate Scores: <16 Unhealthy dietary pattern with much room for  improvement. 41-50 Dietary pattern unlikely to meet recommendations for good health and room for improvement. 51-60 More healthful dietary pattern, with some room for improvement.  >60 Healthy dietary pattern, although there may be some specific behaviors that could be improved.    Nutrition Goals Re-Evaluation:  Nutrition Goals Re-Evaluation     Row Name 10/27/22 1634             Goals   Nutrition Goal Meet with RD.       Comment Patient would like to meet with RD.       Expected Outcome Short: meet with RD. Long:adhere to a diet that pertains to her.                Nutrition Goals Discharge (Final Nutrition Goals Re-Evaluation):  Nutrition Goals Re-Evaluation - 10/27/22 1634       Goals   Nutrition Goal Meet with RD.    Comment Patient would like to meet with RD.    Expected Outcome Short: meet with RD. Long:adhere to a diet that pertains to her.             Psychosocial: Target Goals: Acknowledge presence or absence of significant depression and/or stress, maximize coping skills, provide positive support system. Participant is able to  verbalize types and ability to use techniques and skills needed for reducing stress and depression.   Education: Stress, Anxiety, and Depression - Group verbal and visual presentation to define topics covered.  Reviews how body is impacted by stress, anxiety, and depression.  Also discusses healthy ways to reduce stress and to treat/manage anxiety and depression.  Written material given at graduation.   Education: Sleep Hygiene -Provides group verbal and written instruction about how sleep can affect your health.  Define sleep hygiene, discuss sleep cycles and impact of sleep habits. Review good sleep hygiene tips.    Initial Review & Psychosocial Screening:  Initial Psych Review & Screening - 08/24/22 1039       Initial Review   Current issues with None Identified      Family Dynamics   Good Support System? Yes   Daughter,  good friend     Barriers   Psychosocial barriers to participate in program There are no identifiable barriers or psychosocial needs.      Screening Interventions   Interventions Encouraged to exercise;To provide support and resources with identified psychosocial needs;Provide feedback about the scores to participant    Expected Outcomes Short Term goal: Utilizing psychosocial counselor, staff and physician to assist with identification of specific Stressors or current issues interfering with healing process. Setting desired goal for each stressor or current issue identified.;Long Term Goal: Stressors or current issues are controlled or eliminated.;Short Term goal: Identification and review with participant of any Quality of Life or Depression concerns found by scoring the questionnaire.;Long Term goal: The participant improves quality of Life and PHQ9 Scores as seen by post scores and/or verbalization of changes             Quality of Life Scores:   Quality of Life - 11/10/22 1615       Quality of Life   Select Quality of Life      Quality of Life Scores   Health/Function Pre 22.23 %    Health/Function Post 22.57 %    Health/Function % Change 1.53 %    Socioeconomic Pre 28.21 %    Socioeconomic Post 26.79 %    Socioeconomic % Change  -5.03 %    Psych/Spiritual Pre 28.29 %    Psych/Spiritual Post 25.57 %    Psych/Spiritual % Change -9.61 %    Family Pre 30 %    Family Post 30 %    Family % Change 0 %    GLOBAL Pre 25.85 %    GLOBAL Post 25.15 %    GLOBAL % Change -2.71 %            Scores of 19 and below usually indicate a poorer quality of life in these areas.  A difference of  2-3 points is a clinically meaningful difference.  A difference of 2-3 points in the total score of the Quality of Life Index has been associated with significant improvement in overall quality of life, self-image, physical symptoms, and general health in studies assessing change in quality of  life.  PHQ-9: Review Flowsheet       11/10/2022 09/28/2022 08/29/2022  Depression screen PHQ 2/9  Decreased Interest 0 0 0  Down, Depressed, Hopeless 0 0 0  PHQ - 2 Score 0 0 0  Altered sleeping 3 2 2   Tired, decreased energy 3 1 3   Change in appetite 1 0 0  Feeling bad or failure about yourself  0 0 0  Trouble concentrating 0 0 0  Moving slowly or fidgety/restless 0 0 0  Suicidal thoughts 0 0 0  PHQ-9 Score 7 3 5   Difficult doing work/chores Not difficult at all Not difficult at all Not difficult at all    Details           Interpretation of Total Score  Total Score Depression Severity:  1-4 = Minimal depression, 5-9 = Mild depression, 10-14 = Moderate depression, 15-19 = Moderately severe depression, 20-27 = Severe depression   Psychosocial Evaluation and Intervention:  Psychosocial Evaluation - 08/24/22 1054       Psychosocial Evaluation & Interventions   Interventions Encouraged to exercise with the program and follow exercise prescription    Comments Subrena has no barriers to starting the program. She lives alone and has neigbors in the other condos in her complex. Her daughter and a good friend are her support. SHe is ready to stop being a "couch potato". Sh eis concerned about her weight loss and wants to gain back to her weight of 112 LB. She wants to meet with the RD as soon as possible. She is ready to get started.    Expected Outcomes STG Averyanna attends all scheduled sessions, she is able to meet with our new RD when he starts  working in the program in early June.  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 She has successfully dropped from 20 cigarettes a day to 2 a day. Her goal is total cessationLTG Cherly has resources and direrction to work on her weight gain and she has quit tobacco .    Continue Psychosocial Services  Follow up required by staff             Psychosocial Re-Evaluation:  Psychosocial Re-Evaluation     Row  Name 10/27/22 1636             Psychosocial Re-Evaluation   Current issues with None Identified       Comments Patient reports no issues with their current mental states, sleep, stress, depression or anxiety. Will follow up with patient in a few weeks for any changes.       Expected Outcomes Short: Continue to exercise regularly to support mental health and notify staff of any changes. Long: maintain mental health and well being through teaching of rehab or prescribed medications independently.       Interventions Encouraged to attend Cardiac Rehabilitation for the exercise       Continue Psychosocial Services  Follow up required by staff                Psychosocial Discharge (Final Psychosocial Re-Evaluation):  Psychosocial Re-Evaluation - 10/27/22 1636       Psychosocial Re-Evaluation   Current issues with None Identified    Comments Patient reports no issues with their current mental states, sleep, stress, depression or anxiety. Will follow up with patient in a few weeks for any changes.    Expected Outcomes Short: Continue to exercise regularly to support mental health and notify staff of any changes. Long: maintain mental health and well being through teaching of rehab or prescribed medications independently.    Interventions Encouraged to attend Cardiac Rehabilitation for the exercise    Continue Psychosocial Services  Follow up required by staff             Vocational Rehabilitation: Provide vocational rehab assistance to qualifying candidates.   Vocational Rehab Evaluation & Intervention:   Education: Education Goals: Education classes will  be provided on a variety of topics geared toward better understanding of heart health and risk factor modification. Participant will state understanding/return demonstration of topics presented as noted by education test scores.  Learning Barriers/Preferences:  Learning Barriers/Preferences - 08/24/22 1042       Learning  Barriers/Preferences   Learning Barriers None    Learning Preferences None             General Cardiac Education Topics:  AED/CPR: - Group verbal and written instruction with the use of models to demonstrate the basic use of the AED with the basic ABC's of resuscitation.   Anatomy and Cardiac Procedures: - Group verbal and visual presentation and models provide information about basic cardiac anatomy and function. Reviews the testing methods done to diagnose heart disease and the outcomes of the test results. Describes the treatment choices: Medical Management, Angioplasty, or Coronary Bypass Surgery for treating various heart conditions including Myocardial Infarction, Angina, Valve Disease, and Cardiac Arrhythmias.  Written material given at graduation.   Medication Safety: - Group verbal and visual instruction to review commonly prescribed medications for heart and lung disease. Reviews the medication, class of the drug, and side effects. Includes the steps to properly store meds and maintain the prescription regimen.  Written material given at graduation. Flowsheet Row Cardiac Rehab from 10/19/2022 in [facility] Cardiac and Pulmonary Rehab  Date 10/19/22  Educator MS  Instruction Review Code 1- Verbalizes Understanding       Intimacy: - Group verbal instruction through game format to discuss how heart and lung disease can affect sexual intimacy. Written material given at graduation.. Flowsheet Row Cardiac Rehab from 10/19/2022 in [facility] Cardiac and Pulmonary Rehab  Date 09/14/22  Educator [facility]  Instruction Review Code 1- Verbalizes Understanding       Know Your Numbers and Heart Failure: - Group verbal and visual instruction to discuss disease risk factors for cardiac and pulmonary disease and treatment options.  Reviews associated critical values for Overweight/Obesity, Hypertension, Cholesterol, and Diabetes.  Discusses basics of heart failure: signs/symptoms and treatments.   Introduces Heart Failure Zone chart for action plan for heart failure.  Written material given at graduation.   Infection Prevention: - Provides verbal and written material to individual with discussion of infection control including proper hand washing and proper equipment cleaning during exercise session. Flowsheet Row Cardiac Rehab from 10/19/2022 in [facility] Cardiac and Pulmonary Rehab  Date 08/29/22  Educator [facility]  Instruction Review Code 1- Verbalizes Understanding       Falls Prevention: - Provides verbal and written material to individual with discussion of falls prevention and safety. Flowsheet Row Cardiac Rehab from 10/19/2022 in [facility] Cardiac and Pulmonary Rehab  Date 08/29/22  Educator [facility]  Instruction Review Code 1- Verbalizes Understanding       Other: -Provides group and verbal instruction on various topics (see comments) Flowsheet Row Cardiac Rehab from 10/19/2022 in [facility] Cardiac and Pulmonary Rehab  Date 09/21/22  Educator [facility]  [10/05/22- [facility]]  Instruction Review Code 1- Verbalizes Understanding       Knowledge Questionnaire Score:  Knowledge Questionnaire Score - 11/10/22 1615       Knowledge Questionnaire Score   Post Score 25/26             Core Components/Risk Factors/Patient Goals at Admission:  Personal Goals and Risk Factors at Admission - 08/29/22 1728       Core Components/Risk Factors/Patient Goals on Admission    Weight Management Yes;Weight Gain    Intervention  Weight Management: Develop a combined nutrition and exercise program designed to reach desired caloric intake, while maintaining appropriate intake of nutrient and fiber, sodium and fats, and appropriate energy expenditure required for the weight goal.;Weight Management: Provide education and appropriate resources to help participant work on and attain dietary goals.    Admit Weight 109 lb 8 oz (49.7 kg)    Goal Weight: Short Term 115 lb (52.2 kg)    Goal Weight: Long Term  115 lb (52.2 kg)    Expected Outcomes Short Term: Continue to assess and modify interventions until short term weight is achieved;Long Term: Adherence to nutrition and physical activity/exercise program aimed toward attainment of established weight goal;Weight Gain: Understanding of general recommendations for a high calorie, high protein meal plan that promotes weight gain by distributing calorie intake throughout the day with the consumption for 4-5 meals, snacks, and/or supplements;Understanding recommendations for meals to include 15-35% energy as protein, 25-35% energy from fat, 35-60% energy from carbohydrates, less than 200mg  of dietary cholesterol, 20-35 gm of total fiber daily;Understanding of distribution of calorie intake throughout the day with the consumption of 4-5 meals/snacks    Tobacco Cessation Yes    Number of packs per day 4 cigarettes a day  working towards cessation.    Anyela is a current tobacco user. Intervention for tobacco cessation was provided at the initial medical review. She was asked about readiness to quit and reported that she is down to 2 cigarettes a day from a pack. She is working towards cessation. No quit date gas been sedt.  . Patient was advised and educated about tobacco cessation using combination therapy, tobacco cessation classes, quit line, and quit smoking apps. Patient demonstrated understanding of this material. Staff will continue to provide encouragement and follow up with the patient throughout the program.    Intervention Assist the participant in steps to quit. Provide individualized education and counseling about committing to Tobacco Cessation, relapse prevention, and pharmacological support that can be provided by physician.;Education officer, environmental, assist with locating and accessing local/national Quit Smoking programs, and support quit date choice.    Expected Outcomes Short Term: Will demonstrate readiness to quit, by selecting a quit  date.;Short Term: Will quit all tobacco product use, adhering to prevention of relapse plan.;Long Term: Complete abstinence from all tobacco products for at least 12 months from quit date.    Hypertension Yes    Intervention Provide education on lifestyle modifcations including regular physical activity/exercise, weight management, moderate sodium restriction and increased consumption of fresh fruit, vegetables, and low fat dairy, alcohol moderation, and smoking cessation.;Monitor prescription use compliance.    Expected Outcomes Short Term: Continued assessment and intervention until BP is < 140/37mm HG in hypertensive participants. < 130/16mm HG in hypertensive participants with diabetes, heart failure or chronic kidney disease.;Long Term: Maintenance of blood pressure at goal levels.    Lipids Yes    Intervention Provide education and support for participant on nutrition & aerobic/resistive exercise along with prescribed medications to achieve LDL 70mg , HDL >40mg .    Expected Outcomes Short Term: Participant states understanding of desired cholesterol values and is compliant with medications prescribed. Participant is following exercise prescription and nutrition guidelines.;Long Term: Cholesterol controlled with medications as prescribed, with individualized exercise RX and with personalized nutrition plan. Value goals: LDL < 70mg , HDL > 40 mg.             Education:Diabetes - Individual verbal and written instruction to review signs/symptoms of diabetes, desired ranges of glucose  level fasting, after meals and with exercise. Acknowledge that pre and post exercise glucose checks will be done for 3 sessions at entry of program.   Core Components/Risk Factors/Patient Goals Review:   Goals and Risk Factor Review     Row Name 10/27/22 1632             Core Components/Risk Factors/Patient Goals Review   Personal Goals Review Tobacco Cessation       Review Jearlene had 4 cigaretts today.  She is trying to cut back but not working on quitting. When asked she said she enjoys smoking and is not sure if she wants to quit. She has been smoking since she was 77 years old. She understands that it is bad for her and will continue to evaluate if she wants to quit.       Expected Outcomes Short: Continue to cut back on smoking. Long: reduce smoking as much as she can.                Core Components/Risk Factors/Patient Goals at Discharge (Final Review):   Goals and Risk Factor Review - 10/27/22 1632       Core Components/Risk Factors/Patient Goals Review   Personal Goals Review Tobacco Cessation    Review Kyoko had 4 cigaretts today. She is trying to cut back but not working on quitting. When asked she said she enjoys smoking and is not sure if she wants to quit. She has been smoking since she was 77 years old. She understands that it is bad for her and will continue to evaluate if she wants to quit.    Expected Outcomes Short: Continue to cut back on smoking. Long: reduce smoking as much as she can.             ITP Comments:  ITP Comments     Row Name 08/24/22 1058 08/29/22 1721 09/07/22 1610 09/21/22 1143 10/18/22 1517   ITP Comments Virtual orientation call completed today. shehas an appointment on Date: 08/29/2022  for EP eval and gym Orientation.  Documentation of diagnosis can be found in [facility]  Date: 07/08/2022 .   Kerilynn is a current tobacco user. Intervention for tobacco cessation was provided at the initial medical review. She was asked about readiness to quit and reported that she is down to 2 cigarettes a day from a pack. She is working towards cessation. No quit date gas been sedt.  . Patient was advised and educated about tobacco cessation using combination therapy, tobacco cessation classes, quit line, and quit smoking apps. Patient demonstrated understanding of this material. Staff will continue to provide encouragement and follow up with the patient throughout the  program. Completed and gym orientation. Initial ITP created and sent for review to Dr. Bethann Punches, Medical Director.Charlinda is a current tobacco user. Intervention for tobacco cessation was provided at the initial medical review. She was asked about readiness to quit and reported she is down to 4 cigarettes a day and desires to quit but has not chosen a quit date yet . Patient was advised and educated about tobacco cessation using combination therapy, tobacco cessation classes, quit line, and quit smoking apps. Patient demonstrated understanding of this material. Staff will continue to provide encouragement and follow up with the patient throughout the program. First full day of exercise!  Patient was oriented to gym and equipment including functions, settings, policies, and procedures.  Patient's individual exercise prescription and treatment plan were reviewed.  All starting workloads were established  based on the results of the 6 minute walk test done at initial orientation visit.  The plan for exercise progression was also introduced and progression will be customized based on patient's performance and goals. 30 Day review completed. Medical Director ITP review done, changes made as directed, and signed approval by Medical Director.    new to program 30 Day review completed. Medical Director ITP review done, changes made as directed, and signed approval by Medical Director.    Row Name 11/16/22 1213           ITP Comments 30 Day review completed. Medical Director ITP review done, changes made as directed, and signed approval by Medical Director.                Comments:

## 2022-11-18 LAB — COLOGUARD: COLOGUARD: NEGATIVE

## 2022-11-21 ENCOUNTER — Encounter: Payer: Medicare Other | Admitting: *Deleted

## 2022-11-21 DIAGNOSIS — I214 Non-ST elevation (NSTEMI) myocardial infarction: Secondary | ICD-10-CM

## 2022-11-21 DIAGNOSIS — Z955 Presence of coronary angioplasty implant and graft: Secondary | ICD-10-CM

## 2022-11-21 NOTE — Progress Notes (Signed)
Daily Session Note  Patient Details  Name: Tiffany Munoz MRN: [id number] Date of Birth: 11-Dec-1945 Referring Provider:   Flowsheet Row Cardiac Rehab from 08/29/2022 in [facility] Cardiac and Pulmonary Rehab  Referring Provider Tiffany Munoz       Encounter Date: 11/21/2022  Check In:  Session Check In - 11/21/22 1642       Check-In   Supervising physician immediately available to respond to emergencies See telemetry face sheet for immediately available ER MD    Location [facility]-Cardiac & Pulmonary Rehab    Staff Present Tiffany Hurst, RN, ADN;Tiffany Munoz, RCP,RRT,BSRT; Tiffany Baize, RN BSN    Virtual Visit No    Medication changes reported     No    Fall or balance concerns reported    No    Tobacco Cessation No Change    Warm-up and Cool-down Performed on first and last piece of equipment    Resistance Training Performed Yes    VAD Patient? No    PAD/SET Patient? No      Pain Assessment   Currently in Pain? No/denies                Social History   Tobacco Use  Smoking Status Every Day   Current packs/day: 0.50   Average packs/day: 0.5 packs/day for 55.0 years (27.5 ttl pk-yrs)   Types: Cigarettes  Smokeless Tobacco Never  Tobacco Comments   2 cigarettes a day working toward cessation    Goals Met:  Independence with exercise equipment Exercise tolerated well No report of concerns or symptoms today Strength training completed today  Goals Unmet:  Not Applicable  Comments:  Tiffany Munoz graduated today from  rehab with 36 sessions completed.  Details of the patient's exercise prescription and what She needs to do in order to continue the prescription and progress were discussed with patient.  Patient was given a copy of prescription and goals.  Patient verbalized understanding.  Tiffany Munoz plans to continue to exercise by walking.   Dr. Bethann Punches is Medical Director for [facility] Cardiac Rehabilitation.  Dr. Vida Rigger is Medical Director for [facility] Pulmonary  Rehabilitation.

## 2022-11-21 NOTE — Progress Notes (Signed)
Cardiac Individual Treatment Plan  Patient Details  Name: Lashanta Gagne MRN: [id number] Date of Birth: Jul 26, 1945 Referring Provider:   Flowsheet Row Cardiac Rehab from 08/29/2022 in [facility] Cardiac and Pulmonary Rehab  Referring Provider Arida       Initial Encounter Date:  Flowsheet Row Cardiac Rehab from 08/29/2022 in [facility] Cardiac and Pulmonary Rehab  Date 08/29/22       Visit Diagnosis: Status post coronary artery stent placement  NSTEMI (non-ST elevation myocardial infarction) [facility])  Patient's Home Medications on Admission:  Current Outpatient Medications:    amLODipine (NORVASC) 5 MG tablet, Take 1 tablet (5 mg total) by mouth daily., Disp: 90 tablet, Rfl: 3   aspirin 81 MG tablet, Take 1-2 tablets by mouth daily., Disp: , Rfl:    carvedilol (COREG) 3.125 MG tablet, Take 1 tablet (3.125 mg total) by mouth 2 (two) times daily with a meal., Disp: 180 tablet, Rfl: 3   cetirizine (ZYRTEC ALLERGY) 10 MG tablet, Take 1 tablet by mouth daily as needed for rhinitis or allergies., Disp: , Rfl:    clobetasol cream (TEMOVATE) 0.05 %, Apply 1 Application topically 3 (three) times daily as needed., Disp: , Rfl:    clopidogrel (PLAVIX) 75 MG tablet, Take 1 tablet (75 mg total) by mouth daily., Disp: 90 tablet, Rfl: 3   Evolocumab (REPATHA SURECLICK) 140 MG/ML SOAJ, ADMINISTER 1 ML UNDER THE SKIN EVERY 14 DAYS, Disp: 6 mL, Rfl: 3   nitroGLYCERIN (NITROSTAT) 0.4 MG SL tablet, Place 1 tablet (0.4 mg total) under the tongue every 5 (five) minutes x 3 doses as needed for chest pain., Disp: 30 tablet, Rfl: 2   potassium chloride (K-DUR,KLOR-CON) 10 MEQ tablet, Take 1 tablet by mouth daily., Disp: , Rfl:    sodium fluoride (PREVIDENT) 1.1 % GEL dental gel, Take 1 application by mouth daily. (Patient not taking: Reported on 08/02/2022), Disp: , Rfl:    traMADol (ULTRAM) 50 MG tablet, Take 50 mg by mouth every 8 (eight) hours as needed for severe pain or moderate pain. (Patient not taking: Reported on  11/09/2022), Disp: , Rfl:   Past Medical History: Past Medical History:  Diagnosis Date   AAA (abdominal aortic aneurysm) (HCC)    Hypertension    Migraine     Tobacco Use: Social History   Tobacco Use  Smoking Status Every Day   Current packs/day: 0.50   Average packs/day: 0.5 packs/day for 55.0 years (27.5 ttl pk-yrs)   Types: Cigarettes  Smokeless Tobacco Never  Tobacco Comments   2 cigarettes a day working toward cessation    Labs: Review Flowsheet       Latest Ref Rng & Units 03/29/2018 07/09/2022  Labs for ITP Cardiac and Pulmonary Rehab  Cholestrol 0 - 200 mg/dL - 409   LDL (calc) 0 - 99 mg/dL - 811   HDL-C >91 mg/dL - 67   Trlycerides <478 mg/dL - 83   Hemoglobin G9F 4.8 - 5.6 % - 5.1   TCO2 22 - 32 mmol/L 31  -    Details             Exercise Target Goals: Exercise Program Goal: Individual exercise prescription set using results from initial 6 min walk test and THRR while considering  patient's activity barriers and safety.   Exercise Prescription Goal: Initial exercise prescription builds to 30-45 minutes a day of aerobic activity, 2-3 days per week.  Home exercise guidelines will be given to patient during program as part of exercise prescription that the  participant will acknowledge.   Education: Aerobic Exercise: - Group verbal and visual presentation on the components of exercise prescription. Introduces F.I.T.T principle from ACSM for exercise prescriptions.  Reviews F.I.T.T. principles of aerobic exercise including progression. Written material given at graduation. Flowsheet Row Cardiac Rehab from 10/19/2022 in [facility] Cardiac and Pulmonary Rehab  Education need identified 08/29/22  Date 09/14/22  Educator [facility]  Instruction Review Code 1- [employer] Understanding       Education: Resistance Exercise: - Group verbal and visual presentation on the components of exercise prescription. Introduces F.I.T.T principle from ACSM for exercise  prescriptions  Reviews F.I.T.T. principles of resistance exercise including progression. Written material given at graduation.    Education: Exercise & Equipment Safety: - Individual verbal instruction and demonstration of equipment use and safety with use of the equipment. Flowsheet Row Cardiac Rehab from 10/19/2022 in [facility] Cardiac and Pulmonary Rehab  Date 08/29/22  Educator [facility]  Instruction Review Code 1- Verbalizes Understanding       Education: Exercise Physiology & General Exercise Guidelines: - Group verbal and written instruction with models to review the exercise physiology of the cardiovascular system and associated critical values. Provides general exercise guidelines with specific guidelines to those with heart or lung disease.  Flowsheet Row Cardiac Rehab from 10/19/2022 in [facility] Cardiac and Pulmonary Rehab  Education need identified 08/29/22       Education: Flexibility, Balance, Mind/Body Relaxation: - Group verbal and visual presentation with interactive activity on the components of exercise prescription. Introduces F.I.T.T principle from ACSM for exercise prescriptions. Reviews F.I.T.T. principles of flexibility and balance exercise training including progression. Also discusses the mind body connection.  Reviews various relaxation techniques to help reduce and manage stress (i.e. Deep breathing, progressive muscle relaxation, and visualization). Balance handout provided to take home. Written material given at graduation. Flowsheet Row Cardiac Rehab from 10/19/2022 in [facility] Cardiac and Pulmonary Rehab  Date 09/28/22  Educator [facility]  Instruction Review Code 1- Verbalizes Understanding       Activity Barriers & Risk Stratification:  Activity Barriers & Cardiac Risk Stratification - 08/29/22 1724       Activity Barriers & Cardiac Risk Stratification   Activity Barriers None    Cardiac Risk Stratification High             6 Minute Walk:  6 Minute Walk     Row Name  08/29/22 1723 11/09/22 1619       6 Minute Walk   Phase Initial Discharge    Distance 1000 feet 1110 feet    Distance % Change -- 11 %    Distance Feet Change -- 110 ft    Walk Time 6 minutes 6 minutes    # of Rest Breaks 0 0    MPH 1.89 2.1    METS 2.33 2.46    RPE 12 13    Perceived Dyspnea  1 1    VO2 Peak 8.16 8.61    Symptoms Yes (comment) Yes (comment)    Comments legs burning feeling during walk test PAD, both legs pain    Resting HR 68 bpm 67 bpm    Resting BP 122/70 128/60    Resting Oxygen Saturation  99 % 96 %    Exercise Oxygen Saturation  during 6 min walk 97 % 96 %    Max Ex. HR 77 bpm 72 bpm    Max Ex. BP 128/70 124/64    2 Minute Post BP 110/60 110/62  Oxygen Initial Assessment:   Oxygen Re-Evaluation:   Oxygen Discharge (Final Oxygen Re-Evaluation):   Initial Exercise Prescription:  Initial Exercise Prescription - 08/29/22 1700       Date of Initial Exercise RX and Referring Provider   Date 08/29/22    Referring Provider Arida      Oxygen   Maintain Oxygen Saturation 88% or higher      Treadmill   MPH 1.5    Grade 0.5    Minutes 15    METs 2.33      NuStep   Level 2    SPM 80    Minutes 15    METs 2.33      Recumbant Elliptical   Level 1    RPM 50    Minutes 15    METs 2.33      Biostep-RELP   Level 1    SPM 50    Minutes 15    METs 2.33      Track   Laps 25    Minutes 15    METs 2.36      Prescription Details   Frequency (times per week) 3    Duration Progress to 30 minutes of continuous aerobic without signs/symptoms of physical distress      Intensity   THRR 40-80% of Max Heartrate 98-129    Ratings of Perceived Exertion 11-13    Perceived Dyspnea 0-4      Progression   Progression Continue to progress workloads to maintain intensity without signs/symptoms of physical distress.      Resistance Training   Training Prescription Yes    Weight 2    Reps 10-15             Perform Capillary  Blood Glucose checks as needed.  Exercise Prescription Changes:   Exercise Prescription Changes     Row Name 08/29/22 1700 09/13/22 0800 09/28/22 0700 10/12/22 1500 10/26/22 1400     Response to Exercise   Blood Pressure (Admit) 122/70 120/76 122/64 102/70 120/66   Blood Pressure (Exercise) 128/70 134/70 140/62 140/68 --   Blood Pressure (Exit) 110/60 128/72 122/66 110/60 116/64   Heart Rate (Admit) 68 bpm 68 bpm 69 bpm 70 bpm 84 bpm   Heart Rate (Exercise) 77 bpm 85 bpm 95 bpm 96 bpm 91 bpm   Heart Rate (Exit) 71 bpm 69 bpm 77 bpm 74 bpm 71 bpm   Oxygen Saturation (Admit) 99 % -- -- -- --   Oxygen Saturation (Exercise) 97 % -- -- -- --   Oxygen Saturation (Exit) 95 % -- -- -- --   Rating of Perceived Exertion (Exercise) 12 13 13 13 13    Perceived Dyspnea (Exercise) 1 -- -- -- --   Symptoms legs burned a little none none none none   Comments 6 MWT results third full day of exercise -- -- --   Duration -- Progress to 30 minutes of  aerobic without signs/symptoms of physical distress Continue with 30 min of aerobic exercise without signs/symptoms of physical distress. Continue with 30 min of aerobic exercise without signs/symptoms of physical distress. Continue with 30 min of aerobic exercise without signs/symptoms of physical distress.   Intensity -- THRR unchanged THRR unchanged THRR unchanged THRR unchanged     Progression   Progression -- Continue to progress workloads to maintain intensity without signs/symptoms of physical distress. Continue to progress workloads to maintain intensity without signs/symptoms of physical distress. Continue to progress workloads to maintain intensity without signs/symptoms of  physical distress. Continue to progress workloads to maintain intensity without signs/symptoms of physical distress.   Average METs -- 2.51 2.14 2.33 2.74     Resistance Training   Training Prescription -- Yes Yes Yes Yes   Weight -- 2 lb 3 lb 3 lb 5 lb   Reps -- 10-15 10-15  10-15 10-15     Interval Training   Interval Training -- No No No No     Treadmill   MPH -- 1.6 2 1.9 2   Grade -- 0.5 2 2 2    Minutes -- 15 15 15 15    METs -- 2.32 3.08 2.98 3.08     Recumbant Bike   Level -- 1 -- 1 1.8   Watts -- -- -- 25 25   Minutes -- 15 -- 15 15   METs -- -- -- 1.4 3.55     NuStep   Level -- 2 2 2  --   Minutes -- 15 15 15  --   METs -- 2.7 1.3 1.6 --     REL-XR   Level -- -- 3 -- 1   Minutes -- -- 15 -- 15   METs -- -- 1.5 -- 2.7     T5 Nustep   Level -- -- 2 -- 1   Minutes -- -- 15 -- 15   METs -- -- 1.7 -- 1.8     Biostep-RELP   Level -- -- 1 2 2    Minutes -- -- 15 15 15    METs -- -- 2 2 2      Track   Laps -- -- 25 -- --   Minutes -- -- 15 -- --   METs -- -- 2.36 -- --     Oxygen   Maintain Oxygen Saturation -- 88% or higher 88% or higher 88% or higher 88% or higher    Row Name 11/10/22 1500             Response to Exercise   Blood Pressure (Admit) 130/70       Blood Pressure (Exit) 102/60       Heart Rate (Admit) 70 bpm       Heart Rate (Exercise) 85 bpm       Heart Rate (Exit) 72 bpm       Rating of Perceived Exertion (Exercise) 12       Symptoms none       Duration Continue with 30 min of aerobic exercise without signs/symptoms of physical distress.       Intensity THRR unchanged         Progression   Progression Continue to progress workloads to maintain intensity without signs/symptoms of physical distress.       Average METs 2.31         Resistance Training   Training Prescription Yes       Weight 5 lb       Reps 10-15         Interval Training   Interval Training No         Treadmill   MPH 1.9       Grade 1       Minutes 15       METs 2.72         REL-XR   Level 1       Minutes 15       METs 1.5         Track   Laps 24  Minutes 15       METs 2.31         Oxygen   Maintain Oxygen Saturation 88% or higher                Exercise Comments:   Exercise Comments     Row Name 09/07/22  1611           Exercise Comments First full day of exercise!  Patient was oriented to gym and equipment including functions, settings, policies, and procedures.  Patient's individual exercise prescription and treatment plan were reviewed.  All starting workloads were established based on the results of the 6 minute walk test done at initial orientation visit.  The plan for exercise progression was also introduced and progression will be customized based on patient's performance and goals.                Exercise Goals and Review:   Exercise Goals     Row Name 08/29/22 1727             Exercise Goals   Increase Physical Activity Yes       Intervention Provide advice, education, support and counseling about physical activity/exercise needs.;Develop an individualized exercise prescription for aerobic and resistive training based on initial evaluation findings, risk stratification, comorbidities and participant's personal goals.       Expected Outcomes Short Term: Attend rehab on a regular basis to increase amount of physical activity.;Long Term: Add in home exercise to make exercise part of routine and to increase amount of physical activity.;Long Term: Exercising regularly at least 3-5 days a week.       Increase Strength and Stamina Yes       Intervention Provide advice, education, support and counseling about physical activity/exercise needs.;Develop an individualized exercise prescription for aerobic and resistive training based on initial evaluation findings, risk stratification, comorbidities and participant's personal goals.       Expected Outcomes Short Term: Increase workloads from initial exercise prescription for resistance, speed, and METs.;Short Term: Perform resistance training exercises routinely during rehab and add in resistance training at home;Long Term: Improve cardiorespiratory fitness, muscular endurance and strength as measured by increased METs and functional  capacity ( )       Able to understand and use rate of perceived exertion (RPE) scale Yes       Intervention Provide education and explanation on how to use RPE scale       Expected Outcomes Short Term: Able to use RPE daily in rehab to express subjective intensity level;Long Term:  Able to use RPE to guide intensity level when exercising independently       Able to understand and use Dyspnea scale Yes       Intervention Provide education and explanation on how to use Dyspnea scale       Expected Outcomes Short Term: Able to use Dyspnea scale daily in rehab to express subjective sense of shortness of breath during exertion;Long Term: Able to use Dyspnea scale to guide intensity level when exercising independently       Knowledge and understanding of Target Heart Rate Range (THRR) Yes       Intervention Provide education and explanation of THRR including how the numbers were predicted and where they are located for reference       Expected Outcomes Short Term: Able to state/look up THRR;Long Term: Able to use THRR to govern intensity when exercising independently;Short Term: Able to use daily as guideline for intensity in  rehab       Able to check pulse independently Yes       Intervention Provide education and demonstration on how to check pulse in carotid and radial arteries.;Review the importance of being able to check your own pulse for safety during independent exercise       Expected Outcomes Short Term: Able to explain why pulse checking is important during independent exercise;Long Term: Able to check pulse independently and accurately       Understanding of Exercise Prescription Yes       Intervention Provide education, explanation, and written materials on patient's individual exercise prescription       Expected Outcomes Short Term: Able to explain program exercise prescription;Long Term: Able to explain home exercise prescription to exercise independently                Exercise  Goals Re-Evaluation :  Exercise Goals Re-Evaluation     Row Name 09/07/22 1613 09/13/22 0857 09/28/22 0757 10/12/22 1532 10/12/22 1533     Exercise Goal Re-Evaluation   Exercise Goals Review Increase Physical Activity;Able to understand and use rate of perceived exertion (RPE) scale;Knowledge and understanding of Target Heart Rate Range (THRR);Understanding of Exercise Prescription;Increase Strength and Stamina;Able to check pulse independently Increase Physical Activity;Increase Strength and Stamina;Understanding of Exercise Prescription Increase Physical Activity;Increase Strength and Stamina;Understanding of Exercise Prescription Increase Physical Activity;Increase Strength and Stamina;Understanding of Exercise Prescription --   Comments Reviewed RPE  and dyspnea scale, THR and program prescription with pt today.  Pt voiced understanding and was given a copy of goals to take home. Makari is off to a good start in rehab.  She has completed her first three full days of rehab thus far.  She is alread up to 2.7 METs on the NuStep.  We will continue to monitor her progress. Kristlyn is doing well in the program. She increased her treadmill workload up to 2 mph with a 2% incline. She also improved to level 3 on the XR and level 2 on the T5 nustep. She increased to 3 lb hand weights for resistance training as well. Matalie continues to do well with hter exercise progression strength and stamina. Lucresha continues to do well with her exercise progression strength and stamina. She has increased the Biostep to level 2  and increased her treadmill from 1.7/2% to 1.9/2%and is maintaining all other workloads.  Will continue to monitor her progression.   Expected Outcomes Short: Use RPE daily to regulate intensity.  Long: Follow program prescription in THR. Short: Continue to to attend rehab regularly Long: Continue to follow program prescription Short: Continue to progressively increase treadmill workload. Long: Continue  to improve strength and stamina. -- STG  continue to increse workloads as tolerated. LTG continue exercie progression to help improve strength and stamina    Row Name 10/26/22 1409 11/10/22 1546           Exercise Goal Re-Evaluation   Exercise Goals Review Increase Physical Activity;Increase Strength and Stamina;Understanding of Exercise Prescription Increase Physical Activity;Increase Strength and Stamina;Understanding of Exercise Prescription      Comments Jayliana continues to do well in rehab. She increased her treadmill workload back up to her previous speed of 2 mph with an incline of 2%. She also improved to level 1.8 on the recumbent bike, and increased from 3 lb to 5 lb hand weights for resistance training. We will continue to monitor her progress in the program. Alliya continues to do well in rehab. She recently  completed her post and improved by 11%. She also continues to do well walking on both the track and the treadmill. She also continues to do well with 5 lb hand weights for resistance training. We will continue to monitor her progress in the program.      Expected Outcomes Short: Continue to progressively increase treadmill workload. Long: Continue to improve strength and stamina. Short: Graduate. Long: Continue to exercise independently.               Discharge Exercise Prescription (Final Exercise Prescription Changes):  Exercise Prescription Changes - 11/10/22 1500       Response to Exercise   Blood Pressure (Admit) 130/70    Blood Pressure (Exit) 102/60    Heart Rate (Admit) 70 bpm    Heart Rate (Exercise) 85 bpm    Heart Rate (Exit) 72 bpm    Rating of Perceived Exertion (Exercise) 12    Symptoms none    Duration Continue with 30 min of aerobic exercise without signs/symptoms of physical distress.    Intensity THRR unchanged      Progression   Progression Continue to progress workloads to maintain intensity without signs/symptoms of physical distress.     Average METs 2.31      Resistance Training   Training Prescription Yes    Weight 5 lb    Reps 10-15      Interval Training   Interval Training No      Treadmill   MPH 1.9    Grade 1    Minutes 15    METs 2.72      REL-XR   Level 1    Minutes 15    METs 1.5      Track   Laps 24    Minutes 15    METs 2.31      Oxygen   Maintain Oxygen Saturation 88% or higher             Nutrition:  Target Goals: Understanding of nutrition guidelines, daily intake of sodium 1500mg , cholesterol 200mg , calories 30% from fat and 7% or less from saturated fats, daily to have 5 or more servings of fruits and vegetables.  Education: All About Nutrition: -Group instruction provided by verbal, written material, interactive activities, discussions, models, and posters to present general guidelines for heart healthy nutrition including fat, fiber, MyPlate, the role of sodium in heart healthy nutrition, utilization of the nutrition label, and utilization of this knowledge for meal planning. Follow up email sent as well. Written material given at graduation. Flowsheet Row Cardiac Rehab from 10/19/2022 in [facility] Cardiac and Pulmonary Rehab  Education need identified 08/29/22       Biometrics:  Pre Biometrics - 08/29/22 1727       Pre Biometrics   Height 5\' 5"  (1.651 m)    Weight 109 lb 8 oz (49.7 kg)    Waist Circumference 26.5 inches    Hip Circumference 35 inches    Waist to Hip Ratio 0.76 %    BMI (Calculated) 18.22    Single Leg Stand 30 seconds             Post Biometrics - 11/09/22 1621        Post  Biometrics   Height 5' 4.02" (1.626 m)    Weight 109 lb (49.4 kg)    BMI (Calculated) 18.7    Single Leg Stand 18 seconds             Nutrition Therapy Plan  and Nutrition Goals:  Nutrition Therapy & Goals - 11/14/22 1713       Nutrition Therapy   Diet Cardiac, low Na    Protein (specify units) 90    Fiber 25 grams    Whole Grain Foods 3 servings    Saturated  Fats 15 max. grams    Fruits and Vegetables 5 servings/day    Sodium 2 grams      Personal Nutrition Goals   Nutrition Goal Eat small frequent meals    Personal Goal #2 Pair a carb with a protein/fat    Personal Goal #3 Prioritize healthy fats to meet calorie needs    Comments Patient drinking water mostly. She doesn't have the biggest appetite, usually gets 2 meals in per day. Will then snack. She lost weight during her hospital stay and has been working on gaining. Educated on calorie density, encouraged more healthy fats, full fat dairy (in small portions) if needed. Set goal to have small but more frequent meals, not to miss meals, have a impactful snack. Brainstormed some examples like apple with peanut butter, cheese, crackers, handful of nuts, ect. Reviewed the mediterranean diet handout, reminded her to focus on small frequent meal with healthy fats rather than filling up on low calorie foods like veggies and soups.      Intervention Plan   Intervention Prescribe, educate and counsel regarding individualized specific dietary modifications aiming towards targeted core components such as weight, hypertension, lipid management, diabetes, heart failure and other comorbidities.;Nutrition handout(s) given to patient.    Expected Outcomes Short Term Goal: Understand basic principles of dietary content, such as calories, fat, sodium, cholesterol and nutrients.;Short Term Goal: A plan has been developed with personal nutrition goals set during dietitian appointment.;Long Term Goal: Adherence to prescribed nutrition plan.             Nutrition Assessments:  MEDIFICTS Score Key: ?70 Need to make dietary changes  40-70 Heart Healthy Diet ? 40 Therapeutic Level Cholesterol Diet  Flowsheet Row Cardiac Rehab from 11/10/2022 in [facility] Cardiac and Pulmonary Rehab  Picture Your Plate Total Score on Discharge 62      Picture Your Plate Scores: <82 Unhealthy dietary pattern with much room for  improvement. 41-50 Dietary pattern unlikely to meet recommendations for good health and room for improvement. 51-60 More healthful dietary pattern, with some room for improvement.  >60 Healthy dietary pattern, although there may be some specific behaviors that could be improved.    Nutrition Goals Re-Evaluation:  Nutrition Goals Re-Evaluation     Row Name 10/27/22 1634             Goals   Nutrition Goal Meet with RD.       Comment Patient would like to meet with RD.       Expected Outcome Short: meet with RD. Long:adhere to a diet that pertains to her.                Nutrition Goals Discharge (Final Nutrition Goals Re-Evaluation):  Nutrition Goals Re-Evaluation - 10/27/22 1634       Goals   Nutrition Goal Meet with RD.    Comment Patient would like to meet with RD.    Expected Outcome Short: meet with RD. Long:adhere to a diet that pertains to her.             Psychosocial: Target Goals: Acknowledge presence or absence of significant depression and/or stress, maximize coping skills, provide positive support system. Participant is able to  verbalize types and ability to use techniques and skills needed for reducing stress and depression.   Education: Stress, Anxiety, and Depression - Group verbal and visual presentation to define topics covered.  Reviews how body is impacted by stress, anxiety, and depression.  Also discusses healthy ways to reduce stress and to treat/manage anxiety and depression.  Written material given at graduation.   Education: Sleep Hygiene -Provides group verbal and written instruction about how sleep can affect your health.  Define sleep hygiene, discuss sleep cycles and impact of sleep habits. Review good sleep hygiene tips.    Initial Review & Psychosocial Screening:  Initial Psych Review & Screening - 08/24/22 1039       Initial Review   Current issues with None Identified      Family Dynamics   Good Support System? Yes   Daughter,  good friend     Barriers   Psychosocial barriers to participate in program There are no identifiable barriers or psychosocial needs.      Screening Interventions   Interventions Encouraged to exercise;To provide support and resources with identified psychosocial needs;Provide feedback about the scores to participant    Expected Outcomes Short Term goal: Utilizing psychosocial counselor, staff and physician to assist with identification of specific Stressors or current issues interfering with healing process. Setting desired goal for each stressor or current issue identified.;Long Term Goal: Stressors or current issues are controlled or eliminated.;Short Term goal: Identification and review with participant of any Quality of Life or Depression concerns found by scoring the questionnaire.;Long Term goal: The participant improves quality of Life and PHQ9 Scores as seen by post scores and/or verbalization of changes             Quality of Life Scores:   Quality of Life - 11/10/22 1615       Quality of Life   Select Quality of Life      Quality of Life Scores   Health/Function Pre 22.23 %    Health/Function Post 22.57 %    Health/Function % Change 1.53 %    Socioeconomic Pre 28.21 %    Socioeconomic Post 26.79 %    Socioeconomic % Change  -5.03 %    Psych/Spiritual Pre 28.29 %    Psych/Spiritual Post 25.57 %    Psych/Spiritual % Change -9.61 %    Family Pre 30 %    Family Post 30 %    Family % Change 0 %    GLOBAL Pre 25.85 %    GLOBAL Post 25.15 %    GLOBAL % Change -2.71 %            Scores of 19 and below usually indicate a poorer quality of life in these areas.  A difference of  2-3 points is a clinically meaningful difference.  A difference of 2-3 points in the total score of the Quality of Life Index has been associated with significant improvement in overall quality of life, self-image, physical symptoms, and general health in studies assessing change in quality of  life.  PHQ-9: Review Flowsheet       11/10/2022 09/28/2022 08/29/2022  Depression screen PHQ 2/9  Decreased Interest 0 0 0  Down, Depressed, Hopeless 0 0 0  PHQ - 2 Score 0 0 0  Altered sleeping 3 2 2   Tired, decreased energy 3 1 3   Change in appetite 1 0 0  Feeling bad or failure about yourself  0 0 0  Trouble concentrating 0 0 0  Moving slowly or fidgety/restless 0 0 0  Suicidal thoughts 0 0 0  PHQ-9 Score 7 3 5   Difficult doing work/chores Not difficult at all Not difficult at all Not difficult at all    Details           Interpretation of Total Score  Total Score Depression Severity:  1-4 = Minimal depression, 5-9 = Mild depression, 10-14 = Moderate depression, 15-19 = Moderately severe depression, 20-27 = Severe depression   Psychosocial Evaluation and Intervention:  Psychosocial Evaluation - 08/24/22 1054       Psychosocial Evaluation & Interventions   Interventions Encouraged to exercise with the program and follow exercise prescription    Comments Faylynn has no barriers to starting the program. She lives alone and has neigbors in the other condos in her complex. Her daughter and a good friend are her support. SHe is ready to stop being a "couch potato". Sh eis concerned about her weight loss and wants to gain back to her weight of 112 LB. She wants to meet with the RD as soon as possible. She is ready to get started.    Expected Outcomes STG Jamelyn attends all scheduled sessions, she is able to meet with our new RD when he starts  working in the program in early June.  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 She has successfully dropped from 20 cigarettes a day to 2 a day. Her goal is total cessationLTG Aveline has resources and direrction to work on her weight gain and she has quit tobacco .    Continue Psychosocial Services  Follow up required by staff             Psychosocial Re-Evaluation:  Psychosocial Re-Evaluation     Row  Name 10/27/22 1636             Psychosocial Re-Evaluation   Current issues with None Identified       Comments Patient reports no issues with their current mental states, sleep, stress, depression or anxiety. Will follow up with patient in a few weeks for any changes.       Expected Outcomes Short: Continue to exercise regularly to support mental health and notify staff of any changes. Long: maintain mental health and well being through teaching of rehab or prescribed medications independently.       Interventions Encouraged to attend Cardiac Rehabilitation for the exercise       Continue Psychosocial Services  Follow up required by staff                Psychosocial Discharge (Final Psychosocial Re-Evaluation):  Psychosocial Re-Evaluation - 10/27/22 1636       Psychosocial Re-Evaluation   Current issues with None Identified    Comments Patient reports no issues with their current mental states, sleep, stress, depression or anxiety. Will follow up with patient in a few weeks for any changes.    Expected Outcomes Short: Continue to exercise regularly to support mental health and notify staff of any changes. Long: maintain mental health and well being through teaching of rehab or prescribed medications independently.    Interventions Encouraged to attend Cardiac Rehabilitation for the exercise    Continue Psychosocial Services  Follow up required by staff             Vocational Rehabilitation: Provide vocational rehab assistance to qualifying candidates.   Vocational Rehab Evaluation & Intervention:   Education: Education Goals: Education classes will  be provided on a variety of topics geared toward better understanding of heart health and risk factor modification. Participant will state understanding/return demonstration of topics presented as noted by education test scores.  Learning Barriers/Preferences:  Learning Barriers/Preferences - 08/24/22 1042       Learning  Barriers/Preferences   Learning Barriers None    Learning Preferences None             General Cardiac Education Topics:  AED/CPR: - Group verbal and written instruction with the use of models to demonstrate the basic use of the AED with the basic ABC's of resuscitation.   Anatomy and Cardiac Procedures: - Group verbal and visual presentation and models provide information about basic cardiac anatomy and function. Reviews the testing methods done to diagnose heart disease and the outcomes of the test results. Describes the treatment choices: Medical Management, Angioplasty, or Coronary Bypass Surgery for treating various heart conditions including Myocardial Infarction, Angina, Valve Disease, and Cardiac Arrhythmias.  Written material given at graduation.   Medication Safety: - Group verbal and visual instruction to review commonly prescribed medications for heart and lung disease. Reviews the medication, class of the drug, and side effects. Includes the steps to properly store meds and maintain the prescription regimen.  Written material given at graduation. Flowsheet Row Cardiac Rehab from 10/19/2022 in [facility] Cardiac and Pulmonary Rehab  Date 10/19/22  Educator MS  Instruction Review Code 1- Verbalizes Understanding       Intimacy: - Group verbal instruction through game format to discuss how heart and lung disease can affect sexual intimacy. Written material given at graduation.. Flowsheet Row Cardiac Rehab from 10/19/2022 in [facility] Cardiac and Pulmonary Rehab  Date 09/14/22  Educator [facility]  Instruction Review Code 1- Verbalizes Understanding       Know Your Numbers and Heart Failure: - Group verbal and visual instruction to discuss disease risk factors for cardiac and pulmonary disease and treatment options.  Reviews associated critical values for Overweight/Obesity, Hypertension, Cholesterol, and Diabetes.  Discusses basics of heart failure: signs/symptoms and treatments.   Introduces Heart Failure Zone chart for action plan for heart failure.  Written material given at graduation.   Infection Prevention: - Provides verbal and written material to individual with discussion of infection control including proper hand washing and proper equipment cleaning during exercise session. Flowsheet Row Cardiac Rehab from 10/19/2022 in [facility] Cardiac and Pulmonary Rehab  Date 08/29/22  Educator [facility]  Instruction Review Code 1- Verbalizes Understanding       Falls Prevention: - Provides verbal and written material to individual with discussion of falls prevention and safety. Flowsheet Row Cardiac Rehab from 10/19/2022 in [facility] Cardiac and Pulmonary Rehab  Date 08/29/22  Educator [facility]  Instruction Review Code 1- Verbalizes Understanding       Other: -Provides group and verbal instruction on various topics (see comments) Flowsheet Row Cardiac Rehab from 10/19/2022 in [facility] Cardiac and Pulmonary Rehab  Date 09/21/22  Educator [facility]  [10/05/22- [facility]]  Instruction Review Code 1- Verbalizes Understanding       Knowledge Questionnaire Score:  Knowledge Questionnaire Score - 11/10/22 1615       Knowledge Questionnaire Score   Post Score 25/26             Core Components/Risk Factors/Patient Goals at Admission:  Personal Goals and Risk Factors at Admission - 08/29/22 1728       Core Components/Risk Factors/Patient Goals on Admission    Weight Management Yes;Weight Gain    Intervention  Weight Management: Develop a combined nutrition and exercise program designed to reach desired caloric intake, while maintaining appropriate intake of nutrient and fiber, sodium and fats, and appropriate energy expenditure required for the weight goal.;Weight Management: Provide education and appropriate resources to help participant work on and attain dietary goals.    Admit Weight 109 lb 8 oz (49.7 kg)    Goal Weight: Short Term 115 lb (52.2 kg)    Goal Weight: Long Term  115 lb (52.2 kg)    Expected Outcomes Short Term: Continue to assess and modify interventions until short term weight is achieved;Long Term: Adherence to nutrition and physical activity/exercise program aimed toward attainment of established weight goal;Weight Gain: Understanding of general recommendations for a high calorie, high protein meal plan that promotes weight gain by distributing calorie intake throughout the day with the consumption for 4-5 meals, snacks, and/or supplements;Understanding recommendations for meals to include 15-35% energy as protein, 25-35% energy from fat, 35-60% energy from carbohydrates, less than 200mg  of dietary cholesterol, 20-35 gm of total fiber daily;Understanding of distribution of calorie intake throughout the day with the consumption of 4-5 meals/snacks    Tobacco Cessation Yes    Number of packs per day 4 cigarettes a day  working towards cessation.    Reyhan is a current tobacco user. Intervention for tobacco cessation was provided at the initial medical review. She was asked about readiness to quit and reported that she is down to 2 cigarettes a day from a pack. She is working towards cessation. No quit date gas been sedt.  . Patient was advised and educated about tobacco cessation using combination therapy, tobacco cessation classes, quit line, and quit smoking apps. Patient demonstrated understanding of this material. Staff will continue to provide encouragement and follow up with the patient throughout the program.    Intervention Assist the participant in steps to quit. Provide individualized education and counseling about committing to Tobacco Cessation, relapse prevention, and pharmacological support that can be provided by physician.;Education officer, environmental, assist with locating and accessing local/national Quit Smoking programs, and support quit date choice.    Expected Outcomes Short Term: Will demonstrate readiness to quit, by selecting a quit  date.;Short Term: Will quit all tobacco product use, adhering to prevention of relapse plan.;Long Term: Complete abstinence from all tobacco products for at least 12 months from quit date.    Hypertension Yes    Intervention Provide education on lifestyle modifcations including regular physical activity/exercise, weight management, moderate sodium restriction and increased consumption of fresh fruit, vegetables, and low fat dairy, alcohol moderation, and smoking cessation.;Monitor prescription use compliance.    Expected Outcomes Short Term: Continued assessment and intervention until BP is < 140/62mm HG in hypertensive participants. < 130/59mm HG in hypertensive participants with diabetes, heart failure or chronic kidney disease.;Long Term: Maintenance of blood pressure at goal levels.    Lipids Yes    Intervention Provide education and support for participant on nutrition & aerobic/resistive exercise along with prescribed medications to achieve LDL 70mg , HDL >40mg .    Expected Outcomes Short Term: Participant states understanding of desired cholesterol values and is compliant with medications prescribed. Participant is following exercise prescription and nutrition guidelines.;Long Term: Cholesterol controlled with medications as prescribed, with individualized exercise RX and with personalized nutrition plan. Value goals: LDL < 70mg , HDL > 40 mg.             Education:Diabetes - Individual verbal and written instruction to review signs/symptoms of diabetes, desired ranges of glucose  level fasting, after meals and with exercise. Acknowledge that pre and post exercise glucose checks will be done for 3 sessions at entry of program.   Core Components/Risk Factors/Patient Goals Review:   Goals and Risk Factor Review     Row Name 10/27/22 1632             Core Components/Risk Factors/Patient Goals Review   Personal Goals Review Tobacco Cessation       Review Latoria had 4 cigaretts today.  She is trying to cut back but not working on quitting. When asked she said she enjoys smoking and is not sure if she wants to quit. She has been smoking since she was 77 years old. She understands that it is bad for her and will continue to evaluate if she wants to quit.       Expected Outcomes Short: Continue to cut back on smoking. Long: reduce smoking as much as she can.                Core Components/Risk Factors/Patient Goals at Discharge (Final Review):   Goals and Risk Factor Review - 10/27/22 1632       Core Components/Risk Factors/Patient Goals Review   Personal Goals Review Tobacco Cessation    Review Jessicah had 4 cigaretts today. She is trying to cut back but not working on quitting. When asked she said she enjoys smoking and is not sure if she wants to quit. She has been smoking since she was 77 years old. She understands that it is bad for her and will continue to evaluate if she wants to quit.    Expected Outcomes Short: Continue to cut back on smoking. Long: reduce smoking as much as she can.             ITP Comments:  ITP Comments     Row Name 08/24/22 1058 08/29/22 1721 09/07/22 1610 09/21/22 1143 10/18/22 1517   ITP Comments Virtual orientation call completed today. shehas an appointment on Date: 08/29/2022  for EP eval and gym Orientation.  Documentation of diagnosis can be found in [facility]  Date: 07/08/2022 .   Porter is a current tobacco user. Intervention for tobacco cessation was provided at the initial medical review. She was asked about readiness to quit and reported that she is down to 2 cigarettes a day from a pack. She is working towards cessation. No quit date gas been sedt.  . Patient was advised and educated about tobacco cessation using combination therapy, tobacco cessation classes, quit line, and quit smoking apps. Patient demonstrated understanding of this material. Staff will continue to provide encouragement and follow up with the patient throughout the  program. Completed and gym orientation. Initial ITP created and sent for review to Dr. Bethann Punches, Medical Director.Tekela is a current tobacco user. Intervention for tobacco cessation was provided at the initial medical review. She was asked about readiness to quit and reported she is down to 4 cigarettes a day and desires to quit but has not chosen a quit date yet . Patient was advised and educated about tobacco cessation using combination therapy, tobacco cessation classes, quit line, and quit smoking apps. Patient demonstrated understanding of this material. Staff will continue to provide encouragement and follow up with the patient throughout the program. First full day of exercise!  Patient was oriented to gym and equipment including functions, settings, policies, and procedures.  Patient's individual exercise prescription and treatment plan were reviewed.  All starting workloads were established  based on the results of the 6 minute walk test done at initial orientation visit.  The plan for exercise progression was also introduced and progression will be customized based on patient's performance and goals. 30 Day review completed. Medical Director ITP review done, changes made as directed, and signed approval by Medical Director.    new to program 30 Day review completed. Medical Director ITP review done, changes made as directed, and signed approval by Medical Director.    Row Name 11/16/22 1213 11/21/22 1647         ITP Comments 30 Day review completed. Medical Director ITP review done, changes made as directed, and signed approval by Medical Director. Telicia graduated today from  rehab with 36 sessions completed.  Details of the patient's exercise prescription and what She needs to do in order to continue the prescription and progress were discussed with patient.  Patient was given a copy of prescription and goals.  Patient verbalized understanding.  Adelyne plans to continue to exercise by  walking.               Comments: Discharge ITP

## 2022-11-21 NOTE — Progress Notes (Signed)
Discharge Summary   Tiffany Munoz  DOB: [birth year]  Tiffany Munoz graduated today from  rehab with 36 sessions completed.  Details of the patient's exercise prescription and what Tiffany Munoz needs to do in order to continue the prescription and progress were discussed with patient.  Patient was given a copy of prescription and goals.  Patient verbalized understanding.  Tiffany Munoz plans to continue to exercise by walking.   6 Minute Walk     Row Name 08/29/22 1723 11/09/22 1619       6 Minute Walk   Phase Initial Discharge    Distance 1000 feet 1110 feet    Distance % Change -- 11 %    Distance Feet Change -- 110 ft    Walk Time 6 minutes 6 minutes    # of Rest Breaks 0 0    MPH 1.89 2.1    METS 2.33 2.46    RPE 12 13    Perceived Dyspnea  1 1    VO2 Peak 8.16 8.61    Symptoms Yes (comment) Yes (comment)    Comments Munoz burning feeling during walk test PAD, Tiffany Munoz pain    Resting HR 68 bpm 67 bpm    Resting BP 122/70 128/60    Resting Oxygen Saturation  99 % 96 %    Exercise Oxygen Saturation  during 6 min walk 97 % 96 %    Max Ex. HR 77 bpm 72 bpm    Max Ex. BP 128/70 124/64    2 Minute Post BP 110/60 110/62

## 2023-03-14 NOTE — Progress Notes (Unsigned)
Cardiology Office Note    Date:  03/15/2023   ID:  Tiffany Munoz, DOB 1946-01-23, MRN [id number]  PCP:  Tiffany Baseman, MD  Cardiologist:  Debbe Odea, MD  Electrophysiologist:  None   Chief Complaint: Follow up  History of Present Illness:   Tiffany Munoz is a 77 y.o. 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   She was previously followed by Dr. Lady Gary.  Remote echo from [facility] in 2019 showed an EF of 65 to 70%, grade 1 diastolic dysfunction, degenerative mitral valve, moderate LVH, aortic sclerosis, and normal RV systolic function.  Nuclear stress testing in 2020 showed no evidence of ischemia or scar with an EF of 65%.     She was admitted to [facility] on 3/29 through 07/12/2022 chest pressure at rest that radiated to her back and felt similar to what she experienced when she was diagnosed with her aortic disease.  High-sensitivity troponin peaked at 3195.  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  Echo showed an EF of 55 to 60%, no regional wall motion abnormalities, mild LVH, grade 1 diastolic dysfunction, normal RV systolic function and ventricular cavity size, aortic valve sclerosis without evidence of stenosis, and an estimated right atrial pressure of 3 mmHg.  LHC showed the culprit vessel seemed to be the LCx with an occluded posterior AV groove branch with collaterals as well as significant distal and proximal stenosis along with an aneurysmal vessel especially in the midsegment.  The LAD had moderate disease that was not significant by FFR.  D2  had significant disease, though was small in size.  The rPDA also had significant mid stenosis, though was small in size.  She underwent successful PCI/DES to the distal and proximal LCx.  She was seen in hospital follow-up on 08/02/2022 and was without symptoms of angina or cardiac decompensation.  She reported a history of myalgias with possible myositis on daily statin therapy and was transitioned from rosuvastatin to Repatha.   Following her last visit, she contacted our office noting a black eye without trauma.  In this setting she was transitioned from ticagrelor to clopidogrel.  She was last seen in the office in 10/2022 and was without symptoms of angina or cardiac decompensation.  No changes in cardiac medications were indicated at that time.  With regards to her PAD, aortic ulcerative disease, and AAA, she is followed by [facility] vascular surgery.  CT chest, abdomen, pelvis in 11/2022 showed extensive mostly noncalcific atherosclerotic disease with multiple penetrating atherosclerotic ulcers of the aortic arch, descending thoracic aorta, and bilateral common iliac arteries.  Ascending aorta measuring 37 mm and stable and stable.  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  She comes in doing well from a cardiac perspective and is without symptoms  of angina or cardiac decompensation.  She reports an episode of her knee buckling while floating on 01/30/2023 with associated presyncope without frank syncope.  She was without symptoms of chest pain, dyspnea, or palpitations.  She followed up with her PCP the day after with recommendation to  hold amlodipine.  In the setting of elevated BPs following this carvedilol was increased to 6.25 mg twice daily.  Amlodipine was subsequently added back at 5 mg daily.  Blood pressures have consistently ranged in the 130s to 150s for the most part with a rare reading of 109 systolic.  No further dizzy/presyncopal episodes.  No falls, hematochezia, or melena.  Has graduated from cardiac rehab, really enjoyed her time there.  Does go to the gym a couple times per week.   Labs independently reviewed: 01/2023 - TSH normal, Hgb 14.3, PLT 251, potassium 4.3, BUN 28, serum creatinine 0.9, albumin 4.2, AST/ALT normal 10/2022 - TC 182, TG 133, HDL 61, LDL 94 07/2022 - A1c 5.1, LP(a) 415   Past Medical History:  Diagnosis Date   AAA (abdominal aortic aneurysm) (HCC)    Hypertension    Migraine     Past Surgical History:  Procedure Laterality Date   CESAREAN SECTION     CORONARY PRESSURE/FFR STUDY N/A 07/11/2022   Procedure: INTRAVASCULAR PRESSURE WIRE/FFR STUDY;  Surgeon: Iran Ouch, MD;  Location: [facility] INVASIVE CV LAB;  Service: Cardiovascular;  Laterality: N/A;   CORONARY STENT INTERVENTION N/A 07/11/2022   Procedure: CORONARY STENT INTERVENTION;  Surgeon: Iran Ouch, MD;  Location: [facility] INVASIVE CV LAB;  Service: Cardiovascular;  Laterality: N/A;   EXTERNAL EAR SURGERY     LEFT HEART CATH AND CORONARY ANGIOGRAPHY N/A 07/11/2022   Procedure: LEFT HEART CATH AND CORONARY ANGIOGRAPHY;  Surgeon: Iran Ouch, MD;  Location: [facility] INVASIVE CV LAB;  Service: Cardiovascular;  Laterality: N/A;   TYMPANOSTOMY TUBE PLACEMENT     UTERINE FIBROID SURGERY      Current Medications: Current Meds  Medication Sig   aspirin 81 MG tablet Take 1-2 tablets by mouth daily.   carvedilol (COREG) 3.125 MG tablet Take 6.25 mg by mouth 2 (two) times daily with a meal.   cholecalciferol (VITAMIN D3) 25 MCG (1000 UNIT) tablet Take 500 Units by mouth daily.   clobetasol cream (TEMOVATE) 0.05 % Apply 1  Application topically 3 (three) times daily as needed.   clopidogrel (PLAVIX) 75 MG tablet Take 1 tablet (75 mg total) by mouth daily.   Evolocumab (REPATHA SURECLICK) 140 MG/ML SOAJ ADMINISTER 1 ML UNDER THE SKIN EVERY 14 DAYS   Multiple Vitamin (MULTIVITAMIN) tablet Take 1 tablet by mouth daily.   potassium chloride (K-DUR,KLOR-CON) 10 MEQ tablet Take 1 tablet by mouth daily.   [DISCONTINUED] amLODipine (NORVASC) 5 MG tablet Take 1 tablet (5 mg total) by mouth daily.    Allergies:   Wellbutrin [bupropion]   Social History   Socioeconomic History   Marital status: Legally Separated    Spouse name: Not on file   Number of children: Not on file   Years of education: Not on file   Highest education level: Not on file  Occupational History   Not on file  Tobacco Use   Smoking status: Every Day    Current packs/day: 0.50    Average packs/day: 0.5 packs/day for 55.0 years (27.5 ttl pk-yrs)    Types: Cigarettes   Smokeless tobacco: Never   Tobacco comments:    5 cigarettes a day working toward cessation  Vaping Use   Vaping status: Never Used  Substance and Sexual Activity   Alcohol use: Yes    Alcohol/week: 2.0 standard drinks of alcohol    Types: 2 Cans of beer per week   Drug use: Never   Sexual activity: Not on file  Other Topics Concern   Not on file  Social History Narrative   Not on file   Social Determinants of Health   Financial Resource Strain: Patient Declined (11/08/2022)   Received from [facility]   Overall Financial Resource Strain (CARDIA)    Difficulty of Paying Living Expenses: Patient declined  Food Insecurity: Patient Declined (11/08/2022)   Received from [facility]   Hunger Vital Sign    Worried About Running Out of Food in the Last Year: Patient declined    Ran Out of Food in the Last Year: Patient declined  Transportation Needs: Patient Declined (11/08/2022)   Received from [facility]  - Transportation    In the past 12 months, has lack of transportation kept you from medical appointments or from getting medications?: Patient declined    Lack of Transportation (Non-Medical): Patient declined  Physical Activity: Unknown (02/24/2017)   Received from [facility], [facility]   Exercise Vital Sign    Days of Exercise per Week: Patient declined    Minutes of Exercise per Session: Patient declined  Stress: Unknown (02/24/2017)   Received from [facility], [facility]   [employer] of Occupational Health - Occupational Stress Questionnaire    Feeling of Stress : Patient declined  Social Connections: Unknown (02/24/2017)   Received from [facility], [facility]   Social Connection and Isolation Panel [NHANES]    Frequency of Communication with Friends and Family: Patient declined    Frequency of Social Gatherings with Friends and Family: Patient declined    Attends Religious Services: Patient declined    Active Member of Clubs or Organizations: Patient declined    Attends Engineer, structural: Patient declined    Marital Status: Patient declined     Family History:  The patient's family history includes Cancer in her brother; Gout in her brother; Heart disease in her father and mother; Hemochromatosis in her brother; Hypertension in her brother; Stroke in her sister.  ROS:   12-point review of systems is negative unless otherwise noted in the HPI.   EKGs/Labs/Other Studies Reviewed:    Studies reviewed were summarized above. The additional studies were reviewed today:  2D echo 07/09/2022: 1. Left ventricular ejection fraction, by estimation, is 55 to 60%. Left  ventricular ejection fraction by 2D MOD biplane is 66.1 %. The left  ventricle has normal function. The left ventricle has no regional wall  motion abnormalities. There is mild left   ventricular hypertrophy. Left ventricular diastolic parameters are  consistent with Grade I diastolic dysfunction (impaired relaxation).   2. Right ventricular systolic function is normal. The right ventricular  size is normal.   3. The mitral valve is normal in structure. No evidence of mitral valve  regurgitation.   4. The aortic valve is tricuspid. Aortic valve regurgitation is not  visualized. Aortic valve sclerosis is present, with no evidence of aortic  valve stenosis.   5. The inferior vena cava is normal in size with greater than 50%  respiratory variability, suggesting right atrial pressure of 3 mmHg.  __________   LHC  07/11/2022:   Prox LAD lesion is 30% stenosed.   Mid LAD lesion is 50% stenosed.   Mid Cx to Dist Cx lesion is 95% stenosed.   Prox Cx to Mid Cx lesion is 85% stenosed.   Prox RCA lesion is 30% stenosed.   Mid RCA lesion is 30% stenosed.   Dist RCA lesion is 60% stenosed.   RPDA lesion is 70% stenosed.   2nd Diag lesion is 80% stenosed.   LPAV lesion is 100% stenosed.   Mid Cx lesion is 30% stenosed.   A drug-eluting stent was successfully placed using a STENT ONYX FRONTIER 2.25X15.   A drug-eluting stent was successfully placed using a STENT ONYX FRONTIER 4.0X12.   Post intervention, there is a 0% residual stenosis.   Post intervention, there is a 0% residual stenosis.   The left ventricular systolic function is normal.   LV end diastolic pressure is normal.   The left ventricular ejection fraction is 55-65% by visual estimate.   1.  Non-ST elevation myocardial infarction.  The culprit seems to be the left circumflex with an occluded small posterior AV groove branch with collaterals, significant distal and proximal stenosis and an aneurysmal vessel especially in the midsegment.  The LAD has moderate disease that was not significant by fractional flow reserve evaluation.  The second diagonal has significant stenosis but small in size.  The right PDA also has  significant mid stenosis but small in size. 2.  Normal LV systolic function and normal left ventricular end-diastolic pressure. 3.  Successful angioplasty and drug-eluting stent placement to the distal and proximal left circumflex.   Recommendations: Dual antiplatelet therapy for 12 months. Aggressive treatment of risk factors. Treat small vessel disease medically.   EKG:  EKG is ordered today.  The EKG ordered today demonstrates NSR, 68 bpm, no acute ST-T changes  Recent Labs: 07/12/2022: BUN 15; Creatinine, Ser 0.73; Hemoglobin 13.5; Platelets 250; Potassium 3.7; Sodium 136  Recent Lipid Panel    Component Value Date/Time   CHOL 212 (H) 07/09/2022 0408   TRIG 83 07/09/2022 0408   HDL 67 07/09/2022 0408   CHOLHDL 3.2 07/09/2022 0408   VLDL 17 07/09/2022 0408   LDLCALC 128 (H) 07/09/2022 0408    PHYSICAL EXAM:    VS:  BP 120/80 (BP Location: Left Arm, Patient Position: Sitting, Cuff Size: Normal)   Pulse 68   Ht 5\' 5"  (1.651 m)   Wt 110 lb 6 oz (50.1 kg)   SpO2 97%   BMI 18.37 kg/m   BMI: Body mass index is 18.37 kg/m.  Physical Exam Vitals reviewed.  Constitutional:      Appearance: She is well-developed.  HENT:     Head: Normocephalic and atraumatic.  Eyes:     General:        Right eye: No discharge.        Left eye: No discharge.  Neck:     Vascular: No JVD.  Cardiovascular:     Rate and Rhythm: Normal rate and regular rhythm.     Heart sounds: Normal heart sounds, S1 normal and S2 normal. Heart sounds not distant. No midsystolic click and no opening snap. No murmur heard.    No friction rub.  Pulmonary:     Effort: Pulmonary effort is normal. No respiratory distress.     Breath sounds: Normal breath sounds. No decreased breath sounds, wheezing, rhonchi or rales.  Chest:     Chest wall: No tenderness.  Abdominal:  General: There is no distension.  Musculoskeletal:     Cervical back: Normal range of motion.     Right lower leg: No edema.     Left  lower leg: No edema.  Skin:    General: Skin is warm and dry.     Nails: There is no clubbing.  Neurological:     Mental Status: She is alert and oriented to person, place, and time.  Psychiatric:        Speech: Speech normal.        Behavior: Behavior normal.        Thought Content: Thought content normal.        Judgment: Judgment normal.     Wt Readings from Last 3 Encounters:  03/15/23 110 lb 6 oz (50.1 kg)  11/09/22 109 lb (49.4 kg)  11/09/22 109 lb 12.8 oz (49.8 kg)    Orthostatic vital signs: Lying: 127/78, 60 bpm Sitting: 130/77, 65 bpm Standing: 128/75, 69 bpm Standing time 3 minutes: 139/85, 72 bpm  ASSESSMENT & PLAN:   CAD involving the native coronary arteries with recent NSTEMI without angina: She continues to do well and is without symptoms concerning for angina or cardiac decompensation.  Continue DAPT with aspirin and clopidogrel for a minimum of 12 months without interruption from date of PCI (07/11/2022).  She has completed cardiac rehab.  Continue aggressive risk factor modification and secondary prevention including amlodipine, carvedilol, and Repatha.  No indication for further ischemic testing at this time.  PAD, penetrating aortic ulceration, thoracic aortic aneurysm, infrarenal AAA, stenosis: Hemodynamically stable.  Followed by [facility] vascular surgery.  She remains on aspirin and PCSK9 inhibitor.  HLD with statin intolerance: LDL 94 in 10/2022 with target LDL < 55.  Reports adherence to Repatha.  Labs were unable to be obtained today due to timing.  Anticipate follow-up labs at next visit.  HTN: Blood pressure is well-controlled in the office today.  She has had some labile readings, though they are largely remain elevated in the 130s to 150s at home.  Transition amlodipine to 2.5 mg twice daily, anticipate further titration based on home readings moving forward.  Will continue carvedilol 6.25 mg twice daily.     Disposition: F/u with Dr. Azucena Cecil or an APP  in late 07/2023.   Medication Adjustments/Labs and Tests Ordered: Current medicines are reviewed at length with the patient today.  Concerns regarding medicines are outlined above. Medication changes, Labs and Tests ordered today are summarized above and listed in the Patient Instructions accessible in Encounters.   Signed, Eula Listen, PA-C 03/15/2023 5:08 PM     [facility] [place] [address] [place], [place] [address] [phone]

## 2023-03-15 ENCOUNTER — Ambulatory Visit: Payer: Medicare Other | Attending: Physician Assistant | Admitting: Physician Assistant

## 2023-03-15 ENCOUNTER — Encounter: Payer: Self-pay | Admitting: Physician Assistant

## 2023-03-15 VITALS — BP 120/80 | HR 68 | Ht 65.0 in | Wt 110.4 lb

## 2023-03-15 DIAGNOSIS — E785 Hyperlipidemia, unspecified: Secondary | ICD-10-CM | POA: Diagnosis present

## 2023-03-15 DIAGNOSIS — I739 Peripheral vascular disease, unspecified: Secondary | ICD-10-CM

## 2023-03-15 DIAGNOSIS — I2510 Atherosclerotic heart disease of native coronary artery without angina pectoris: Secondary | ICD-10-CM | POA: Diagnosis present

## 2023-03-15 DIAGNOSIS — Z789 Other specified health status: Secondary | ICD-10-CM

## 2023-03-15 DIAGNOSIS — I214 Non-ST elevation (NSTEMI) myocardial infarction: Secondary | ICD-10-CM | POA: Diagnosis present

## 2023-03-15 DIAGNOSIS — I700 Atherosclerosis of aorta: Secondary | ICD-10-CM | POA: Diagnosis present

## 2023-03-15 DIAGNOSIS — I10 Essential (primary) hypertension: Secondary | ICD-10-CM

## 2023-03-15 DIAGNOSIS — I7143 Infrarenal abdominal aortic aneurysm, without rupture: Secondary | ICD-10-CM | POA: Diagnosis present

## 2023-03-15 DIAGNOSIS — I712 Thoracic aortic aneurysm, without rupture, unspecified: Secondary | ICD-10-CM

## 2023-03-15 DIAGNOSIS — I719 Aortic aneurysm of unspecified site, without rupture: Secondary | ICD-10-CM | POA: Diagnosis present

## 2023-03-15 MED ORDER — AMLODIPINE BESYLATE 5 MG PO TABS: 2.5000 mg | ORAL_TABLET | Freq: Every day | ORAL | 3 refills | Status: DC

## 2023-03-15 MED ORDER — AMLODIPINE BESYLATE 5 MG PO TABS: 2.5000 mg | ORAL_TABLET | Freq: Two times a day (BID) | ORAL | 3 refills | Status: DC

## 2023-03-15 NOTE — Patient Instructions (Addendum)
Medication Instructions:  Your physician recommends the following medication changes.  DECREASE: Amlodipine 2.5 mg twice daily   *If you need a refill on your cardiac medications before your next appointment, please call your pharmacy*   Lab Work: None ordered at this time    Follow-Up: At [facility], you and your health needs are our priority.  As part of our continuing mission to provide you with exceptional heart care, we have created designated Provider Care Teams.  These Care Teams include your primary Cardiologist (physician) and Advanced Practice Providers (APPs -  Physician Assistants and Nurse Practitioners) who all work together to provide you with the care you need, when you need it.  We recommend signing up for the patient portal called "MyChart".  Sign up information is provided on this After Visit Summary.  MyChart is used to connect with patients for Virtual Visits (Telemedicine).  Patients are able to view lab/test results, encounter notes, upcoming appointments, etc.  Non-urgent messages can be sent to your provider as well.   To learn more about what you can do with MyChart, go to [email or web address].    Your next appointment:   End of April (12 months after myocardial infarction to evaluate blood thinner removal)  Provider:   You may see Debbe Odea, MD or one of the following Advanced Practice Providers on your designated Care Team:   Eula Listen, [place]

## 2023-04-17 ENCOUNTER — Telehealth: Payer: Self-pay | Admitting: Cardiology

## 2023-04-17 NOTE — Telephone Encounter (Signed)
 Patient calling in to see if she can come in to have her bp check. Please advise

## 2023-04-17 NOTE — Telephone Encounter (Signed)
 Spoke with patient and inquired about her fall in more detail. She was in her kitchen making a sandwich and she felt like she was going to faint and then she fell in the floor while on the phone. She finished her phone conversation as she laid on the floor and then got up after that. Discussed blood pressures with provider and scheduled her to come in on Friday. She confirmed with no further questions at this time.

## 2023-04-17 NOTE — Telephone Encounter (Signed)
Agree with plans for office visit. 

## 2023-04-17 NOTE — Telephone Encounter (Signed)
 Spoke with patient and she states that her PCP wanted her to reach out to our office for a 24 hour blood pressure check to be done. Current blood pressure readings are as follows:    Current readings  12/28 had a fall 111/79 1/1 159/88 1/3 160/96 1/4 177/77 1/6 149/86  Advised that I would forward to provider for  review and any further recommendations.   Recommended that she take blood pressures 2 hours after medications and no more than 2 times per day.

## 2023-04-21 ENCOUNTER — Encounter: Payer: Self-pay | Admitting: Physician Assistant

## 2023-04-21 ENCOUNTER — Ambulatory Visit: Payer: Medicare Other | Attending: Physician Assistant | Admitting: Physician Assistant

## 2023-04-21 VITALS — BP 127/77 | HR 65 | Ht 65.0 in | Wt 108.0 lb

## 2023-04-21 DIAGNOSIS — Z789 Other specified health status: Secondary | ICD-10-CM | POA: Diagnosis present

## 2023-04-21 DIAGNOSIS — R0989 Other specified symptoms and signs involving the circulatory and respiratory systems: Secondary | ICD-10-CM | POA: Diagnosis not present

## 2023-04-21 DIAGNOSIS — I2510 Atherosclerotic heart disease of native coronary artery without angina pectoris: Secondary | ICD-10-CM

## 2023-04-21 DIAGNOSIS — I700 Atherosclerosis of aorta: Secondary | ICD-10-CM | POA: Diagnosis present

## 2023-04-21 DIAGNOSIS — I7143 Infrarenal abdominal aortic aneurysm, without rupture: Secondary | ICD-10-CM

## 2023-04-21 DIAGNOSIS — E785 Hyperlipidemia, unspecified: Secondary | ICD-10-CM | POA: Diagnosis not present

## 2023-04-21 DIAGNOSIS — I739 Peripheral vascular disease, unspecified: Secondary | ICD-10-CM | POA: Diagnosis present

## 2023-04-21 DIAGNOSIS — I712 Thoracic aortic aneurysm, without rupture, unspecified: Secondary | ICD-10-CM

## 2023-04-21 DIAGNOSIS — I719 Aortic aneurysm of unspecified site, without rupture: Secondary | ICD-10-CM

## 2023-04-21 NOTE — Patient Instructions (Signed)
 Medication Instructions:  Your Physician recommend you continue on your current medication as directed.    *If you need a refill on your cardiac medications before your next appointment, please call your pharmacy*   Lab Work: None ordered at this time  If you have labs (blood work) drawn today and your tests are completely normal, you will receive your results only by: MyChart Message (if you have MyChart) OR A paper copy in the mail If you have any lab test that is abnormal or we need to change your treatment, we will call you to review the results.   Testing/Procedures: 24 hour blood pressure monitor has been ordered for you.    You may pick it up at: [address], [place], [place] [address]   They will call you to set up an appointment and show you how to use it.  Please call our office at [phone].     Follow-Up: At [facility], you and your health needs are our priority.  As part of our continuing mission to provide you with exceptional heart care, we have created designated Provider Care Teams.  These Care Teams include your primary Cardiologist (physician) and Advanced Practice Providers (APPs -  Physician Assistants and Nurse Practitioners) who all work together to provide you with the care you need, when you need it.  We recommend signing up for the patient portal called MyChart.  Sign up information is provided on this After Visit Summary.  MyChart is used to connect with patients for Virtual Visits (Telemedicine).  Patients are able to view lab/test results, encounter notes, upcoming appointments, etc.  Non-urgent messages can be sent to your provider as well.   To learn more about what you can do with MyChart, go to [email or web address].    Your next appointment:   3 month(s)  Provider:   You may see Redell Cave, MD or one of the following Advanced Practice Providers on your designated Care Team:   Lonni Meager, NP Bernardino Bring,  PA-C Cadence Franchester, PA-C Tylene Lunch, NP Barnie Hila, NP

## 2023-04-21 NOTE — Progress Notes (Signed)
 Cardiology Office Note    Date:  04/21/2023   ID:  Tiffany Munoz, DOB 1945/08/23, MRN [id number]  PCP:  Glover Lenis, MD  Cardiologist:  Redell Cave, MD  Electrophysiologist:  None   Chief Complaint: Labile blood pressure  History of Present Illness:   Tiffany Munoz is a 78 y.o. 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   She was previously followed by Dr. Bosie.  Remote echo from [facility] in 2019 showed an EF of 65 to 70%, grade 1 diastolic dysfunction, degenerative mitral valve, moderate LVH, aortic sclerosis, and normal RV systolic function.  Nuclear stress testing in 2020 showed no evidence of ischemia or scar with an EF of 65%.     She was admitted to [facility] on 3/29 through 07/12/2022 chest pressure at rest that radiated to her back and felt similar to what she experienced when she was diagnosed with her aortic disease.  High-sensitivity troponin peaked at 3195.  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  Echo showed an EF of 55 to 60%, no regional wall motion abnormalities, mild LVH, grade 1 diastolic dysfunction, normal RV systolic function and ventricular cavity size, aortic valve sclerosis without evidence of stenosis, and an estimated right atrial pressure of 3 mmHg.  LHC showed the culprit vessel seemed to be the LCx with an occluded posterior AV groove branch with collaterals as well as significant distal and proximal stenosis along with an aneurysmal vessel especially in the midsegment.  The LAD had moderate disease that  was not significant by FFR.  D2 had significant disease, though was small in size.  The rPDA also had significant mid stenosis, though was small in size.  She underwent successful PCI/DES to the distal and proximal LCx.  She was seen in hospital follow-up on 08/02/2022 and was without symptoms of angina or cardiac decompensation.  She reported a history of myalgias with possible myositis on daily statin therapy and was transitioned from rosuvastatin  to Repatha .  She was subsequently transitioned from ticagrelor  to clopidogrel  in the setting of noting a black eye without trauma.  She was last seen in our office on 03/15/2023 and noted an episode of presyncope without frank syncope a couple of months prior.  She was evaluated by her PCP with recommendation to hold amlodipine .  Following this, in the setting of elevated BPs, her carvedilol  was titrated to 6.25 mg twice daily.  Amlodipine  was subsequently added back at 5 mg daily.  Home blood pressures were typically in the 130s to 150s systolic with a rare reading in the low 100s systolic.  It was recommended she transition amlodipine  to 2.5 mg twice daily with continuation of carvedilol  6.25 mg twice daily.   With regards to her PAD, aortic ulcerative disease, and AAA, she is followed by [facility] vascular surgery.  CT chest, abdomen, pelvis in 11/2022 showed extensive mostly noncalcific atherosclerotic disease with multiple penetrating atherosclerotic ulcers of the aortic arch, descending thoracic aorta, and bilateral common iliac arteries.  Ascending aorta measuring 37 mm and stable and stable.  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  She contacted our office earlier this month noting an episode where her knees buckled and she fell to the floor on 12/28.  She did not hit her head or suffer LOC.  She stayed on the floor to continue her telephone conversation for several minutes.  Following this, she crawled to the couch and was able to get up and check her blood pressure.  At that time, blood pressure was 111/79.  She had not yet taken her morning amlodipine  or carvedilol .  She was without symptoms of dizziness or presyncope with this episode.  No frank syncope.  No chest pain, dyspnea, or palpitations.  She has not had any episodes since.  She also reports a prior episode of generalized weakness with associated dizziness while early floating.  She was able to slide to the floor.  She did not suffer LOC during that episode.  Review of patient's BP log shows BP readings are largely in the 130s to 160s systolic with an occasional 170 systolic.  She did have a couple isolated readings in the low 100s and 1 reading in the 90s systolic.  She was asymptomatic with these lower readings.  No symptoms concerning for bleeding.  She reports continually sipping on a citrus green tea throughout the day.  PCP has suggested 24-hour ambulatory BP monitor.   Labs independently reviewed: 01/2023 - TSH normal, Hgb 14.3, PLT 251, potassium 4.3, BUN 28, serum creatinine 0.9, albumin 4.2, AST/ALT normal 10/2022 - TC 182, TG 133, HDL 61, LDL 94 07/2022 - A1c 5.1, LP(a) 415  Past Medical History:  Diagnosis Date   AAA (abdominal aortic aneurysm) (HCC)    CAD (coronary artery disease)    Emphysema of lung (HCC)    HLD (hyperlipidemia)    Hypertension    Migraine     Past Surgical History:  Procedure Laterality Date   CESAREAN SECTION     CORONARY PRESSURE/FFR STUDY N/A 07/11/2022   Procedure: INTRAVASCULAR PRESSURE WIRE/FFR STUDY;  Surgeon: Darron Deatrice LABOR, MD;  Location: [facility] INVASIVE CV LAB;  Service:  Cardiovascular;  Laterality: N/A;   CORONARY STENT INTERVENTION N/A 07/11/2022   Procedure: CORONARY STENT INTERVENTION;  Surgeon: Darron Deatrice LABOR, MD;  Location: [facility] INVASIVE CV LAB;  Service: Cardiovascular;  Laterality: N/A;   EXTERNAL EAR SURGERY     LEFT HEART CATH AND CORONARY ANGIOGRAPHY N/A 07/11/2022   Procedure: LEFT HEART CATH AND CORONARY ANGIOGRAPHY;  Surgeon: Darron Deatrice LABOR, MD;  Location: [facility] INVASIVE CV LAB;  Service: Cardiovascular;  Laterality: N/A;   TYMPANOSTOMY TUBE PLACEMENT     UTERINE FIBROID SURGERY      Current Medications: Current Meds  Medication Sig   amLODipine  (NORVASC ) 5 MG tablet Take 0.5 tablets (2.5 mg total) by mouth in the morning and at bedtime.   aspirin  81 MG tablet Take 2 tablets by mouth daily.   carvedilol  (COREG ) 6.25 MG tablet Take by mouth.   cholecalciferol (VITAMIN D3) 25 MCG (1000 UNIT) tablet Take 500 Units by mouth daily.   clobetasol cream (TEMOVATE) 0.05 % Apply 1 Application topically 3 (three) times daily as needed.   clopidogrel  (PLAVIX ) 75 MG tablet Take 1 tablet (  75 mg total) by mouth daily.   Evolocumab  (REPATHA  SURECLICK) 140 MG/ML SOAJ ADMINISTER 1 ML UNDER THE SKIN EVERY 14 DAYS   Multiple Vitamin (MULTIVITAMIN) tablet Take 1 tablet by mouth daily.   potassium chloride  (K-DUR,KLOR-CON ) 10 MEQ tablet Take 1 tablet by mouth daily.   [DISCONTINUED] carvedilol  (COREG ) 3.125 MG tablet Take 6.25 mg by mouth 2 (two) times daily with a meal.    Allergies:   Wellbutrin [bupropion]   Social History   Socioeconomic History   Marital status: Legally Separated    Spouse name: Not on file   Number of children: Not on file   Years of education: Not on file   Highest education level: Not on file  Occupational History   Not on file  Tobacco Use   Smoking status: Every Day    Current packs/day: 0.50    Average packs/day: 0.5 packs/day for 55.0 years (27.5 ttl pk-yrs)    Types: Cigarettes   Smokeless tobacco: Never   Tobacco  comments:    5 cigarettes a day working toward cessation  Vaping Use   Vaping status: Never Used  Substance and Sexual Activity   Alcohol use: Yes    Alcohol/week: 2.0 standard drinks of alcohol    Types: 2 Cans of beer per week   Drug use: Never   Sexual activity: Not on file  Other Topics Concern   Not on file  Social History Narrative   Not on file   Social Drivers of Health   Financial Resource Strain: Patient Declined (11/08/2022)   Received from [facility]   Overall Financial Resource Strain (CARDIA)    Difficulty of Paying Living Expenses: Patient declined  Food Insecurity: Patient Declined (11/08/2022)   Received from [facility]   Hunger Vital Sign    Worried About Running Out of Food in the Last Year: Patient declined    Ran Out of Food in the Last Year: Patient declined  Transportation Needs: Patient Declined (11/08/2022)   Received from [facility] - Transportation    In the past 12 months, has lack of transportation kept you from medical appointments or from getting medications?: Patient declined    Lack of Transportation (Non-Medical): Patient declined  Physical Activity: Unknown (02/24/2017)   Received from [facility], [facility]   Exercise Vital Sign    Days of Exercise per Week: Patient declined    Minutes of Exercise per Session: Patient declined  Stress: Unknown (02/24/2017)   Received from [facility], [facility]   [employer] [facility] [employer] [facility] - Occupational Stress Questionnaire    Feeling of Stress : Patient declined  Social Connections: Unknown (02/24/2017)   Received from [facility], [facility]   Social Connection and Isolation Panel [NHANES]    Frequency of Communication with Friends and Family: Patient declined    Frequency of Social Gatherings with Friends and  Family: Patient declined    Attends Religious Services: Patient declined    Active Member of Clubs or Organizations: Patient declined    Attends Engineer, Structural: Patient declined    Marital Status: Patient declined     Family History:  The patient's family history includes Cancer in her brother; Gout in her brother; Heart disease in her father and mother; Hemochromatosis in her brother; Hypertension in her brother; Stroke in her sister.  ROS:   12-point review of  systems is negative unless otherwise noted in the HPI.   EKGs/Labs/Other Studies Reviewed:    Studies reviewed were summarized above. The additional studies were reviewed today:  2D echo 07/09/2022: 1. Left ventricular ejection fraction, by estimation, is 55 to 60%. Left  ventricular ejection fraction by 2D MOD biplane is 66.1 %. The left  ventricle has normal function. The left ventricle has no regional wall  motion abnormalities. There is mild left  ventricular hypertrophy. Left ventricular diastolic parameters are  consistent with Grade I diastolic dysfunction (impaired relaxation).   2. Right ventricular systolic function is normal. The right ventricular  size is normal.   3. The mitral valve is normal in structure. No evidence of mitral valve  regurgitation.   4. The aortic valve is tricuspid. Aortic valve regurgitation is not  visualized. Aortic valve sclerosis is present, with no evidence of aortic  valve stenosis.   5. The inferior vena cava is normal in size with greater than 50%  respiratory variability, suggesting right atrial pressure of 3 mmHg.  __________   LHC 07/11/2022:   Prox LAD lesion is 30% stenosed.   Mid LAD lesion is 50% stenosed.   Mid Cx to Dist Cx lesion is 95% stenosed.   Prox Cx to Mid Cx lesion is 85% stenosed.   Prox RCA lesion is 30% stenosed.   Mid RCA lesion is 30% stenosed.   Dist RCA lesion is 60% stenosed.   RPDA lesion is 70% stenosed.   2nd Diag lesion is 80%  stenosed.   LPAV lesion is 100% stenosed.   Mid Cx lesion is 30% stenosed.   A drug-eluting stent was successfully placed using a STENT ONYX FRONTIER 2.25X15.   A drug-eluting stent was successfully placed using a STENT ONYX FRONTIER 4.0X12.   Post intervention, there is a 0% residual stenosis.   Post intervention, there is a 0% residual stenosis.   The left ventricular systolic function is normal.   LV end diastolic pressure is normal.   The left ventricular ejection fraction is 55-65% by visual estimate.   1.  Non-ST elevation myocardial infarction.  The culprit seems to be the left circumflex with an occluded small posterior AV groove branch with collaterals, significant distal and proximal stenosis and an aneurysmal vessel especially in the midsegment.  The LAD has moderate disease that was not significant by fractional flow reserve evaluation.  The second diagonal has significant stenosis but small in size.  The right PDA also has significant mid stenosis but small in size. 2.  Normal LV systolic function and normal left ventricular end-diastolic pressure. 3.  Successful angioplasty and drug-eluting stent placement to the distal and proximal left circumflex.   Recommendations: Dual antiplatelet therapy for 12 months. Aggressive treatment of risk factors. Treat small vessel disease medically.   EKG:  EKG is ordered today.  The EKG ordered today demonstrates NSR, 65 bpm, baseline artifact and wandering, no acute ST-T changes  Recent Labs: 07/12/2022: BUN 15; Creatinine, Ser 0.73; Hemoglobin 13.5; Platelets 250; Potassium 3.7; Sodium 136  Recent Lipid Panel    Component Value Date/Time   CHOL 212 (H) 07/09/2022 0408   TRIG 83 07/09/2022 0408   HDL 67 07/09/2022 0408   CHOLHDL 3.2 07/09/2022 0408   VLDL 17 07/09/2022 0408   LDLCALC 128 (H) 07/09/2022 0408    PHYSICAL EXAM:    VS:  BP 127/77 (BP Location: Left Arm, Patient Position: Sitting, Cuff Size: Normal)   Pulse 65   Ht 5'  5 (1.651 m)   Wt 108 lb (49 kg)   SpO2 98%   BMI 17.97 kg/m   BMI: Body mass index is 17.97 kg/m.  Physical Exam Vitals reviewed.  Constitutional:      Appearance: She is well-developed.  HENT:     Head: Normocephalic and atraumatic.  Eyes:     General:        Right eye: No discharge.        Left eye: No discharge.  Neck:     Vascular: No JVD.  Cardiovascular:     Rate and Rhythm: Normal rate and regular rhythm.     Heart sounds: Normal heart sounds, S1 normal and S2 normal. Heart sounds not distant. No midsystolic click and no opening snap. No murmur heard.    No friction rub.  Pulmonary:     Effort: Pulmonary effort is normal. No respiratory distress.     Breath sounds: Normal breath sounds. No decreased breath sounds, wheezing, rhonchi or rales.  Chest:     Chest wall: No tenderness.  Abdominal:     General: There is no distension.  Musculoskeletal:     Cervical back: Normal range of motion.     Right lower leg: No edema.     Left lower leg: No edema.  Skin:    General: Skin is warm and dry.     Nails: There is no clubbing.  Neurological:     Mental Status: She is alert and oriented to person, place, and time.  Psychiatric:        Speech: Speech normal.        Behavior: Behavior normal.        Thought Content: Thought content normal.        Judgment: Judgment normal.     Wt Readings from Last 3 Encounters:  04/21/23 108 lb (49 kg)  03/15/23 110 lb 6 oz (50.1 kg)  11/09/22 109 lb (49.4 kg)   Orthostatic vitals: Lying: 148/80, 66 bpm Sitting: 149/72, 62 bpm Standing: 129/83, 69 bpm Standing x 3 minutes: 150/84, 65 bpm  ASSESSMENT & PLAN:   Labile HTN: Blood pressure is well-controlled in office today, though she was noted to have positive orthostatic change when transitioning from sitting to standing with a blood pressure drop from 149 systolic to 129 systolic.  BP readings at home are largely well-controlled mildly elevated with an occasional low  reading.  Unclear etiology of her 2 episodes of lower extremity weakness over the past couple of months.  Episodes were not associated with symptoms concerning for angina or palpitations.  No frank syncope.  In this setting, outpatient cardiac monitoring is likely of low yield at this time.  We will place a 24-hour ambulatory BP monitor for further evaluation and to assist with providing recommendations regarding BP medications.  For now, continue carvedilol  6.25 mg twice daily and amlodipine  2.5 mg twice daily.  Maintain adequate hydration.  CAD involving the native coronary arteries with recent NSTEMI without angina: She continues to do well and is without symptoms of angina or cardiac decompensation.  Continue DAPT with aspirin  and clopidogrel  for a minimum of 12 months without interruption from date of PCI (07/11/2022).  Continue aggressive risk factor modification and secondary prevention including amlodipine , carvedilol , and Repatha .  She has completed cardiac rehab.  No indication for further ischemic testing at this time.  HLD with statin intolerance: LDL 94 in 10/2022 with target LDL less than 55.  Remains on Repatha .  Anticipate follow-up  labs at next visit.  PAD, penetrating aortic ulceration, thoracic aortic aneurysm, infrarenal AAA: Followed by [facility] vascular surgery.  She remains on aspirin  and PCSK9 inhibitor.     Disposition: F/u with Dr. Darliss or an APP in 3 months.   Medication Adjustments/Labs and Tests Ordered: Current medicines are reviewed at length with the patient today.  Concerns regarding medicines are outlined above. Medication changes, Labs and Tests ordered today are summarized above and listed in the Patient Instructions accessible in Encounters.   Signed, Bernardino Bring, PA-C 04/21/2023 12:24 PM     [facility] [place] [address] [place], [place] [address] [phone]

## 2023-05-18 ENCOUNTER — Other Ambulatory Visit: Payer: Self-pay | Admitting: Physician Assistant

## 2023-05-18 DIAGNOSIS — I719 Aortic aneurysm of unspecified site, without rupture: Secondary | ICD-10-CM

## 2023-05-18 DIAGNOSIS — I2510 Atherosclerotic heart disease of native coronary artery without angina pectoris: Secondary | ICD-10-CM

## 2023-05-18 DIAGNOSIS — I712 Thoracic aortic aneurysm, without rupture, unspecified: Secondary | ICD-10-CM

## 2023-05-18 DIAGNOSIS — R0989 Other specified symptoms and signs involving the circulatory and respiratory systems: Secondary | ICD-10-CM

## 2023-05-18 DIAGNOSIS — I739 Peripheral vascular disease, unspecified: Secondary | ICD-10-CM

## 2023-05-18 DIAGNOSIS — R030 Elevated blood-pressure reading, without diagnosis of hypertension: Secondary | ICD-10-CM

## 2023-05-18 DIAGNOSIS — E785 Hyperlipidemia, unspecified: Secondary | ICD-10-CM

## 2023-05-18 DIAGNOSIS — I7143 Infrarenal abdominal aortic aneurysm, without rupture: Secondary | ICD-10-CM

## 2023-05-18 DIAGNOSIS — Z789 Other specified health status: Secondary | ICD-10-CM

## 2023-05-23 ENCOUNTER — Ambulatory Visit: Payer: Medicare Other | Attending: Physician Assistant

## 2023-05-23 DIAGNOSIS — R0989 Other specified symptoms and signs involving the circulatory and respiratory systems: Secondary | ICD-10-CM | POA: Diagnosis not present

## 2023-05-23 DIAGNOSIS — I7143 Infrarenal abdominal aortic aneurysm, without rupture: Secondary | ICD-10-CM | POA: Diagnosis not present

## 2023-05-23 DIAGNOSIS — R030 Elevated blood-pressure reading, without diagnosis of hypertension: Secondary | ICD-10-CM | POA: Diagnosis not present

## 2023-05-23 DIAGNOSIS — I712 Thoracic aortic aneurysm, without rupture, unspecified: Secondary | ICD-10-CM | POA: Diagnosis not present

## 2023-05-23 NOTE — Progress Notes (Unsigned)
24 hour ambulatory blood pressure monitor applied to patients left arm using standard adult cuff.  Dr. Azucena Cecil to read.

## 2023-05-25 ENCOUNTER — Telehealth: Payer: Self-pay | Admitting: Cardiology

## 2023-05-25 NOTE — Telephone Encounter (Signed)
*  STAT* If patient is at the pharmacy, call can be transferred to refill team.   1. Which medications need to be refilled? (please list name of each medication and dose if known) amLODipine (NORVASC) 5 MG tablet   2. Which pharmacy/location (including street and city if local pharmacy) is medication to be sent to?  [id number] - [place], [place] - [address] OF [address]    3. Do they need a 30 day or 90 day supply?  90

## 2023-05-26 MED ORDER — AMLODIPINE BESYLATE 5 MG PO TABS: 2.5000 mg | ORAL_TABLET | Freq: Two times a day (BID) | ORAL | 3 refills | Status: AC

## 2023-05-26 NOTE — Telephone Encounter (Signed)
Pt's medication was sent to pt's pharmacy as requested. Confirmation received.

## 2023-07-31 NOTE — Progress Notes (Signed)
 Cardiology Office Note    Date:  08/01/2023   ID:  Tikisha Molinaro, DOB 1945-06-18, MRN [id number]  PCP:  Rory Collard, MD  Cardiologist:  Constancia Delton, MD  Electrophysiologist:  None   Chief Complaint: Follow-up  History of Present Illness:   Tiffany Munoz is a 78 y.o. 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   She was previously followed by Dr. Cara Chancellor.  Remote echo from [facility] in 2019 showed an EF of 65 to 70%, grade 1 diastolic dysfunction, degenerative mitral valve, moderate LVH, aortic sclerosis, and normal RV systolic function.  Nuclear stress testing in 2020 showed no evidence of ischemia or scar with an EF of 65%.     She was admitted to [facility] on 3/29 through 07/12/2022 chest pressure at rest that radiated to her back and felt similar to what she experienced when she was diagnosed with her aortic disease.  High-sensitivity troponin peaked at 3195.  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  Echo showed an EF of 55 to 60%, no regional wall motion abnormalities, mild LVH, grade 1 diastolic dysfunction, normal RV systolic function and ventricular cavity size, aortic valve sclerosis without evidence of stenosis, and an estimated right atrial pressure of 3 mmHg.  LHC showed the culprit vessel seemed to be the LCx with an occluded posterior AV groove branch with collaterals as well as significant distal and proximal stenosis along with an aneurysmal vessel especially in the midsegment.  The LAD had moderate disease that was not  significant by FFR.  D2 had significant disease, though was small in size.  The rPDA also had significant mid stenosis, though was small in size.  She underwent successful PCI/DES to the distal and proximal LCx.  She was seen in hospital follow-up on 08/02/2022 and was without symptoms of angina or cardiac decompensation.  She reported a history of myalgias with possible myositis on daily statin therapy and was transitioned from rosuvastatin  to Repatha .  She was subsequently transitioned from ticagrelor  to clopidogrel  in the setting of noting a black eye without trauma.   She was seen in our office on 03/15/2023 and noted an episode of presyncope without frank syncope a couple of months prior.  She was evaluated by her PCP with recommendation to hold amlodipine .  Following this, in the setting of elevated BPs, her carvedilol  was titrated to 6.25 mg twice daily.  Amlodipine  was subsequently added back at 5 mg daily.  Home blood pressures were typically in the 130s to 150s systolic with a rare reading in the low 100s systolic.  It was recommended she transition amlodipine  to 2.5 mg twice daily with continuation of carvedilol  6.25 mg twice daily.  She was most recently seen in our office in 04/2023 reporting an episode where her knees buckled and she fell on the floor in late December 2024.  She did not hit her head or suffer LOC.  She stayed on the floor and continue to her telephone conversation for several minutes.  Following this, she checked her blood pressure and found this to be  111/79.  She was without symptoms of angina or cardiac decompensation.  No frank syncope.  She had been asymptomatic since.  24-hour ambulatory BP monitor showed an average blood pressure 136/70 with an average heart rate of 63 bpm.  Overall, blood pressure was well-controlled with a rare isolated elevated or low blood pressure event with low blood pressure of 99 mmHg systolic occurring at 9:39 PM.  With regards to her PAD, aortic  ulcerative disease, and AAA, she is followed by [facility] vascular surgery.  CT chest, abdomen, pelvis in 05/2023 showed extensive mostly noncalcific atherosclerotic disease with multiple penetrating atherosclerotic ulcers of the aortic arch, descending thoracic aorta, and bilateral common iliac arteries.  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  She comes in doing well from a cardiac perspective and is without symptoms of angina or cardiac decompensation.  No dizziness, presyncope, or syncope.  No falls, hematochezia, or melena.  Appetite remains good.  No lower extremity swelling or progressive orthopnea.  Has now completed 12 months of DAPT from date of MI.  Continues to smoke less than 1/2 pack daily.  Was previously able to quit for 20+ years though due to significant stressor resumed.  Not yet ready to quit.  Overall feels like she is doing well from a cardiac perspective.   Labs independently reviewed: 01/2023 - TSH normal, Hgb 14.3, PLT 251, potassium 4.3, BUN 28, serum creatinine 0.9, albumin 4.2, AST/ALT normal 10/2022 - TC 182, TG 133, HDL 61, LDL 94 07/2022 - A1c 5.1, LP(a) 415  Past Medical History:  Diagnosis Date   AAA (abdominal aortic aneurysm) (HCC)    CAD (coronary artery disease)    Emphysema of lung (HCC)    HLD (hyperlipidemia)    Hypertension    Migraine     Past Surgical History:  Procedure Laterality Date   CESAREAN SECTION     CORONARY PRESSURE/FFR STUDY N/A 07/11/2022   Procedure: INTRAVASCULAR PRESSURE WIRE/FFR STUDY;  Surgeon: Wenona Hamilton, MD;   Location: [facility] INVASIVE CV LAB;  Service: Cardiovascular;  Laterality: N/A;   CORONARY STENT INTERVENTION N/A 07/11/2022   Procedure: CORONARY STENT INTERVENTION;  Surgeon: Wenona Hamilton, MD;  Location: [facility] INVASIVE CV LAB;  Service: Cardiovascular;  Laterality: N/A;   EXTERNAL EAR SURGERY     LEFT HEART CATH AND CORONARY ANGIOGRAPHY N/A 07/11/2022   Procedure: LEFT HEART CATH AND CORONARY ANGIOGRAPHY;  Surgeon: Wenona Hamilton, MD;  Location: [facility] INVASIVE CV LAB;  Service: Cardiovascular;  Laterality: N/A;   TYMPANOSTOMY TUBE PLACEMENT     UTERINE FIBROID SURGERY      Current Medications: Current Meds  Medication Sig   amLODipine  (NORVASC ) 5 MG tablet Take 0.5 tablets (2.5 mg total) by mouth in the morning and at bedtime.   aspirin  81 MG tablet Take 2 tablets by mouth daily.   carvedilol  (COREG ) 6.25 MG tablet Take by mouth.   cholecalciferol (VITAMIN D3) 25 MCG (1000 UNIT) tablet Take 500 Units by mouth daily.   clobetasol cream (TEMOVATE) 0.05 % Apply 1 Application topically 3 (three) times daily as needed.   Evolocumab  (REPATHA   SURECLICK) 140 MG/ML SOAJ ADMINISTER 1 ML UNDER THE SKIN EVERY 14 DAYS   Multiple Vitamin (MULTIVITAMIN) tablet Take 1 tablet by mouth daily.   nitroGLYCERIN  (NITROSTAT ) 0.4 MG SL tablet Place 1 tablet (0.4 mg total) under the tongue every 5 (five) minutes x 3 doses as needed for chest pain.   potassium chloride  (MICRO-K ) 10 MEQ CR capsule Take 10 mEq by mouth daily.   [DISCONTINUED] clopidogrel  (PLAVIX ) 75 MG tablet Take 1 tablet (75 mg total) by mouth daily.    Allergies:   Wellbutrin [bupropion]   Social History   Socioeconomic History   Marital status: Legally Separated    Spouse name: Not on file   Number of children: 1   Years of education: Not on file   Highest education level: Not on file  Occupational History   Not on file  Tobacco Use   Smoking status: Every Day    Current packs/day: 0.50    Average packs/day: 0.5 packs/day for 55.0  years (27.5 ttl pk-yrs)    Types: Cigarettes   Smokeless tobacco: Never   Tobacco comments:    5 cigarettes a day working toward cessation  Vaping Use   Vaping status: Never Used  Substance and Sexual Activity   Alcohol use: Yes    Alcohol/week: 2.0 standard drinks of alcohol    Types: 2 Cans of beer per week   Drug use: Never   Sexual activity: Not on file  Other Topics Concern   Not on file  Social History Narrative   Not on file   Social Drivers of Health   Financial Resource Strain: Patient Declined (11/08/2022)   Received from [facility]   Overall Financial Resource Strain (CARDIA)    Difficulty of Paying Living Expenses: Patient declined  Food Insecurity: Patient Declined (11/08/2022)   Received from [facility]   Hunger Vital Sign    Worried About Running Out of Food in the Last Year: Patient declined    Ran Out of Food in the Last Year: Patient declined  Transportation Needs: Patient Declined (11/08/2022)   Received from [facility] - Transportation    In the past 12 months, has lack of transportation kept you from medical appointments or from getting medications?: Patient declined    Lack of Transportation (Non-Medical): Patient declined  Physical Activity: Unknown (02/24/2017)   Received from [facility], [facility]   Exercise Vital Sign    Days of Exercise per Week: Patient declined    Minutes of Exercise per Session: Patient declined  Stress: Unknown (02/24/2017)   Received from [facility], [facility]   [employer] of Occupational Health - Occupational Stress Questionnaire    Feeling of Stress : Patient declined  Social Connections: Unknown (02/24/2017)   Received from [facility], [facility]   Social Connection and Isolation Panel [NHANES]    Frequency of Communication with Friends  and Family: Patient declined    Frequency of Social Gatherings with Friends and Family: Patient declined    Attends Religious Services: Patient declined    Active Member of Clubs or Organizations: Patient declined    Attends Engineer, structural: Patient declined    Marital Status: Patient declined     Family History:  The patient's family history includes Cancer in her brother; Gout in her brother; Heart disease in her father and mother; Hemochromatosis in her brother;  Hypertension in her brother; Stroke in her sister.  ROS:   12-point review of systems is negative unless otherwise noted in the HPI.   EKGs/Labs/Other Studies Reviewed:    Studies reviewed were summarized above. The additional studies were reviewed today:  2D echo 07/09/2022: 1. Left ventricular ejection fraction, by estimation, is 55 to 60%. Left  ventricular ejection fraction by 2D MOD biplane is 66.1 %. The left  ventricle has normal function. The left ventricle has no regional wall  motion abnormalities. There is mild left  ventricular hypertrophy. Left ventricular diastolic parameters are  consistent with Grade I diastolic dysfunction (impaired relaxation).   2. Right ventricular systolic function is normal. The right ventricular  size is normal.   3. The mitral valve is normal in structure. No evidence of mitral valve  regurgitation.   4. The aortic valve is tricuspid. Aortic valve regurgitation is not  visualized. Aortic valve sclerosis is present, with no evidence of aortic  valve stenosis.   5. The inferior vena cava is normal in size with greater than 50%  respiratory variability, suggesting right atrial pressure of 3 mmHg.  __________   LHC 07/11/2022:   Prox LAD lesion is 30% stenosed.   Mid LAD lesion is 50% stenosed.   Mid Cx to Dist Cx lesion is 95% stenosed.   Prox Cx to Mid Cx lesion is 85% stenosed.   Prox RCA lesion is 30% stenosed.   Mid RCA lesion is 30% stenosed.   Dist RCA  lesion is 60% stenosed.   RPDA lesion is 70% stenosed.   2nd Diag lesion is 80% stenosed.   LPAV lesion is 100% stenosed.   Mid Cx lesion is 30% stenosed.   A drug-eluting stent was successfully placed using a STENT ONYX FRONTIER 2.25X15.   A drug-eluting stent was successfully placed using a STENT ONYX FRONTIER 4.0X12.   Post intervention, there is a 0% residual stenosis.   Post intervention, there is a 0% residual stenosis.   The left ventricular systolic function is normal.   LV end diastolic pressure is normal.   The left ventricular ejection fraction is 55-65% by visual estimate.   1.  Non-ST elevation myocardial infarction.  The culprit seems to be the left circumflex with an occluded small posterior AV groove branch with collaterals, significant distal and proximal stenosis and an aneurysmal vessel especially in the midsegment.  The LAD has moderate disease that was not significant by fractional flow reserve evaluation.  The second diagonal has significant stenosis but small in size.  The right PDA also has significant mid stenosis but small in size. 2.  Normal LV systolic function and normal left ventricular end-diastolic pressure. 3.  Successful angioplasty and drug-eluting stent placement to the distal and proximal left circumflex.   Recommendations: Dual antiplatelet therapy for 12 months. Aggressive treatment of risk factors. Treat small vessel disease medically.   EKG:  EKG is ordered today.  The EKG ordered today demonstrates NSR, 74 bpm, no acute ST-T changes  Recent Labs: No results found for requested labs within last 365 days.  Recent Lipid Panel    Component Value Date/Time   CHOL 212 (H) 07/09/2022 0408   TRIG 83 07/09/2022 0408   HDL 67 07/09/2022 0408   CHOLHDL 3.2 07/09/2022 0408   VLDL 17 07/09/2022 0408   LDLCALC 128 (H) 07/09/2022 0408    PHYSICAL EXAM:    VS:  BP (!) 140/78 (BP Location: Left Arm, Patient Position: Sitting, Cuff Size: Normal)  Pulse 72   Resp 16   Ht 5\' 5"  (1.651 m)   Wt 110 lb (49.9 kg)   SpO2 99%   BMI 18.30 kg/m   BMI: Body mass index is 18.3 kg/m.  Physical Exam Vitals reviewed.  Constitutional:      Appearance: She is well-developed.  HENT:     Head: Normocephalic and atraumatic.  Eyes:     General:        Right eye: No discharge.        Left eye: No discharge.  Cardiovascular:     Rate and Rhythm: Normal rate and regular rhythm.     Heart sounds: Normal heart sounds, S1 normal and S2 normal. Heart sounds not distant. No midsystolic click and no opening snap. No murmur heard.    No friction rub.  Pulmonary:     Effort: Pulmonary effort is normal. No respiratory distress.     Breath sounds: Normal breath sounds. No decreased breath sounds, wheezing, rhonchi or rales.  Chest:     Chest wall: No tenderness.  Musculoskeletal:     Cervical back: Normal range of motion.     Right lower leg: No edema.     Left lower leg: No edema.  Skin:    General: Skin is warm and dry.     Nails: There is no clubbing.  Neurological:     Mental Status: She is alert and oriented to person, place, and time.  Psychiatric:        Speech: Speech normal.        Behavior: Behavior normal.        Thought Content: Thought content normal.        Judgment: Judgment normal.     Wt Readings from Last 3 Encounters:  08/01/23 110 lb (49.9 kg)  04/21/23 108 lb (49 kg)  03/15/23 110 lb 6 oz (50.1 kg)     ASSESSMENT & PLAN:   Labile HTN: Blood pressure is mildly elevated in the office today.  Continue amlodipine  2.5 mg twice daily along with carvedilol  6.25 mg twice daily.  CAD involving the native coronary arteries with recent NSTEMI without angina: No symptoms suggestive of angina or cardiac decompensation.  She may discontinue clopidogrel  when she has finished her current supply as she has been on DAPT for 12 months at this time.  She should continue aspirin  81 mg indefinitely.  Continue aggressive risk factor  modification and secondary prevention including amlodipine , carvedilol , and Repatha .  No indication for further ischemic testing at this time.  HLD with statin intolerance: LDL 94 in 10/2022 with target LDL less than 55.  Check lipid panel,.  LDL, and LP(a) given she is on PCSK9 inhibitor.  PAD, penetrating aortic ulceration, thoracic aortic aneurysm, infrarenal AAA: Followed by [facility] vascular surgery.  She remains on aspirin  and PCSK9 inhibitor.    Disposition: F/u with Dr. Junnie Olives or an APP in 6 months.   Medication Adjustments/Labs and Tests Ordered: Current medicines are reviewed at length with the patient today.  Concerns regarding medicines are outlined above. Medication changes, Labs and Tests ordered today are summarized above and listed in the Patient Instructions accessible in Encounters.   Signed, Varney Gentleman, PA-C 08/01/2023 5:38 PM     [facility] [place] [address] [place], [place] [address] [phone]

## 2023-08-01 ENCOUNTER — Ambulatory Visit: Payer: Medicare Other | Attending: Physician Assistant | Admitting: Physician Assistant

## 2023-08-01 ENCOUNTER — Encounter: Payer: Self-pay | Admitting: Physician Assistant

## 2023-08-01 VITALS — BP 140/78 | HR 72 | Resp 16 | Ht 65.0 in | Wt 110.0 lb

## 2023-08-01 DIAGNOSIS — I2510 Atherosclerotic heart disease of native coronary artery without angina pectoris: Secondary | ICD-10-CM | POA: Insufficient documentation

## 2023-08-01 DIAGNOSIS — I712 Thoracic aortic aneurysm, without rupture, unspecified: Secondary | ICD-10-CM | POA: Diagnosis present

## 2023-08-01 DIAGNOSIS — Z79899 Other long term (current) drug therapy: Secondary | ICD-10-CM | POA: Diagnosis present

## 2023-08-01 DIAGNOSIS — I7143 Infrarenal abdominal aortic aneurysm, without rupture: Secondary | ICD-10-CM | POA: Insufficient documentation

## 2023-08-01 DIAGNOSIS — R0989 Other specified symptoms and signs involving the circulatory and respiratory systems: Secondary | ICD-10-CM | POA: Insufficient documentation

## 2023-08-01 DIAGNOSIS — E785 Hyperlipidemia, unspecified: Secondary | ICD-10-CM | POA: Diagnosis present

## 2023-08-01 NOTE — Patient Instructions (Signed)
 Medication Instructions:  Your physician recommends the following medication changes.  STOP TAKING: Plavix  after current supply is completed.  Lab Work: Your provider would like for you to have following labs drawn today Lipid, LDL Direct, LP(a), BMet, and CBC.   If you have labs (blood work) drawn today and your tests are completely normal, you will receive your results only by: MyChart Message (if you have MyChart) OR A paper copy in the mail If you have any lab test that is abnormal or we need to change your treatment, we will call you to review the results.  Follow-Up: At [facility], you and your health needs are our priority.  As part of our continuing mission to provide you with exceptional heart care, our providers are all part of one team.  This team includes your primary Cardiologist (physician) and Advanced Practice Providers or APPs (Physician Assistants and Nurse Practitioners) who all work together to provide you with the care you need, when you need it.  Your next appointment:   6 month(s)  Provider:   You may see Constancia Delton, MD or Varney Gentleman, PA-C   We recommend signing up for the patient portal called "MyChart".  Sign up information is provided on this After Visit Summary.  MyChart is used to connect with patients for Virtual Visits (Telemedicine).  Patients are able to view lab/test results, encounter notes, upcoming appointments, etc.  Non-urgent messages can be sent to your provider as well.   To learn more about what you can do with MyChart, go to [email or web address].

## 2023-08-02 LAB — LDL CHOLESTEROL, DIRECT

## 2023-08-05 LAB — CBC
Hematocrit: 45 % (ref 34.0–46.6)
Hemoglobin: 14.9 g/dL (ref 11.1–15.9)
MCH: 30.7 pg (ref 26.6–33.0)
MCHC: 33.1 g/dL (ref 31.5–35.7)
MCV: 93 fL (ref 79–97)
Platelets: 244 10*3/uL (ref 150–450)
RBC: 4.86 x10E6/uL (ref 3.77–5.28)
RDW: 12.5 % (ref 11.7–15.4)
WBC: 8.6 10*3/uL (ref 3.4–10.8)

## 2023-08-05 LAB — BASIC METABOLIC PANEL WITH GFR
BUN/Creatinine Ratio: 26 (ref 12–28)
BUN: 23 mg/dL (ref 8–27)
CO2: 19 mmol/L — ABNORMAL LOW (ref 20–29)
Calcium: 9.8 mg/dL (ref 8.7–10.3)
Chloride: 105 mmol/L (ref 96–106)
Creatinine, Ser: 0.88 mg/dL (ref 0.57–1.00)
Glucose: 77 mg/dL (ref 70–99)
Potassium: 4.5 mmol/L (ref 3.5–5.2)
Sodium: 143 mmol/L (ref 134–144)
eGFR: 68 mL/min/{1.73_m2} (ref >59)

## 2023-08-05 LAB — LIPID PANEL
Chol/HDL Ratio: 3.1 ratio (ref 0.0–4.4)
Cholesterol, Total: 191 mg/dL (ref 100–199)
HDL: 62 mg/dL (ref >39)
LDL Chol Calc (NIH): 108 mg/dL — ABNORMAL HIGH (ref 0–99)
Triglycerides: 118 mg/dL (ref 0–149)
VLDL Cholesterol Cal: 21 mg/dL (ref 5–40)

## 2023-08-05 LAB — LIPOPROTEIN A (LPA): Lipoprotein (a): 371.2 nmol/L — ABNORMAL HIGH (ref <75.0)

## 2023-08-05 LAB — LDL CHOLESTEROL, DIRECT: LDL Direct: 107 mg/dL — ABNORMAL HIGH (ref 0–99)

## 2023-08-14 ENCOUNTER — Other Ambulatory Visit: Payer: Self-pay | Admitting: Emergency Medicine

## 2023-08-14 DIAGNOSIS — Z79899 Other long term (current) drug therapy: Secondary | ICD-10-CM

## 2023-08-14 DIAGNOSIS — E782 Mixed hyperlipidemia: Secondary | ICD-10-CM

## 2023-08-14 MED ORDER — EZETIMIBE 10 MG PO TABS: 10.0000 mg | ORAL_TABLET | Freq: Every day | ORAL | 3 refills | Status: DC

## 2023-10-01 ENCOUNTER — Other Ambulatory Visit: Payer: Self-pay | Admitting: Physician Assistant

## 2023-10-18 LAB — LIPID PANEL
Chol/HDL Ratio: 2.5 ratio (ref 0.0–4.4)
Cholesterol, Total: 140 mg/dL (ref 100–199)
HDL: 56 mg/dL (ref >39)
LDL Chol Calc (NIH): 66 mg/dL (ref 0–99)
Triglycerides: 95 mg/dL (ref 0–149)
VLDL Cholesterol Cal: 18 mg/dL (ref 5–40)

## 2023-10-18 LAB — HEPATIC FUNCTION PANEL
ALT: 19 IU/L (ref 0–32)
AST: 22 IU/L (ref 0–40)
Albumin: 4.2 g/dL (ref 3.8–4.8)
Alkaline Phosphatase: 137 IU/L — ABNORMAL HIGH (ref 44–121)
Bilirubin Total: 0.2 mg/dL (ref 0.0–1.2)
Bilirubin, Direct: 0.1 mg/dL (ref 0.00–0.40)
Total Protein: 6.4 g/dL (ref 6.0–8.5)

## 2023-10-20 ENCOUNTER — Ambulatory Visit: Payer: Self-pay | Admitting: Physician Assistant

## 2024-01-22 NOTE — Progress Notes (Unsigned)
 Cardiology Office Note    Date:  01/23/2024   ID:  Tiffany Munoz, DOB 05-Jun-1945, MRN [id number]  PCP:  Tiffany Lenis, MD  Cardiologist:  Tiffany Cave, MD  Electrophysiologist:  None   Chief Complaint: Follow up  History of Present Illness:   Tiffany Munoz is a 78 y.o. 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   She was previously followed by Tiffany Munoz.  Remote echo from [facility] in 2019 showed an EF of 65 to 70%, grade 1 diastolic dysfunction, degenerative mitral valve, moderate LVH, aortic sclerosis, and normal RV systolic function.  Nuclear stress testing in 2020 showed no evidence of ischemia or scar with an EF of 65%.     She was admitted to [facility] in late 06/2022 with chest pressure at rest that radiated to her back and felt similar to what she experienced when she was diagnosed with her aortic disease.  High-sensitivity troponin peaked at 3195.  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  Echo showed an EF of 55 to 60%, no regional wall motion abnormalities, mild LVH, grade 1 diastolic dysfunction, normal RV systolic function and ventricular cavity size, aortic valve sclerosis without evidence of stenosis, and an estimated right atrial pressure of 3 mmHg.  LHC showed the culprit vessel seemed to be the LCx with an occluded posterior AV groove branch with collaterals as well as significant distal and proximal stenosis along with an aneurysmal vessel especially in the midsegment.  The LAD had moderate disease that was not significant  by FFR.  D2 had significant disease, though was small in size.  The rPDA also had significant mid stenosis, though was small in size.  She underwent successful PCI/DES to the distal and proximal LCx.  She was seen in hospital follow-up on 08/02/2022 and reported a history of myalgias with possible myositis on daily statin therapy.  She was transitioned from rosuvastatin  to Repatha .  She was subsequently transitioned from ticagrelor  to clopidogrel  in the setting of noting a black eye without trauma.   She was seen in our office on 03/15/2023 and noted an episode of presyncope without frank syncope a couple of months prior.  She was evaluated by her PCP with recommendation to hold amlodipine .  Following this, in the setting of elevated BPs, her carvedilol  was titrated to 6.25 mg twice daily.  Amlodipine  was subsequently added back.   She was seen in our office in 04/2023 reporting an episode where her knees buckled and she fell on the floor in late December 2024.  She did not hit her head or suffer LOC.  She stayed on the floor and continued to talk on her telephone for several minutes.  Following this, she checked her blood pressure and found this to be 111/79.  She was without symptoms of angina or cardiac decompensation.  No frank syncope.  She had been asymptomatic since.  24-hour ambulatory BP monitor showed an average blood pressure 136/70 with an average heart rate of 63 bpm.  Overall, blood pressure was well-controlled with a rare isolated elevated or low blood  pressure event with low blood pressure of 99 mmHg systolic occurring at 9:39 PM.  She was last seen in our office in 07/2023, continuing to smoke less than half a pack per day and was doing well from a cardiac perspective.  Given she had completed DAPT, she was transitioned to aspirin  monotherapy.   With regards to her PAD, aortic ulcerative disease, and AAA, she is followed by [facility] vascular [facility].  CT chest, abdomen, pelvis in 11/2023 showed  redemonstrated extensive, mostly noncalcified atherosclerotic disease involving the aortic arch, descending thoracic aorta, abdominal aorta, and bilateral iliac arteries.  Multifocal aneurysmal dilatation of the aorta with stable thoracic aortic aneurysm measuring 4.2 x 4.1 cm, descending thoracic aortic region measuring 4.5 cm (increased from 4.4 cm) minimally increased aneurysm of the abdominal aorta at the hiatus measuring 3.9 x 2.7 cm (previously 3.7 x 3.5 cm, and stable infrarenal abdominal aortic aneurysm measuring 4.0 x 3.5 cm.  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  Severe narrowing involving the celiac artery, left renal arteries, and left common iliac artery similar to prior study.  She will undergo follow-up CT imaging at [facility] in 05/2024.  She comes in today and is doing well from a cardiac perspective, without symptoms of angina or cardiac decompensation.  Over the summer months that she was treated with multiple rounds of antibiotics for possible UTI.  In this setting she was feeling that well, though this is improved.  She is now beginning to reintroduce working out at the gym without cardiac limitation.  No lower extremity swelling, abdominal distention, or progressive orthopnea.  No falls, hematochezia, or melena.  Tolerating cardiac pharmacotherapy without off target effect.  She does not have any acute cardiac concerns at this time.   Labs independently reviewed: 12/2023 - Hgb 12.7, PLT 328, potassium 4.5, BUN 23, serum creatinine 0.9, albumin 3.5, AST/ALT normal, TSH normal 10/2023 - TC 140, TG 95, HDL 56, LDL 66 07/2023 - LP(a) 371.2  Past Medical History:  Diagnosis Date   AAA (abdominal aortic aneurysm)    CAD (coronary artery disease)    Emphysema of lung (HCC)    HLD (hyperlipidemia)    Hypertension    Migraine     Past Surgical History:  Procedure Laterality Date   CESAREAN SECTION     CORONARY PRESSURE/FFR STUDY N/A 07/11/2022   Procedure:  INTRAVASCULAR PRESSURE WIRE/FFR STUDY;  Surgeon: Darron Deatrice LABOR, MD;  Location: [facility] INVASIVE CV LAB;  Service: Cardiovascular;  Laterality: N/A;   CORONARY STENT INTERVENTION N/A 07/11/2022   Procedure: CORONARY STENT INTERVENTION;  Surgeon: Darron Deatrice LABOR, MD;  Location: [facility] INVASIVE CV LAB;  Service: Cardiovascular;  Laterality: N/A;   EXTERNAL EAR SURGERY     LEFT HEART CATH AND CORONARY ANGIOGRAPHY N/A 07/11/2022   Procedure: LEFT HEART CATH AND CORONARY ANGIOGRAPHY;  Surgeon: Darron Deatrice LABOR, MD;  Location: [facility] INVASIVE CV LAB;  Service: Cardiovascular;  Laterality: N/A;   TYMPANOSTOMY TUBE PLACEMENT     UTERINE FIBROID SURGERY      Current Medications: Current Meds  Medication Sig   amLODipine  (NORVASC ) 5 MG tablet Take 0.5 tablets (2.5 mg total) by mouth in the morning and at bedtime.   aspirin  81 MG tablet Take 2 tablets by mouth daily.   carvedilol  (COREG ) 6.25 MG tablet Take by mouth.   cholecalciferol (VITAMIN D3) 25 MCG (1000 UNIT) tablet Take 500 Units by mouth daily. (Patient taking differently: Take 1,000 Units by mouth daily.)  clobetasol cream (TEMOVATE) 0.05 % Apply 1 Application topically 3 (three) times daily as needed.   ezetimibe  (ZETIA ) 10 MG tablet Take 1 tablet (10 mg total) by mouth daily.   Multiple Vitamin (MULTIVITAMIN) tablet Take 1 tablet by mouth daily.   nitroGLYCERIN  (NITROSTAT ) 0.4 MG SL tablet Place 1 tablet (0.4 mg total) under the tongue every 5 (five) minutes x 3 doses as needed for chest pain.   potassium chloride  (MICRO-K ) 10 MEQ CR capsule Take 10 mEq by mouth daily.   REPATHA  SURECLICK 140 MG/ML SOAJ ADMINISTER 1 ML UNDER THE SKIN EVERY 14 DAYS    Allergies:   Wellbutrin [bupropion]   Social History   Socioeconomic History   Marital status: Legally Separated    Spouse name: Not on file   Number of children: 1   Years of education: Not on file   Highest education level: Not on file  Occupational History   Not on file  Tobacco Use    Smoking status: Every Day    Current packs/day: 0.50    Average packs/day: 0.5 packs/day for 55.0 years (27.5 ttl pk-yrs)    Types: Cigarettes   Smokeless tobacco: Never   Tobacco comments:    5 cigarettes a day working toward cessation  Vaping Use   Vaping status: Never Used  Substance and Sexual Activity   Alcohol use: Yes    Alcohol/week: 2.0 standard drinks of alcohol    Types: 2 Cans of beer per week   Drug use: Never   Sexual activity: Not on file  Other Topics Concern   Not on file  Social History Narrative   Not on file   Social Drivers of Health   Financial Resource Strain: Patient Declined (11/08/2022)   Received from [facility]   Overall Financial Resource Strain (CARDIA)    Difficulty of Paying Living Expenses: Patient declined  Food Insecurity: Patient Declined (11/08/2022)   Received from [facility]   Hunger Vital Sign    Within the past 12 months, you worried that your food would run out before you got the money to buy more.: Patient declined    Within the past 12 months, the food you bought just didn't last and you didn't have money to get more.: Patient declined  Transportation Needs: Patient Declined (11/08/2022)   Received from [facility] - Transportation    In the past 12 months, has lack of transportation kept you from medical appointments or from getting medications?: Patient declined    Lack of Transportation (Non-Medical): Patient declined  Physical Activity: Unknown (02/24/2017)   Received from [facility]   Exercise Vital Sign    Days of Exercise per Week: Patient declined    Minutes of Exercise per Session: Patient declined  Stress: Unknown (02/24/2017)   Received from [facility] - Occupational Stress Questionnaire    Feeling of Stress : Patient declined  Social Connections: Unknown (02/24/2017)    Received from [facility]   Social Connection and Isolation Panel    Frequency of Communication with Friends and Family: Patient declined    Frequency of Social Gatherings with Friends and Family: Patient declined    Attends Religious Services: Patient declined    Active Member of Clubs or Organizations: Patient declined    Attends Banker Meetings: Patient declined    Marital Status: Patient declined  Family History:  The patient's family history includes Cancer in her brother; Gout in her brother; Heart disease in her father and mother; Hemochromatosis in her brother; Hypertension in her brother; Stroke in her sister.  ROS:   12-point review of systems is negative unless otherwise noted in the HPI.   EKGs/Labs/Other Studies Reviewed:    Studies reviewed were summarized above. The additional studies were reviewed today:  2D echo 07/09/2022: 1. Left ventricular ejection fraction, by estimation, is 55 to 60%. Left  ventricular ejection fraction by 2D MOD biplane is 66.1 %. The left  ventricle has normal function. The left ventricle has no regional wall  motion abnormalities. There is mild left  ventricular hypertrophy. Left ventricular diastolic parameters are  consistent with Grade I diastolic dysfunction (impaired relaxation).   2. Right ventricular systolic function is normal. The right ventricular  size is normal.   3. The mitral valve is normal in structure. No evidence of mitral valve  regurgitation.   4. The aortic valve is tricuspid. Aortic valve regurgitation is not  visualized. Aortic valve sclerosis is present, with no evidence of aortic  valve stenosis.   5. The inferior vena cava is normal in size with greater than 50%  respiratory variability, suggesting right atrial pressure of 3 mmHg.  __________   LHC 07/11/2022:   Prox LAD lesion is 30% stenosed.   Mid LAD lesion is 50% stenosed.   Mid Cx to Dist Cx lesion is 95% stenosed.    Prox Cx to Mid Cx lesion is 85% stenosed.   Prox RCA lesion is 30% stenosed.   Mid RCA lesion is 30% stenosed.   Dist RCA lesion is 60% stenosed.   RPDA lesion is 70% stenosed.   2nd Diag lesion is 80% stenosed.   LPAV lesion is 100% stenosed.   Mid Cx lesion is 30% stenosed.   A drug-eluting stent was successfully placed using a STENT ONYX FRONTIER 2.25X15.   A drug-eluting stent was successfully placed using a STENT ONYX FRONTIER 4.0X12.   Post intervention, there is a 0% residual stenosis.   Post intervention, there is a 0% residual stenosis.   The left ventricular systolic function is normal.   LV end diastolic pressure is normal.   The left ventricular ejection fraction is 55-65% by visual estimate.   1.  Non-ST elevation myocardial infarction.  The culprit seems to be the left circumflex with an occluded small posterior AV groove branch with collaterals, significant distal and proximal stenosis and an aneurysmal vessel especially in the midsegment.  The LAD has moderate disease that was not significant by fractional flow reserve evaluation.  The second diagonal has significant stenosis but small in size.  The right PDA also has significant mid stenosis but small in size. 2.  Normal LV systolic function and normal left ventricular end-diastolic pressure. 3.  Successful angioplasty and drug-eluting stent placement to the distal and proximal left circumflex.   Recommendations: Dual antiplatelet therapy for 12 months. Aggressive treatment of risk factors. Treat small vessel disease medically. __________  24-hour ambulatory BP monitor 05/2023: Mean BP 136/70 mmHg Average HR 63 BPM    EKG:  EKG is ordered today.  The EKG ordered today demonstrates NSR, 72 bpm, no acute ST-T changes  Recent Labs: 08/01/2023: BUN 23; Creatinine, Ser 0.88; Hemoglobin 14.9; Platelets 244; Potassium 4.5; Sodium 143 10/17/2023: ALT 19  Recent Lipid Panel    Component Value Date/Time   CHOL 140  10/17/2023 1449   TRIG 95  10/17/2023 1449   HDL 56 10/17/2023 1449   CHOLHDL 2.5 10/17/2023 1449   CHOLHDL 3.2 07/09/2022 0408   VLDL 17 07/09/2022 0408   LDLCALC 66 10/17/2023 1449   LDLDIRECT 107 (H) 08/01/2023 1606    PHYSICAL EXAM:    VS:  BP 128/70 (BP Location: Left Arm, Patient Position: Sitting, Cuff Size: Normal)   Pulse 72   Ht 5' 4 (1.626 m)   Wt 104 lb (47.2 kg)   SpO2 98%   BMI 17.85 kg/m   BMI: Body mass index is 17.85 kg/m.  Physical Exam Vitals reviewed.  Constitutional:      Appearance: She is well-developed.  HENT:     Head: Normocephalic and atraumatic.  Eyes:     General:        Right eye: No discharge.        Left eye: No discharge.  Cardiovascular:     Rate and Rhythm: Normal rate and regular rhythm.     Heart sounds: Normal heart sounds, S1 normal and S2 normal. Heart sounds not distant. No midsystolic click and no opening snap. No murmur heard.    No friction rub.  Pulmonary:     Effort: Pulmonary effort is normal. No respiratory distress.     Breath sounds: Normal breath sounds. No decreased breath sounds, wheezing, rhonchi or rales.  Musculoskeletal:     Cervical back: Normal range of motion.     Right lower leg: No edema.     Left lower leg: No edema.  Skin:    General: Skin is warm and dry.     Nails: There is no clubbing.  Neurological:     Mental Status: She is alert and oriented to person, place, and time.  Psychiatric:        Speech: Speech normal.        Behavior: Behavior normal.        Thought Content: Thought content normal.        Judgment: Judgment normal.     Wt Readings from Last 3 Encounters:  01/23/24 104 lb (47.2 kg)  08/01/23 110 lb (49.9 kg)  04/21/23 108 lb (49 kg)     ASSESSMENT & PLAN:   Labile HTN: Blood pressure is well-controlled in the office today.  She remains on amlodipine  2.5 mg twice daily and carvedilol  6.25 mg twice daily.  CAD involving the native coronary arteries with NSTEMI without angina:  She is doing well and without symptoms concerning for angina or cardiac decompensation.  Continue aggressive risk factor modification and secondary prevention including aspirin  81 mg and PCSK9 inhibitor.  No indication for further ischemic testing at this time.   HLD with statin intolerance: LDL 66 in 10/2023.  Remains on Repatha  140 mg injected every 14 days and ezetimibe  10 mg.  PAD, penetrating aortic ulceration, thoracic aortic aneurysm, infrarenal AAA: Hemodynamically stable.  Followed by [facility] vascular surgery. She remains on aspirin  and PCSK9 inhibitor.      Disposition: F/u with Dr. Darliss or an APP in 6 months.   Medication Adjustments/Labs and Tests Ordered: Current medicines are reviewed at length with the patient today.  Concerns regarding medicines are outlined above. Medication changes, Labs and Tests ordered today are summarized above and listed in the Patient Instructions accessible in Encounters.   Signed, Bernardino Bring, PA-C 01/23/2024 5:03 PM     [facility] -  [address] [place], [place] [address] [phone]

## 2024-01-23 ENCOUNTER — Ambulatory Visit: Attending: Physician Assistant | Admitting: Physician Assistant

## 2024-01-23 ENCOUNTER — Encounter: Payer: Self-pay | Admitting: Physician Assistant

## 2024-01-23 VITALS — BP 128/70 | HR 72 | Ht 64.0 in | Wt 104.0 lb

## 2024-01-23 DIAGNOSIS — Z789 Other specified health status: Secondary | ICD-10-CM | POA: Diagnosis present

## 2024-01-23 DIAGNOSIS — E785 Hyperlipidemia, unspecified: Secondary | ICD-10-CM | POA: Diagnosis present

## 2024-01-23 DIAGNOSIS — I719 Aortic aneurysm of unspecified site, without rupture: Secondary | ICD-10-CM | POA: Insufficient documentation

## 2024-01-23 DIAGNOSIS — I7143 Infrarenal abdominal aortic aneurysm, without rupture: Secondary | ICD-10-CM | POA: Insufficient documentation

## 2024-01-23 DIAGNOSIS — I739 Peripheral vascular disease, unspecified: Secondary | ICD-10-CM | POA: Diagnosis present

## 2024-01-23 DIAGNOSIS — I2510 Atherosclerotic heart disease of native coronary artery without angina pectoris: Secondary | ICD-10-CM | POA: Diagnosis not present

## 2024-01-23 DIAGNOSIS — R0989 Other specified symptoms and signs involving the circulatory and respiratory systems: Secondary | ICD-10-CM | POA: Diagnosis present

## 2024-01-23 DIAGNOSIS — I712 Thoracic aortic aneurysm, without rupture, unspecified: Secondary | ICD-10-CM | POA: Diagnosis not present

## 2024-01-23 DIAGNOSIS — I700 Atherosclerosis of aorta: Secondary | ICD-10-CM | POA: Diagnosis present

## 2024-01-23 NOTE — Patient Instructions (Signed)
 Medication Instructions:  Your physician recommends that you continue on your current medications as directed. Please refer to the Current Medication list given to you today.   *If you need a refill on your cardiac medications before your next appointment, please call your pharmacy*  Lab Work: None ordered at this time   Follow-Up: At [facility], you and your health needs are our priority.  As part of our continuing mission to provide you with exceptional heart care, our providers are all part of one team.  This team includes your primary Cardiologist (physician) and Advanced Practice Providers or APPs (Physician Assistants and Nurse Practitioners) who all work together to provide you with the care you need, when you need it.  Your next appointment:   6 month(s)  Provider:   You may see Redell Cave, MD or Bernardino Bring, PA-C  We recommend signing up for the patient portal called MyChart.  Sign up information is provided on this After Visit Summary.  MyChart is used to connect with patients for Virtual Visits (Telemedicine).  Patients are able to view lab/test results, encounter notes, upcoming appointments, etc.  Non-urgent messages can be sent to your provider as well.   To learn more about what you can do with MyChart, go to [email or web address].

## 2024-03-14 ENCOUNTER — Ambulatory Visit

## 2024-03-14 ENCOUNTER — Ambulatory Visit: Admitting: Urology

## 2024-03-14 VITALS — BP 120/70 | HR 74 | Wt 99.0 lb

## 2024-03-14 DIAGNOSIS — Z87440 Personal history of urinary (tract) infections: Secondary | ICD-10-CM | POA: Diagnosis not present

## 2024-03-14 DIAGNOSIS — R3989 Other symptoms and signs involving the genitourinary system: Secondary | ICD-10-CM

## 2024-03-14 DIAGNOSIS — N390 Urinary tract infection, site not specified: Secondary | ICD-10-CM

## 2024-03-14 LAB — URINALYSIS, COMPLETE
Bilirubin, UA: NEGATIVE
Glucose, UA: NEGATIVE
Ketones, UA: NEGATIVE
Nitrite, UA: NEGATIVE
Protein,UA: NEGATIVE
Specific Gravity, UA: 1.015 (ref 1.005–1.030)
Urobilinogen, Ur: 0.2 mg/dL (ref 0.2–1.0)
pH, UA: 6 (ref 5.0–7.5)

## 2024-03-14 LAB — MICROSCOPIC EXAMINATION: Epithelial Cells (non renal): >10 /HPF — AB (ref 0–10)

## 2024-03-14 MED ORDER — ESTRADIOL 0.01 % VA CREA: TOPICAL_CREAM | VAGINAL | 12 refills | Status: AC

## 2024-03-14 NOTE — Progress Notes (Signed)
 03/14/2024 9:32 AM   Tiffany Munoz March 14, 1946 [id number]  Referring provider: Glover Lenis, MD [phone] [place],  [place] [address]  Chief Complaint  Patient presents with   Urinary Tract Infection   HPI: Tiffany Munoz Patient is a 78 -year-old  female who is referred to [facility] by her PCP for recurrent urinary tract infections. She most recently completed a course of Macrobid 100 mg prescribed on 03/05/24.   Urologic History:  11/08/23: first presented to her PCP for dysuria, frequency, and malaise. UTI was confirmed by urinalysis. Culture was sent. Prescribed one week course of Keflex. 11/13/23: Presents to PCP with weakness and chills. Repeat U/A was positive. 01/30/24: Presents to PCP for genitourinary symptoms. Started on Macrobid 100 mg BID for 5 days. 02/06/24: Positive urinalysis.  02/22/24: Positive urinalysis and culture.   Patient states that she has had 7 urinary tract infections dating back to 11/08/23. Reviewing her records, she has seven documented positive urinalysis w/microscopy, as well as five  E. Coli sensitive cultures documented on 7/30, 8/18,9/02,10/21, and 11/13.   Her symptoms with a urinary tract infection consist of urgency, weakness, frequency, and dysuria. Today, she reports no symptoms. She has no history of nephrolithiasis. No history of GU system abnormalities. Has history of uterine fibroids. She reports she was last sexually active July 2025. She has not noted a correlation with her urinary tract infections and sexual intercourse. Denies aggravating factors. Alleviating factors include the use of antibiotics.   She is postmenopausal. She denies vaginal discharge or vaginal irritation. She has never used vaginal estrogen. Denies history of incontinence, but reports using panty liners on a daily basis for comfort. She has no pain with bladder filling. She is drinking two 16 oz water bottle daily. States she prefers to drink lipton green tea  and coffee.   She has not had any recent imaging studies.    She denies chest pain, SOB, N/V/D, fever, chills, dysuria, urinary frequency, urinary urgency, back pain, hematuria, abdominal pain, constipation, or fecal incontinence.   Urinalysis 03/14/24: Color: yellow Clarity: clear SG: 1.015 pH: 6.0 GLU: negative PRO: negative BLO: trace-intact BIL: negative URO: 0.2 NIT: negative KET: negative LEU: trace    PMH: Past Medical History:  Diagnosis Date   AAA (abdominal aortic aneurysm)    CAD (coronary artery disease)    Emphysema of lung (HCC)    HLD (hyperlipidemia)    Hypertension    Migraine     Surgical History: Past Surgical History:  Procedure Laterality Date   CESAREAN SECTION     CORONARY PRESSURE/FFR STUDY N/A 07/11/2022   Procedure: INTRAVASCULAR PRESSURE WIRE/FFR STUDY;  Surgeon: Darron Deatrice LABOR, MD;  Location: [facility] INVASIVE CV LAB;  Service: Cardiovascular;  Laterality: N/A;   CORONARY STENT INTERVENTION N/A 07/11/2022   Procedure: CORONARY STENT INTERVENTION;  Surgeon: Darron Deatrice LABOR, MD;  Location: [facility] INVASIVE CV LAB;  Service: Cardiovascular;  Laterality: N/A;   EXTERNAL EAR SURGERY     LEFT HEART CATH AND CORONARY ANGIOGRAPHY N/A 07/11/2022   Procedure: LEFT HEART CATH AND CORONARY ANGIOGRAPHY;  Surgeon: Darron Deatrice LABOR, MD;  Location: [facility] INVASIVE CV LAB;  Service: Cardiovascular;  Laterality: N/A;   TYMPANOSTOMY TUBE PLACEMENT     UTERINE FIBROID SURGERY      Home Medications:  Allergies as of 03/14/2024       Reactions   Wellbutrin [bupropion]    hives        Medication List  Accurate as of March 14, 2024 11:59 PM. If you have any questions, ask your nurse or doctor.          amLODipine  5 MG tablet Commonly known as: NORVASC  Take 0.5 tablets (2.5 mg total) by mouth in the morning and at bedtime.   aspirin  81 MG tablet Take 2 tablets by mouth daily.   carvedilol  6.25 MG tablet Commonly known as: COREG  Take by  mouth.   cholecalciferol 25 MCG (1000 UNIT) tablet Commonly known as: VITAMIN D3 Take 500 Units by mouth daily. What changed: how much to take   clobetasol cream 0.05 % Commonly known as: TEMOVATE Apply 1 Application topically 3 (three) times daily as needed.   estradiol  0.01 % Crea vaginal cream Commonly known as: ESTRACE  Apply one pea-sized amount around the opening of the urethra daily for 2 weeks, then 3 times weekly moving forward.   ezetimibe  10 MG tablet Commonly known as: ZETIA  Take 1 tablet (10 mg total) by mouth daily.   multivitamin tablet Take 1 tablet by mouth daily.   nitroGLYCERIN  0.4 MG SL tablet Commonly known as: NITROSTAT  Place 1 tablet (0.4 mg total) under the tongue every 5 (five) minutes x 3 doses as needed for chest pain.   potassium chloride  10 MEQ CR capsule Commonly known as: MICRO-K  Take 10 mEq by mouth daily.   Repatha  SureClick 140 MG/ML Soaj Generic drug: Evolocumab  ADMINISTER 1 ML UNDER THE SKIN EVERY 14 DAYS        Allergies:  Allergies  Allergen Reactions   Wellbutrin [Bupropion]     hives    Family History: Family History  Problem Relation Age of Onset   Heart disease Mother    Heart disease Father    Stroke Sister    Hypertension Brother    Cancer Brother    Hemochromatosis Brother    Gout Brother     Social History:  reports that she has been smoking cigarettes. She has a 27.5 pack-year smoking history. She has never used smokeless tobacco. She reports current alcohol use of about 2.0 standard drinks of alcohol per week. She reports that she does not use drugs.   Physical Exam: BP 120/70 (BP Location: Left Arm, Patient Position: Sitting, Cuff Size: Normal)   Pulse 74   Wt 99 lb (44.9 kg)   SpO2 97%   BMI 16.99 kg/m   Constitutional:  Alert and oriented, No acute distress. HEENT: Moist mucus membranes.   Cardiovascular: No clubbing, cyanosis, or edema. Respiratory: Normal respiratory effort, no increased work of  breathing. GI: Abdomen is soft, nontender, nondistended, no abdominal masses GU: No CVA tenderness Skin: No rashes, bruises or suspicious lesions. Neurologic: Grossly intact, no focal deficits, moving all 4 extremities. Psychiatric: Normal mood and affect.  Laboratory Data: Lab Results  Component Value Date   WBC 8.6 08/01/2023   HGB 14.9 08/01/2023   HCT 45.0 08/01/2023   MCV 93 08/01/2023   PLT 244 08/01/2023    Lab Results  Component Value Date   CREATININE 0.88 08/01/2023    Lab Results  Component Value Date   HGBA1C 5.1 07/09/2022    Urinalysis    Component Value Date/Time   COLORURINE YELLOW (A) 09/10/2018 1729   APPEARANCEUR Clear 03/14/2024 1349   LABSPEC 1.013 09/10/2018 1729   PHURINE 5.0 09/10/2018 1729   GLUCOSEU Negative 03/14/2024 1349   HGBUR NEGATIVE 09/10/2018 1729   BILIRUBINUR Negative 03/14/2024 1349   KETONESUR NEGATIVE 09/10/2018 1729   PROTEINUR Negative 03/14/2024 1349  PROTEINUR NEGATIVE 09/10/2018 1729   NITRITE Negative 03/14/2024 1349   NITRITE NEGATIVE 09/10/2018 1729   LEUKOCYTESUR Trace (A) 03/14/2024 1349   LEUKOCYTESUR NEGATIVE 09/10/2018 1729    Lab Results  Component Value Date   LABMICR See below: 03/14/2024   WBCUA 11-30 (A) 03/14/2024   LABEPIT >10 (A) 03/14/2024   MUCUS Present (A) 03/14/2024   BACTERIA Many (A) 03/14/2024   Assessment & Plan:   Tiffany Munoz is a 64 -year-old female who is referred to us  by her PCP for recurrent urinary tract infections.   Recurrent UTI: -Discussed with patient the criteria for recurrent UTI has been met with 2 or more infections in 6 months or 3 or greater infections in one year.  - Ordered urine culture. Will follow up pending culture results to determine next steps.  - Begin estradiol  vaginal cream first-line therapy for postmenopausal women - Begin cranberry tablet supplements - Begin taking probiotics that include oral capsules with Lactobacillus rhamnosus GR-1 and  Lactobacillus reuteri RC-14 in postmenopausal women - Patient is instructed to increase their water intake until the urine is pale yellow or clear (10 to 12 cups daily)    Marry MALVA Sara, PA-C  [facility] [address], [address] [place], [place] [address] [phone]

## 2024-03-20 ENCOUNTER — Ambulatory Visit: Payer: Self-pay

## 2024-03-20 LAB — CULTURE, URINE COMPREHENSIVE

## 2024-03-20 NOTE — Telephone Encounter (Signed)
-----   Message from Marry KIDD Aiden sent at 03/20/2024 12:10 PM EST ----- Patient culture showed gardnerella vaginalis. If she is not currently experiencing symptoms we can have her come in at her convenience to give us  a cath specimen. We can let her know that if she  begins to develop symptoms, she can call the office to schedule an appointment to be seen. I would also like to schedule her for a follow up in 3 months to discuss usage of her vaginal estrogen.  Thank you! ----- Message ----- From: Interface, [facility] Lab Results In Sent: 03/14/2024   4:36 PM EST To: Marry KIDD Aiden, PA-C

## 2024-03-20 NOTE — Telephone Encounter (Signed)
 Spoke with patient in regards to urine results. Patient denies having any current urological symptoms. Patient to be scheduled for a 3 month follow-up appointment. Patient verbalized understanding of information given.  Andrea Kirks LPN

## 2024-03-29 ENCOUNTER — Inpatient Hospital Stay
Admission: EM | Admit: 2024-03-29 | Discharge: 2024-04-01 | DRG: 282 | Disposition: A | Discharge status: Home / Self Care | Attending: Internal Medicine | Admitting: Internal Medicine

## 2024-03-29 ENCOUNTER — Other Ambulatory Visit: Payer: Self-pay

## 2024-03-29 ENCOUNTER — Emergency Department

## 2024-03-29 ENCOUNTER — Observation Stay: Admit: 2024-03-29 | Discharge: 2024-03-29 | Disposition: A | Attending: Internal Medicine

## 2024-03-29 DIAGNOSIS — F17210 Nicotine dependence, cigarettes, uncomplicated: Secondary | ICD-10-CM | POA: Diagnosis present

## 2024-03-29 DIAGNOSIS — I10 Essential (primary) hypertension: Secondary | ICD-10-CM | POA: Diagnosis present

## 2024-03-29 DIAGNOSIS — I25119 Atherosclerotic heart disease of native coronary artery with unspecified angina pectoris: Secondary | ICD-10-CM | POA: Diagnosis not present

## 2024-03-29 DIAGNOSIS — I214 Non-ST elevation (NSTEMI) myocardial infarction: Secondary | ICD-10-CM | POA: Diagnosis not present

## 2024-03-29 DIAGNOSIS — I739 Peripheral vascular disease, unspecified: Secondary | ICD-10-CM | POA: Diagnosis present

## 2024-03-29 DIAGNOSIS — Z823 Family history of stroke: Secondary | ICD-10-CM

## 2024-03-29 DIAGNOSIS — Z635 Disruption of family by separation and divorce: Secondary | ICD-10-CM

## 2024-03-29 DIAGNOSIS — R072 Precordial pain: Secondary | ICD-10-CM

## 2024-03-29 DIAGNOSIS — Z720 Tobacco use: Secondary | ICD-10-CM

## 2024-03-29 DIAGNOSIS — I712 Thoracic aortic aneurysm, without rupture, unspecified: Secondary | ICD-10-CM

## 2024-03-29 DIAGNOSIS — Z79899 Other long term (current) drug therapy: Secondary | ICD-10-CM

## 2024-03-29 DIAGNOSIS — R079 Chest pain, unspecified: Principal | ICD-10-CM | POA: Diagnosis present

## 2024-03-29 DIAGNOSIS — E785 Hyperlipidemia, unspecified: Secondary | ICD-10-CM

## 2024-03-29 DIAGNOSIS — Z888 Allergy status to other drugs, medicaments and biological substances status: Secondary | ICD-10-CM

## 2024-03-29 DIAGNOSIS — I2510 Atherosclerotic heart disease of native coronary artery without angina pectoris: Secondary | ICD-10-CM | POA: Diagnosis present

## 2024-03-29 DIAGNOSIS — J439 Emphysema, unspecified: Secondary | ICD-10-CM | POA: Diagnosis present

## 2024-03-29 DIAGNOSIS — Z87440 Personal history of urinary (tract) infections: Secondary | ICD-10-CM

## 2024-03-29 DIAGNOSIS — E782 Mixed hyperlipidemia: Secondary | ICD-10-CM | POA: Diagnosis present

## 2024-03-29 DIAGNOSIS — Z7982 Long term (current) use of aspirin: Secondary | ICD-10-CM

## 2024-03-29 DIAGNOSIS — Z8349 Family history of other endocrine, nutritional and metabolic diseases: Secondary | ICD-10-CM

## 2024-03-29 DIAGNOSIS — Z955 Presence of coronary angioplasty implant and graft: Secondary | ICD-10-CM

## 2024-03-29 DIAGNOSIS — I714 Abdominal aortic aneurysm, without rupture, unspecified: Secondary | ICD-10-CM | POA: Diagnosis present

## 2024-03-29 DIAGNOSIS — Z8249 Family history of ischemic heart disease and other diseases of the circulatory system: Secondary | ICD-10-CM

## 2024-03-29 DIAGNOSIS — I7122 Aneurysm of the aortic arch, without rupture: Secondary | ICD-10-CM | POA: Diagnosis present

## 2024-03-29 DIAGNOSIS — R7989 Other specified abnormal findings of blood chemistry: Secondary | ICD-10-CM

## 2024-03-29 LAB — BASIC METABOLIC PANEL WITH GFR
Anion gap: 9 (ref 5–15)
BUN: 26 mg/dL — ABNORMAL HIGH (ref 8–23)
CO2: 26 mmol/L (ref 22–32)
Calcium: 9.5 mg/dL (ref 8.9–10.3)
Chloride: 104 mmol/L (ref 98–111)
Creatinine, Ser: 0.79 mg/dL (ref 0.44–1.00)
GFR, Estimated: >60 mL/min
Glucose, Bld: 96 mg/dL (ref 70–99)
Potassium: 3.7 mmol/L (ref 3.5–5.1)
Sodium: 138 mmol/L (ref 135–145)

## 2024-03-29 LAB — ECHOCARDIOGRAM COMPLETE
Area-P 1/2: 2.91 cm2
Height: 65 in
S' Lateral: 2.5 cm
Weight: 1568 [oz_av]

## 2024-03-29 LAB — CBC
HCT: 41.8 % (ref 36.0–46.0)
Hemoglobin: 13.6 g/dL (ref 12.0–15.0)
MCH: 30 pg (ref 26.0–34.0)
MCHC: 32.5 g/dL (ref 30.0–36.0)
MCV: 92.1 fL (ref 80.0–100.0)
Platelets: 251 K/uL (ref 150–400)
RBC: 4.54 MIL/uL (ref 3.87–5.11)
RDW: 14.1 % (ref 11.5–15.5)
WBC: 9.3 K/uL (ref 4.0–10.5)
nRBC: 0 % (ref 0.0–0.2)

## 2024-03-29 LAB — MAGNESIUM: Magnesium: 2 mg/dL (ref 1.7–2.4)

## 2024-03-29 LAB — PROTIME-INR
INR: 1.2 (ref 0.8–1.2)
Prothrombin Time: 15.8 s — ABNORMAL HIGH (ref 11.4–15.2)

## 2024-03-29 LAB — APTT: aPTT: 187 s (ref 24–36)

## 2024-03-29 LAB — TROPONIN T, HIGH SENSITIVITY
Troponin T High Sensitivity: 16 ng/L (ref 0–19)
Troponin T High Sensitivity: 40 ng/L — ABNORMAL HIGH (ref 0–19)
Troponin T High Sensitivity: 54 ng/L — ABNORMAL HIGH (ref 0–19)
Troponin T High Sensitivity: 66 ng/L — ABNORMAL HIGH (ref 0–19)

## 2024-03-29 MED ORDER — CARVEDILOL 6.25 MG PO TABS
6.2500 mg | ORAL_TABLET | Freq: Two times a day (BID) | ORAL | Status: DC
  Administered 2024-03-29 – 2024-04-01 (×6): 6.25 mg via ORAL
  Filled 2024-03-29 (×6): qty 1

## 2024-03-29 MED ORDER — EZETIMIBE 10 MG PO TABS
10.0000 mg | ORAL_TABLET | Freq: Every day | ORAL | Status: DC
  Administered 2024-03-30 – 2024-04-01 (×3): 10 mg via ORAL
  Filled 2024-03-29 (×3): qty 1

## 2024-03-29 MED ORDER — VITAMIN D 25 MCG (1000 UNIT) PO TABS
1000.0000 [IU] | ORAL_TABLET | Freq: Every day | ORAL | Status: DC
  Administered 2024-03-30 – 2024-04-01 (×3): 1000 [IU] via ORAL
  Filled 2024-03-29 (×3): qty 1

## 2024-03-29 MED ORDER — ONDANSETRON HCL 4 MG PO TABS: 4.0000 mg | ORAL_TABLET | Freq: Four times a day (QID) | ORAL | Status: DC | PRN

## 2024-03-29 MED ORDER — NITROGLYCERIN 0.4 MG SL SUBL
0.4000 mg | SUBLINGUAL_TABLET | Freq: Once | SUBLINGUAL | Status: AC
  Administered 2024-03-29: 0.4 mg via SUBLINGUAL
  Filled 2024-03-29: qty 1

## 2024-03-29 MED ORDER — ONDANSETRON HCL 4 MG/2ML IJ SOLN: 4.0000 mg | Freq: Four times a day (QID) | INTRAMUSCULAR | Status: DC | PRN

## 2024-03-29 MED ORDER — ASPIRIN 81 MG PO TBEC
162.0000 mg | DELAYED_RELEASE_TABLET | Freq: Every day | ORAL | Status: DC
  Administered 2024-03-30 – 2024-03-31 (×2): 162 mg via ORAL
  Filled 2024-03-29 (×2): qty 2

## 2024-03-29 MED ORDER — ACETAMINOPHEN 325 MG PO TABS: 650.0000 mg | ORAL_TABLET | Freq: Four times a day (QID) | ORAL | Status: DC | PRN

## 2024-03-29 MED ORDER — SENNOSIDES-DOCUSATE SODIUM 8.6-50 MG PO TABS: 1.0000 | ORAL_TABLET | Freq: Every evening | ORAL | Status: DC | PRN

## 2024-03-29 MED ORDER — HEPARIN (PORCINE) 25000 UT/250ML-% IV SOLN
900.0000 [IU]/h | INTRAVENOUS | Status: DC
  Administered 2024-03-29: 500 [IU]/h via INTRAVENOUS
  Administered 2024-03-31: 850 [IU]/h via INTRAVENOUS
  Filled 2024-03-29 (×2): qty 250

## 2024-03-29 MED ORDER — NITROGLYCERIN 0.4 MG SL SUBL: 0.4000 mg | SUBLINGUAL_TABLET | SUBLINGUAL | Status: DC | PRN

## 2024-03-29 MED ORDER — SODIUM CHLORIDE 0.9% FLUSH
3.0000 mL | Freq: Two times a day (BID) | INTRAVENOUS | Status: DC
  Administered 2024-03-29 – 2024-03-31 (×6): 3 mL via INTRAVENOUS

## 2024-03-29 MED ORDER — ACETAMINOPHEN 650 MG RE SUPP: 650.0000 mg | Freq: Four times a day (QID) | RECTAL | Status: DC | PRN

## 2024-03-29 MED ORDER — HEPARIN BOLUS VIA INFUSION
2500.0000 [IU] | Freq: Once | INTRAVENOUS | Status: AC
  Administered 2024-03-29: 2500 [IU] via INTRAVENOUS
  Filled 2024-03-29: qty 2500

## 2024-03-29 MED ORDER — HEPARIN SODIUM (PORCINE) 5000 UNIT/ML IJ SOLN
5000.0000 [IU] | Freq: Three times a day (TID) | INTRAMUSCULAR | Status: DC
  Administered 2024-03-29: 5000 [IU] via SUBCUTANEOUS
  Filled 2024-03-29: qty 1

## 2024-03-29 MED ORDER — AMLODIPINE BESYLATE 5 MG PO TABS
2.5000 mg | ORAL_TABLET | Freq: Two times a day (BID) | ORAL | Status: DC
  Administered 2024-03-29 – 2024-04-01 (×6): 2.5 mg via ORAL
  Filled 2024-03-29 (×6): qty 1

## 2024-03-29 NOTE — Progress Notes (Signed)
2D echocardiogram performed.  

## 2024-03-29 NOTE — Progress Notes (Signed)
 PHARMACY - ANTICOAGULATION CONSULT NOTE  Pharmacy Consult for Heparin  infusion Indication: chest pain/ACS  Allergies[1]  Patient Measurements: Height: 5' 5 (165.1 cm) Weight: 46.6 kg (102 lb 12.8 oz) IBW/kg (Calculated) : 57 HEPARIN  DW (KG): 44.5  Vital Signs: Temp: 98.4 F (36.9 C) (12/19 1711) Temp Source: Oral (12/19 1711) BP: 122/69 (12/19 1711) Pulse Rate: 56 (12/19 1711)  Labs: Recent Labs    03/29/24 0645  HGB 13.6  HCT 41.8  PLT 251  CREATININE 0.79    Estimated Creatinine Clearance: 44 mL/min (by C-G formula based on SCr of 0.79 mg/dL).   Medical History: Past Medical History:  Diagnosis Date   AAA (abdominal aortic aneurysm)    CAD (coronary artery disease)    Emphysema of lung (HCC)    HLD (hyperlipidemia)    Hypertension    Migraine     Medications:  Not on anticoagulation at home per med rec and chart review Heparin  SQ for DVT ppx given 12/19 @ 1513  Assessment: Patient is a 78 year old female with a past medical history of HTN, HLD, CAD c/b NSTEMI s/p stent placement in 07/2022, thoracic and abdominal aortic aneurysm who presented to ED with chest pain. Troponin level 40>54>66. Pharmacy has been consulted to initiate patient on a heparin  infusion for ACS/STEMI.   Baseline INR and aPTT ordered. Hgb 13.6. PLT 251. No signs/symptoms of bleeding noted in chart.  Goal of Therapy:  Heparin  level 0.3-0.7 units/ml Monitor platelets by anticoagulation protocol: Yes   Plan:  Give 2500 unit bolus x 1 Start heparin  infusion at a rate of 500 units/hr Check heparin  level in 8 hours Monitor CBC daily while on heparin   Lum VEAR Mania, PharmD, BCPS 03/29/2024,6:33 PM      [1]  Allergies Allergen Reactions   Wellbutrin [Bupropion]     hives

## 2024-03-29 NOTE — Consult Note (Signed)
 "  Cardiology Consultation   Patient ID: Tiffany Munoz MRN: [id number]; DOB: 13-Feb-1946  Admit date: 03/29/2024 Date of Consult: 03/29/2024  PCP:  Glover Lenis, MD   [facility] Providers Cardiologist:  Redell Cave, MD        Patient Profile: Tiffany Munoz is a 78 y.o. female with a hx of coronary artery disease who is being seen 03/29/2024 for the evaluation of non-STEMI at the request of Dr.Khan.  History of Present Illness: Tiffany Munoz is a 78 year old female with known history of coronary artery disease, hypertension, hyperlipidemia, thoracic and abdominal aortic aneurysm and tobacco use. She was hospitalized in April 2024 with non-STEMI.  Cardiac catheterization was performed by me which showed severe left circumflex disease with occluded posterior AV groove branch that was the culprit but small and left to be treated medically.  2 stents were placed to the left circumflex.  There was moderate proximal LAD disease that was not significant by fractional flow reserve evaluation.  In addition, there was moderate RCA disease.  She did well post PCI and was treated with DAPT for 1 year. She presented with substernal chest pain and tightness that started early morning and lasted for hours.  She was given nitroglycerin  and the pain is minimal now.  Her troponin was noted to be mildly elevated.   Past Medical History:  Diagnosis Date   AAA (abdominal aortic aneurysm)    CAD (coronary artery disease)    Emphysema of lung (HCC)    HLD (hyperlipidemia)    Hypertension    Migraine     Past Surgical History:  Procedure Laterality Date   CESAREAN SECTION     CORONARY PRESSURE/FFR STUDY N/A 07/11/2022   Procedure: INTRAVASCULAR PRESSURE WIRE/FFR STUDY;  Surgeon: Darron Deatrice LABOR, MD;  Location: [facility] INVASIVE CV LAB;  Service: Cardiovascular;  Laterality: N/A;   CORONARY STENT INTERVENTION N/A 07/11/2022   Procedure: CORONARY STENT INTERVENTION;  Surgeon: Darron Deatrice LABOR,  MD;  Location: [facility] INVASIVE CV LAB;  Service: Cardiovascular;  Laterality: N/A;   EXTERNAL EAR SURGERY     LEFT HEART CATH AND CORONARY ANGIOGRAPHY N/A 07/11/2022   Procedure: LEFT HEART CATH AND CORONARY ANGIOGRAPHY;  Surgeon: Darron Deatrice LABOR, MD;  Location: [facility] INVASIVE CV LAB;  Service: Cardiovascular;  Laterality: N/A;   TYMPANOSTOMY TUBE PLACEMENT     UTERINE FIBROID SURGERY       Home Medications:  Prior to Admission medications  Medication Sig Start Date End Date Taking? Authorizing Provider  amLODipine  (NORVASC ) 5 MG tablet Take 0.5 tablets (2.5 mg total) by mouth in the morning and at bedtime. 05/26/23  Yes Cave Redell, MD  aspirin  81 MG tablet Take 2 tablets by mouth daily. 09/19/07  Yes [provider]  carvedilol  (COREG ) 6.25 MG tablet Take by mouth. 04/11/23  Yes [provider]  cholecalciferol  (VITAMIN D3) 25 MCG (1000 UNIT) tablet Take 500 Units by mouth daily. Patient taking differently: Take 1,000 Units by mouth daily.   Yes [provider]  clobetasol cream (TEMOVATE) 0.05 % Apply 1 Application topically 3 (three) times daily as needed. 05/10/22  Yes [provider]  estradiol  (ESTRACE ) 0.01 % CREA vaginal cream Apply one pea-sized amount around the opening of the urethra daily for 2 weeks, then 3 times weekly moving forward. 03/14/24  Yes Aiden, Marry KIDD, PA-C  ezetimibe  (ZETIA ) 10 MG tablet Take 1 tablet (10 mg total) by mouth daily. 08/14/23 03/29/24 Yes Dunn, Bernardino HERO, PA-C  Multiple Vitamin (MULTIVITAMIN) tablet  Take 1 tablet by mouth daily.   Yes [provider]  potassium chloride  (MICRO-K ) 10 MEQ CR capsule Take 10 mEq by mouth daily. 07/08/23  Yes [provider]  REPATHA  SURECLICK 140 MG/ML SOAJ ADMINISTER 1 ML UNDER THE SKIN EVERY 14 DAYS 10/02/23  Yes Agbor-Etang, Redell, MD  nitroGLYCERIN  (NITROSTAT ) 0.4 MG SL tablet Place 1 tablet (0.4 mg total) under the tongue every 5 (five) minutes x 3 doses as needed for  chest pain. Patient not taking: Reported on 03/29/2024 07/12/22   Dorinda Drue DASEN, MD    Scheduled Meds:  heparin   5,000 Units Subcutaneous Q8H   sodium chloride  flush  3 mL Intravenous Q12H   Continuous Infusions:  PRN Meds: acetaminophen  **OR** acetaminophen , ondansetron  **OR** ondansetron  (ZOFRAN ) IV, senna-docusate  Allergies:   Allergies[1]  Social History:   Social History   Socioeconomic History   Marital status: Legally Separated    Spouse name: Not on file   Number of children: 1   Years of education: Not on file   Highest education level: Not on file  Occupational History   Not on file  Tobacco Use   Smoking status: Every Day    Current packs/day: 0.50    Average packs/day: 0.5 packs/day for 55.0 years (27.5 ttl pk-yrs)    Types: Cigarettes   Smokeless tobacco: Never   Tobacco comments:    5 cigarettes a day working toward cessation  Vaping Use   Vaping status: Never Used  Substance and Sexual Activity   Alcohol use: Yes    Alcohol/week: 2.0 standard drinks of alcohol    Types: 2 Cans of beer per week   Drug use: Never   Sexual activity: Not on file  Other Topics Concern   Not on file  Social History Narrative   Not on file   Social Drivers of Health   Tobacco Use: High Risk (03/29/2024)   Patient History    Smoking Tobacco Use: Every Day    Smokeless Tobacco Use: Never    Passive Exposure: Not on file  Financial Resource Strain: Patient Declined (11/08/2022)   Received from [facility]   Overall Financial Resource Strain (CARDIA)    Difficulty of Paying Living Expenses: Patient declined  Food Insecurity: Patient Declined (11/08/2022)   Received from [facility]   Epic    Within the past 12 months, you worried that your food would run out before you got the money to buy more.: Patient declined    Within the past 12 months, the food you bought just didn't last and you didn't have money to get more.: Patient declined   Transportation Needs: Patient Declined (11/08/2022)   Received from [facility] - Transportation    In the past 12 months, has lack of transportation kept you from medical appointments or from getting medications?: Patient declined    Lack of Transportation (Non-Medical): Patient declined  Physical Activity: Not on file  Stress: Not on file  Social Connections: Not on file  Intimate Partner Violence: Not At Risk (07/08/2022)   Humiliation, Afraid, Rape, and Kick questionnaire    Fear of Current or Ex-Partner: No    Emotionally Abused: No    Physically Abused: No    Sexually Abused: No  Depression (PHQ2-9): Medium Risk (11/10/2022)   Depression (PHQ2-9)    PHQ-2 Score: 7  Alcohol Screen: Not on file  Housing: Unknown (05/15/2023)   Received from [facility]  Epic    In the last 12 months, was there a time when you were not able to pay the mortgage or rent on time?: Patient declined    Number of Times Moved in the Last Year: Not on file    At any time in the past 12 months, were you homeless or living in a shelter (including now)?: No  Utilities: Patient Declined (11/08/2022)   Received from [facility] Utilities    Threatened with loss of utilities: Patient declined  Health Literacy: Not on file    Family History:    Family History  Problem Relation Age of Onset   Heart disease Mother    Heart disease Father    Stroke Sister    Hypertension Brother    Cancer Brother    Hemochromatosis Brother    Gout Brother      ROS:  Please see the history of present illness.   All other ROS reviewed and negative.     Physical Exam/Data: Vitals:   03/29/24 0641 03/29/24 1527  BP: (!) 151/87   Pulse: 61   Resp: 16   Temp: 97.9 F (36.6 C) 97.9 F (36.6 C)  TempSrc: Oral Oral  SpO2: 100%   Weight: 44.5 kg   Height: 5' 5 (1.651 m)    No intake or output data in the 24 hours ending 03/29/24 1603     03/29/2024    6:41 AM 03/14/2024    1:59 PM 01/23/2024    3:43 PM  Last 3 Weights  Weight (lbs) 98 lb 99 lb 104 lb  Weight (kg) 44.453 kg 44.906 kg 47.174 kg     Body mass index is 16.31 kg/m.  General:  Well nourished, well developed, in no acute distress HEENT: normal Neck: no JVD Vascular: No carotid bruits; Distal pulses 2+ bilaterally Cardiac:  normal S1, S2; RRR; no murmur  Lungs:  clear to auscultation bilaterally, no wheezing, rhonchi or rales  Abd: soft, nontender, no hepatomegaly  Ext: no edema Musculoskeletal:  No deformities, BUE and BLE strength normal and equal Skin: warm and dry  Neuro:  CNs 2-12 intact, no focal abnormalities noted Psych:  Normal affect   EKG:  The EKG was personally reviewed and demonstrates: Sinus rhythm with no significant ST changes Telemetry:  Telemetry was personally reviewed and demonstrates:    Relevant CV Studies: Echocardiogram was being done at the bedside and showed normal LV systolic function with no obvious wall motion abnormalities.  Laboratory Data: High Sensitivity Troponin:  No results for input(s): TROPONINIHS in the last 720 hours.  Recent Labs  Lab 03/29/24 0645 03/29/24 0845 03/29/24 1522  TRNPT 16 40* 54*      Chemistry Recent Labs  Lab 03/29/24 0645  NA 138  K 3.7  CL 104  CO2 26  GLUCOSE 96  BUN 26*  CREATININE 0.79  CALCIUM  9.5  GFRNONAA >60  ANIONGAP 9    No results for input(s): PROT, ALBUMIN, AST, ALT, ALKPHOS, BILITOT in the last 168 hours. Lipids No results for input(s): CHOL, TRIG, HDL, LABVLDL, LDLCALC, CHOLHDL in the last 168 hours.  Hematology Recent Labs  Lab 03/29/24 0645  WBC 9.3  RBC 4.54  HGB 13.6  HCT 41.8  MCV 92.1  MCH 30.0  MCHC 32.5  RDW 14.1  PLT 251   Thyroid No results for input(s): TSH, FREET4 in the last 168 hours.  BNPNo results for input(s): BNP, PROBNP in the last 168 hours.  DDimer No results for input(s): DDIMER in the last  168 hours.  Radiology/Studies:  DG Chest 2 View Result Date: 03/29/2024 EXAM: 2 VIEW(S) XRAY OF THE CHEST 03/29/2024 07:05:49 AM COMPARISON: CT of the chest 07/08/2022. CLINICAL HISTORY: Chest pain. FINDINGS: LUNGS AND PLEURA: Hyperinflation. No focal pulmonary opacity. No pleural effusion. No pneumothorax. HEART AND MEDIASTINUM: Tortuous and ectatic aorta. Double density along left mediastinum, corresponding to known descending thoracic aortic aneurysm. BONES AND SOFT TISSUES: No acute osseous abnormality. IMPRESSION: 1. No acute findings. 2. Tortuous and ectatic aorta with known descending thoracic aortic aneurysm. Electronically signed by: Waddell Calk MD 03/29/2024 07:09 AM EST RP Workstation: [id number]     Assessment and Plan: Non-STEMI in a patient with known history of coronary artery disease: Recommend heparin  drip and aspirin .  Recommend proceeding with left heart catheterization and possible PCI on Monday.  I discussed the procedure in details as well as risks and benefits.  Planned access is via the right radial artery. Orders placed.  Hyperlipidemia with intolerance to statins: She is on Repatha  as an outpatient. Essential hypertension with labile blood pressure: Continue amlodipine  and carvedilol . 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  This is [facility].  Informed Consent   Shared Decision Making/Informed Consent The risks [stroke (1 in 1000), death (1 in 1000), kidney failure [usually temporary] (1 in 500), bleeding (1 in 200), allergic reaction [possibly serious] (1 in 200)], benefits (diagnostic support and management of coronary artery disease) and alternatives of a cardiac catheterization were discussed in detail with Ms. Hagey and she is willing to proceed.       Risk Assessment/Risk Scores:    TIMI Risk Score for Unstable Angina or Non-ST Elevation MI:   The patient's TIMI risk score is 5,  which indicates a 26% risk of all cause mortality, new or recurrent myocardial infarction or need for urgent revascularization in the next 14 days.       For questions or updates, please contact [facility] Please consult [email or web address] for contact info under    Signed, Deatrice Cage, MD  03/29/2024 4:03 PM     [1]  Allergies Allergen Reactions   Wellbutrin [Bupropion]     hives   "

## 2024-03-29 NOTE — H&P (Signed)
 " History and Physical    Tiffany Munoz FMW:[id number] DOB: 10/20/1945 DOA: 03/29/2024  DOS: the patient was seen and examined on 03/29/2024  PCP: Glover Lenis, MD   Patient coming from: Home  I have personally briefly reviewed patient's old medical records in [facility] and CareEverywhere  HPI:   Tiffany Munoz is a 78 y.o. year old female with medical history of HTN, HLD, CAD c/b NSTEMI s/p stent placement in 07/2022, thoracic and abdominal aortic aneurysm presenting to the ED with chest pain.   Patient reports her chest pain started early this morning around 430am. States she has been having trouble sleeping lately and was awake at the time this occurred.  She describes it as a pressure-like sensation. Rates it 3/4 out of 10.  She denies any associated symptoms of lightheadedness or dizziness or shortness of breath.  She denies any URI symptoms.  On arrival to the ED patient was noted to be HDS stable.  Lab work and imaging obtained.  CBC unremarkable, BMP overall unremarkable.  Troponin initially within normal limits but repeat troponin elevated at 40.  Further troponins are pending.  Chest x-ray without any acute findings.  Given patient cardiac history, TRH contacted for admission.  Review of Systems: As mentioned in the history of present illness. All other systems reviewed and are negative.   Past Medical History:  Diagnosis Date   AAA (abdominal aortic aneurysm)    CAD (coronary artery disease)    Emphysema of lung (HCC)    HLD (hyperlipidemia)    Hypertension    Migraine     Past Surgical History:  Procedure Laterality Date   CESAREAN SECTION     CORONARY PRESSURE/FFR STUDY N/A 07/11/2022   Procedure: INTRAVASCULAR PRESSURE WIRE/FFR STUDY;  Surgeon: Darron Deatrice LABOR, MD;  Location: [facility] INVASIVE CV LAB;  Service: Cardiovascular;  Laterality: N/A;   CORONARY STENT INTERVENTION N/A 07/11/2022   Procedure: CORONARY STENT INTERVENTION;  Surgeon: Darron Deatrice LABOR,  MD;  Location: [facility] INVASIVE CV LAB;  Service: Cardiovascular;  Laterality: N/A;   EXTERNAL EAR SURGERY     LEFT HEART CATH AND CORONARY ANGIOGRAPHY N/A 07/11/2022   Procedure: LEFT HEART CATH AND CORONARY ANGIOGRAPHY;  Surgeon: Darron Deatrice LABOR, MD;  Location: [facility] INVASIVE CV LAB;  Service: Cardiovascular;  Laterality: N/A;   TYMPANOSTOMY TUBE PLACEMENT     UTERINE FIBROID SURGERY       Allergies[1]  Family History  Problem Relation Age of Onset   Heart disease Mother    Heart disease Father    Stroke Sister    Hypertension Brother    Cancer Brother    Hemochromatosis Brother    Gout Brother     Prior to Admission medications  Medication Sig Start Date End Date Taking? Authorizing Provider  amLODipine  (NORVASC ) 5 MG tablet Take 0.5 tablets (2.5 mg total) by mouth in the morning and at bedtime. 05/26/23   Darliss Rogue, MD  aspirin  81 MG tablet Take 2 tablets by mouth daily. 09/19/07   [provider]  carvedilol  (COREG ) 6.25 MG tablet Take by mouth. 04/11/23   [provider]  cholecalciferol (VITAMIN D3) 25 MCG (1000 UNIT) tablet Take 500 Units by mouth daily. Patient taking differently: Take 1,000 Units by mouth daily.    [provider]  clobetasol cream (TEMOVATE) 0.05 % Apply 1 Application topically 3 (three) times daily as needed. 05/10/22   [provider]  estradiol  (ESTRACE ) 0.01 % CREA vaginal cream Apply one pea-sized amount  around the opening of the urethra daily for 2 weeks, then 3 times weekly moving forward. 03/14/24   Aiden, Marry KIDD, PA-C  ezetimibe  (ZETIA ) 10 MG tablet Take 1 tablet (10 mg total) by mouth daily. 08/14/23 03/14/24  Abigail Bernardino HERO, PA-C  Multiple Vitamin (MULTIVITAMIN) tablet Take 1 tablet by mouth daily.    [provider]  nitroGLYCERIN  (NITROSTAT ) 0.4 MG SL tablet Place 1 tablet (0.4 mg total) under the tongue every 5 (five) minutes x 3 doses as needed for chest pain. 07/12/22   Dorinda Drue DASEN, MD   potassium chloride  (MICRO-K ) 10 MEQ CR capsule Take 10 mEq by mouth daily. 07/08/23   [provider]  REPATHA  SURECLICK 140 MG/ML SOAJ ADMINISTER 1 ML UNDER THE SKIN EVERY 14 DAYS 10/02/23   Darliss Rogue, MD    Social History:  reports that she has been smoking cigarettes. She has a 27.5 pack-year smoking history. She has never used smokeless tobacco. She reports current alcohol use of about 2.0 standard drinks of alcohol per week. She reports that she does not use drugs.    Physical Exam: Vitals:   03/29/24 0641  BP: (!) 151/87  Pulse: 61  Resp: 16  Temp: 97.9 F (36.6 C)  TempSrc: Oral  SpO2: 100%  Weight: 44.5 kg  Height: 5' 5 (1.651 m)    Gen: NAD HENT: NCAT CV: normal heart sounds Lung: CTAB Abd: No TTP, normal bowel sounds MSK: No asymmetry, good bulk and tone Neuro: alert and oriented   Labs on Admission: I have personally reviewed following labs and imaging studies  CBC: Recent Labs  Lab 03/29/24 0645  WBC 9.3  HGB 13.6  HCT 41.8  MCV 92.1  PLT 251   Basic Metabolic Panel: Recent Labs  Lab 03/29/24 0645  NA 138  K 3.7  CL 104  CO2 26  GLUCOSE 96  BUN 26*  CREATININE 0.79  CALCIUM  9.5   GFR: Estimated Creatinine Clearance: 42 mL/min (by C-G formula based on SCr of 0.79 mg/dL). Liver Function Tests: No results for input(s): AST, ALT, ALKPHOS, BILITOT, PROT, ALBUMIN in the last 168 hours. No results for input(s): LIPASE, AMYLASE in the last 168 hours. No results for input(s): AMMONIA in the last 168 hours. Coagulation Profile: No results for input(s): INR, PROTIME in the last 168 hours. Cardiac Enzymes: No results for input(s): CKTOTAL, CKMB, CKMBINDEX, TROPONINI, TROPONINIHS in the last 168 hours. BNP (last 3 results) No results for input(s): BNP in the last 8760 hours. HbA1C: No results for input(s): HGBA1C in the last 72 hours. CBG: No results for input(s): GLUCAP in the last 168  hours. Lipid Profile: No results for input(s): CHOL, HDL, LDLCALC, TRIG, CHOLHDL, LDLDIRECT in the last 72 hours. Thyroid Function Tests: No results for input(s): TSH, T4TOTAL, FREET4, T3FREE, THYROIDAB in the last 72 hours. Anemia Panel: No results for input(s): VITAMINB12, FOLATE, FERRITIN, TIBC, IRON, RETICCTPCT in the last 72 hours. Urine analysis:    Component Value Date/Time   COLORURINE YELLOW (A) 09/10/2018 1729   APPEARANCEUR Clear 03/14/2024 1349   LABSPEC 1.013 09/10/2018 1729   PHURINE 5.0 09/10/2018 1729   GLUCOSEU Negative 03/14/2024 1349   HGBUR NEGATIVE 09/10/2018 1729   BILIRUBINUR Negative 03/14/2024 1349   KETONESUR NEGATIVE 09/10/2018 1729   PROTEINUR Negative 03/14/2024 1349   PROTEINUR NEGATIVE 09/10/2018 1729   NITRITE Negative 03/14/2024 1349   NITRITE NEGATIVE 09/10/2018 1729   LEUKOCYTESUR Trace (A) 03/14/2024 1349   LEUKOCYTESUR NEGATIVE 09/10/2018 1729  Radiological Exams on Admission: I have personally reviewed images DG Chest 2 View Result Date: 03/29/2024 EXAM: 2 VIEW(S) XRAY OF THE CHEST 03/29/2024 07:05:49 AM COMPARISON: CT of the chest 07/08/2022. CLINICAL HISTORY: Chest pain. FINDINGS: LUNGS AND PLEURA: Hyperinflation. No focal pulmonary opacity. No pleural effusion. No pneumothorax. HEART AND MEDIASTINUM: Tortuous and ectatic aorta. Double density along left mediastinum, corresponding to known descending thoracic aortic aneurysm. BONES AND SOFT TISSUES: No acute osseous abnormality. IMPRESSION: 1. No acute findings. 2. Tortuous and ectatic aorta with known descending thoracic aortic aneurysm. Electronically signed by: Waddell Calk MD 03/29/2024 07:09 AM EST RP Workstation: [id number]    EKG: My personal interpretation of EKG shows: EKG with sinus bradycardia without any acute ST changes.  There is some artifact.  So we will repeat   Assessment/Plan Principal Problem:   Chest pain due to CAD Active  Problems:   Benign essential hypertension   Mixed hyperlipidemia   Abdominal aortic aneurysm (AAA) without rupture   Thoracic aortic aneurysm   Patient with significant cardiac history including NSTEMI status post stent placement presented to the ED after chest pain.  Chest pain has improved since onset but states she is having some chest pressure.  Troponin was mildly elevated so we will continue to trend this.  Will also obtain echocardiogram to look for any wall motion abnormality.  Given her history, we will also obtain cardiology consultation.  Given that her pain has improved and her troponin is mildly elevated we will hold off on initiating heparin  drip currently.  Hypertension: Patient's initial blood pressure was elevated at 151/87.  States blood pressure in the am was 165/100. States she missed her BP meds in the evening. Due to question of elevated blood pressure was playing a role into her chest pain and the troponin elevation.  Will restart home medications here.  Will start with carvedilol  and if troponin remains stable then we can add amlodipine .  Hyperlipidemia: Patient is on Repatha  and Zetia .  He is statin intolerant.  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 Monitored by vascular surgery. Last visit in 11/2023. Pain is not typical for dissection but if she has persisting/worsening pain, will order CTA.   Tobacco use disorder: 1/2 ppd. Counseling regarding complete tobacco cessation provided.   VTE prophylaxis:  SQ Heparin   Diet: Heart healthy Code Status:  Full Code Telemetry:  Admission status: Observation, Telemetry bed Patient is from: Home Anticipated d/c is to: Home Anticipated d/c is in: 1-2 days   Family Communication: Updated at bedside  Consults called: Cardiology   Severity of Illness: The appropriate patient status for this patient is OBSERVATION. Observation status is judged to be reasonable and necessary in order to provide the required  intensity of service to ensure the patient's safety. The patient's presenting symptoms, physical exam findings, and initial radiographic and laboratory data in the context of their medical condition is felt to place them at decreased risk for further clinical deterioration. Furthermore, it is anticipated that the patient will be medically stable for discharge from the hospital within 2 midnights of admission.    Morene Bathe, MD Jolynn DEL. [facility]      [1]  Allergies Allergen Reactions   Wellbutrin [Bupropion]     hives   "

## 2024-03-29 NOTE — ED Provider Notes (Signed)
 "  [facility] Provider Note    Event Date/Time   First MD Initiated Contact with Patient 03/29/24 1005     (approximate)   History   Chest Pain   HPI  Tiffany Munoz is a 78 y.o. female with a history of recurrent UTIs, hypertension, hyperlipidemia, who presents with chest pain, around 3 to 4 AM, described as pressure and aching.  She took a total of 324 mg of aspirin .  She reports some improvement in the pain now although she still feels some pressure.  She denies feeling dizzy or lightheaded.  She has no shortness of breath.  She denies any nausea or vomiting.  She reports that this is similar to a prior MI she had in the spring.  She denies any leg pain or swelling.  I reviewed the past medical records per the patient's most recent outpatient encounter was on 12/4 with urology for recurrent urinary tract infections.  She recently completed a course of Macrobid at the end of November.   Physical Exam   Triage Vital Signs: ED Triage Vitals [03/29/24 0641]  Encounter Vitals Group     BP (!) 151/87     Girls Systolic BP Percentile      Girls Diastolic BP Percentile      Boys Systolic BP Percentile      Boys Diastolic BP Percentile      Pulse Rate 61     Resp 16     Temp 97.9 F (36.6 C)     Temp Source Oral     SpO2 100 %     Weight 98 lb (44.5 kg)     Height 5' 5 (1.651 m)     Head Circumference      Peak Flow      Pain Score 5     Pain Loc      Pain Education      Exclude from Growth Chart     Most recent vital signs: Vitals:   03/29/24 0641 03/29/24 1527  BP: (!) 151/87   Pulse: 61   Resp: 16   Temp: 97.9 F (36.6 C) 97.9 F (36.6 C)  SpO2: 100%      General: Alert, relatively well-appearing, no distress.  CV:  Good peripheral perfusion.  Normal heart sounds. Resp:  Normal effort.  Lungs CTAB. Abd:  No distention.  Other:  Peripheral edema.   ED Results / Procedures / Treatments   Labs (all labs ordered are listed, but  only abnormal results are displayed) Labs Reviewed  BASIC METABOLIC PANEL WITH GFR - Abnormal; Notable for the following components:      Result Value   BUN 26 (*)    All other components within normal limits  TROPONIN T, HIGH SENSITIVITY - Abnormal; Notable for the following components:   Troponin T High Sensitivity 40 (*)    All other components within normal limits  TROPONIN T, HIGH SENSITIVITY - Abnormal; Notable for the following components:   Troponin T High Sensitivity 54 (*)    All other components within normal limits  CBC  MAGNESIUM  TROPONIN T, HIGH SENSITIVITY  TROPONIN T, HIGH SENSITIVITY     EKG  ED ECG REPORT I, Waylon Cassis, the attending physician, personally viewed and interpreted this ECG.  Date: 03/29/2024 EKG Time: 0643 Rate: 59 Rhythm: normal sinus rhythm QRS Axis: normal Intervals: normal ST/T Wave abnormalities: normal Narrative Interpretation: no evidence of acute ischemia    RADIOLOGY  Chest x-ray: I  independently viewed and interpreted the images; there is no focal consolidation or edema   PROCEDURES:  Critical Care performed: No  Procedures   MEDICATIONS ORDERED IN ED: Medications  sodium chloride  flush (NS) 0.9 % injection 3 mL (3 mLs Intravenous Given 03/29/24 1532)  acetaminophen  (TYLENOL ) tablet 650 mg (has no administration in time range)    Or  acetaminophen  (TYLENOL ) suppository 650 mg (has no administration in time range)  senna-docusate (Senokot-S) tablet 1 tablet (has no administration in time range)  heparin  injection 5,000 Units (5,000 Units Subcutaneous Given 03/29/24 1513)  ondansetron  (ZOFRAN ) tablet 4 mg (has no administration in time range)    Or  ondansetron  (ZOFRAN ) injection 4 mg (has no administration in time range)  nitroGLYCERIN  (NITROSTAT ) SL tablet 0.4 mg (0.4 mg Sublingual Given 03/29/24 1527)     IMPRESSION / MDM / ASSESSMENT AND PLAN / ED COURSE  I reviewed the triage vital signs and the  nursing notes.  78 year old female with PMH as noted above including prior CAD history presents with nonexertional chest pain that started several hours ago and is now improved but still present.  On exam she is hypertensive.  Other vital signs are normal.  Physical exam is unremarkable for acute findings.  EKG does not show any acute changes.  Differential diagnosis includes, but is not limited to, ACS, musculoskeletal pain, GERD.  Since the pain has significantly improved, I have a low suspicion for PE or for aortic dissection or other vascular etiology.  She has a known thoracic aortic aneurysm which has been stable.  Patient's presentation is most consistent with acute presentation with potential threat to life or bodily function.  The patient is on the cardiac monitor to evaluate for evidence of arrhythmia and/or significant heart rate changes.   ----------------------------------------- 1:32 PM on 03/29/2024 -----------------------------------------  Initial troponin was normal, however the repeat was elevated somewhat.  Given the patient's increased risk of ACS with her prior cardiac history, she will need further workup and monitoring to rule out NSTEMI.  I consulted Dr. Fernand from the hospitalist service; based on our discussion he agrees to evaluate the patient for admission.   FINAL CLINICAL IMPRESSION(S) / ED DIAGNOSES   Final diagnoses:  Chest pain, unspecified type  Elevated troponin     Rx / DC Orders   ED Discharge Orders     None        Note:  This document was prepared using Dragon voice recognition software and may include unintentional dictation errors.    Jacolyn Pae, MD 03/29/24 1552  "

## 2024-03-29 NOTE — ED Triage Notes (Addendum)
 Pt presents for chest pressure, described as a general ache. Took total 324 mg aspirin  prior to arrival. Denies nausea, vomiting, SOB. States this feels the same as the MI she had in March 2025. VSS (150/78, 66, 99% on RA, CBG 88). Pt states sometimes the pain radiates to my back but then goes away again. Hx abdominal aortic aneurysm

## 2024-03-30 DIAGNOSIS — R7989 Other specified abnormal findings of blood chemistry: Secondary | ICD-10-CM | POA: Diagnosis present

## 2024-03-30 DIAGNOSIS — Z635 Disruption of family by separation and divorce: Secondary | ICD-10-CM | POA: Diagnosis not present

## 2024-03-30 DIAGNOSIS — I25119 Atherosclerotic heart disease of native coronary artery with unspecified angina pectoris: Secondary | ICD-10-CM | POA: Diagnosis not present

## 2024-03-30 DIAGNOSIS — I712 Thoracic aortic aneurysm, without rupture, unspecified: Secondary | ICD-10-CM | POA: Diagnosis not present

## 2024-03-30 DIAGNOSIS — Z720 Tobacco use: Secondary | ICD-10-CM | POA: Diagnosis not present

## 2024-03-30 DIAGNOSIS — F17210 Nicotine dependence, cigarettes, uncomplicated: Secondary | ICD-10-CM | POA: Diagnosis present

## 2024-03-30 DIAGNOSIS — I7121 Aneurysm of the ascending aorta, without rupture: Secondary | ICD-10-CM | POA: Diagnosis not present

## 2024-03-30 DIAGNOSIS — R079 Chest pain, unspecified: Secondary | ICD-10-CM | POA: Diagnosis not present

## 2024-03-30 DIAGNOSIS — Z87440 Personal history of urinary (tract) infections: Secondary | ICD-10-CM | POA: Diagnosis not present

## 2024-03-30 DIAGNOSIS — Z8349 Family history of other endocrine, nutritional and metabolic diseases: Secondary | ICD-10-CM | POA: Diagnosis not present

## 2024-03-30 DIAGNOSIS — I2510 Atherosclerotic heart disease of native coronary artery without angina pectoris: Secondary | ICD-10-CM | POA: Diagnosis present

## 2024-03-30 DIAGNOSIS — I739 Peripheral vascular disease, unspecified: Secondary | ICD-10-CM | POA: Diagnosis present

## 2024-03-30 DIAGNOSIS — Z888 Allergy status to other drugs, medicaments and biological substances status: Secondary | ICD-10-CM | POA: Diagnosis not present

## 2024-03-30 DIAGNOSIS — Z8249 Family history of ischemic heart disease and other diseases of the circulatory system: Secondary | ICD-10-CM | POA: Diagnosis not present

## 2024-03-30 DIAGNOSIS — Z955 Presence of coronary angioplasty implant and graft: Secondary | ICD-10-CM | POA: Diagnosis not present

## 2024-03-30 DIAGNOSIS — E782 Mixed hyperlipidemia: Secondary | ICD-10-CM

## 2024-03-30 DIAGNOSIS — I7122 Aneurysm of the aortic arch, without rupture: Secondary | ICD-10-CM | POA: Diagnosis present

## 2024-03-30 DIAGNOSIS — Z79899 Other long term (current) drug therapy: Secondary | ICD-10-CM | POA: Diagnosis not present

## 2024-03-30 DIAGNOSIS — Z7982 Long term (current) use of aspirin: Secondary | ICD-10-CM | POA: Diagnosis not present

## 2024-03-30 DIAGNOSIS — Z823 Family history of stroke: Secondary | ICD-10-CM | POA: Diagnosis not present

## 2024-03-30 DIAGNOSIS — I10 Essential (primary) hypertension: Secondary | ICD-10-CM | POA: Diagnosis present

## 2024-03-30 DIAGNOSIS — I214 Non-ST elevation (NSTEMI) myocardial infarction: Secondary | ICD-10-CM | POA: Diagnosis present

## 2024-03-30 DIAGNOSIS — J439 Emphysema, unspecified: Secondary | ICD-10-CM | POA: Diagnosis present

## 2024-03-30 LAB — BASIC METABOLIC PANEL WITH GFR
Anion gap: 9 (ref 5–15)
BUN: 23 mg/dL (ref 8–23)
CO2: 26 mmol/L (ref 22–32)
Calcium: 9.1 mg/dL (ref 8.9–10.3)
Chloride: 105 mmol/L (ref 98–111)
Creatinine, Ser: 0.76 mg/dL (ref 0.44–1.00)
GFR, Estimated: >60 mL/min
Glucose, Bld: 82 mg/dL (ref 70–99)
Potassium: 3.7 mmol/L (ref 3.5–5.1)
Sodium: 139 mmol/L (ref 135–145)

## 2024-03-30 LAB — CBC
HCT: 39 % (ref 36.0–46.0)
Hemoglobin: 13 g/dL (ref 12.0–15.0)
MCH: 30 pg (ref 26.0–34.0)
MCHC: 33.3 g/dL (ref 30.0–36.0)
MCV: 89.9 fL (ref 80.0–100.0)
Platelets: 250 K/uL (ref 150–400)
RBC: 4.34 MIL/uL (ref 3.87–5.11)
RDW: 14 % (ref 11.5–15.5)
WBC: 8.7 K/uL (ref 4.0–10.5)
nRBC: 0 % (ref 0.0–0.2)

## 2024-03-30 LAB — HEPARIN LEVEL (UNFRACTIONATED)
Heparin Unfractionated: <0.1 [IU]/mL — ABNORMAL LOW (ref 0.30–0.70)
Heparin Unfractionated: 0.18 [IU]/mL — ABNORMAL LOW (ref 0.30–0.70)

## 2024-03-30 LAB — LIPID PANEL
Cholesterol: 141 mg/dL (ref 0–200)
HDL: 58 mg/dL
LDL Cholesterol: 71 mg/dL (ref 0–99)
Total CHOL/HDL Ratio: 2.4 ratio
Triglycerides: 59 mg/dL
VLDL: 12 mg/dL (ref 0–40)

## 2024-03-30 LAB — TROPONIN T, HIGH SENSITIVITY
Troponin T High Sensitivity: 135 ng/L (ref 0–19)
Troponin T High Sensitivity: 140 ng/L (ref 0–19)
Troponin T High Sensitivity: 149 ng/L (ref 0–19)
Troponin T High Sensitivity: 167 ng/L (ref 0–19)
Troponin T High Sensitivity: 167 ng/L (ref 0–19)

## 2024-03-30 MED ORDER — HEPARIN BOLUS VIA INFUSION
1350.0000 [IU] | Freq: Once | INTRAVENOUS | Status: AC
  Administered 2024-03-30: 1350 [IU] via INTRAVENOUS
  Filled 2024-03-30: qty 1350

## 2024-03-30 MED ORDER — HEPARIN BOLUS VIA INFUSION
650.0000 [IU] | Freq: Once | INTRAVENOUS | Status: AC
  Administered 2024-03-30: 650 [IU] via INTRAVENOUS
  Filled 2024-03-30: qty 650

## 2024-03-30 NOTE — Progress Notes (Signed)
 PHARMACY - ANTICOAGULATION CONSULT NOTE  Pharmacy Consult for Heparin  infusion Indication: chest pain/ACS  Allergies[1]  Patient Measurements: Height: 5' 5 (165.1 cm) Weight: 46 kg (101 lb 6.6 oz) IBW/kg (Calculated) : 57 HEPARIN  DW (KG): 44.5  Vital Signs: Temp: 98.2 F (36.8 C) (12/20 1242) Temp Source: Oral (12/20 0347) BP: 122/60 (12/20 1242) Pulse Rate: 63 (12/20 1242)  Labs: Recent Labs    03/29/24 0645 03/29/24 2002 03/30/24 0429 03/30/24 1504  HGB 13.6  --  13.0  --   HCT 41.8  --  39.0  --   PLT 251  --  250  --   APTT  --  187*  --   --   LABPROT  --  15.8*  --   --   INR  --  1.2  --   --   HEPARINUNFRC  --   --  <0.10* 0.18*  CREATININE 0.79  --  0.76  --     Estimated Creatinine Clearance: 43.4 mL/min (by C-G formula based on SCr of 0.76 mg/dL).   Medical History: Past Medical History:  Diagnosis Date   AAA (abdominal aortic aneurysm)    CAD (coronary artery disease)    Emphysema of lung (HCC)    HLD (hyperlipidemia)    Hypertension    Migraine     Medications:  Not on anticoagulation at home per med rec and chart review Heparin  SQ for DVT ppx given 12/19 @ 1513  Assessment: Patient is a 78 year old female with a past medical history of HTN, HLD, CAD c/b NSTEMI s/p stent placement in 07/2022, thoracic and abdominal aortic aneurysm who presented to ED with chest pain. Troponin level 40>54>66. Pharmacy has been consulted to initiate patient on a heparin  infusion for ACS/STEMI.   Baseline INR and aPTT ordered. Hgb 13.6. PLT 251. No signs/symptoms of bleeding noted in chart.  12/20:  HL @ 0429 = < 0.1, SUBtherapeutic 12/20: HL @ 1504 = 0.18, SUBtherapeutic  Goal of Therapy:  Heparin  level 0.3-0.7 units/ml Monitor platelets by anticoagulation protocol: Yes   Plan:  - Heparin  level subtherapeutic @ 0.18 - Will order heparin  650 units IV X 1 bolus and increased heparin  drip rate to 750 units/hr  - Recheck HL 8 hrs after rate change  -  Monitor CBC daily while on heparin   Lum VEAR Mania, PharmD 03/30/2024,3:27 PM        [1]  Allergies Allergen Reactions   Wellbutrin [Bupropion]     hives

## 2024-03-30 NOTE — Assessment & Plan Note (Signed)
 Troponin up to 176.  Patient on aspirin , Coreg , Zetia .  LDL 71.  Repatha  as outpatient.  Statin intolerance.  Cardiac catheterization showed patent stents.  Cardiology believes NSTEMI secondary to small vessel disease.  Plavix  added and recommended for 1 year.  Refer to cardiac rehab.  Nitroglycerin  as needed.

## 2024-03-30 NOTE — Progress Notes (Signed)
 Date and time results received: 03/30/2024 0000    Test: troponin  Critical Value: 140  Name of Provider Notified: Dr Delayne Solian  Orders Received? Or Actions Taken?: paged

## 2024-03-30 NOTE — Progress Notes (Signed)
 PHARMACY - ANTICOAGULATION CONSULT NOTE  Pharmacy Consult for Heparin  infusion Indication: chest pain/ACS  Allergies[1]  Patient Measurements: Height: 5' 5 (165.1 cm) Weight: 46 kg (101 lb 6.6 oz) IBW/kg (Calculated) : 57 HEPARIN  DW (KG): 44.5  Vital Signs: Temp: 99.3 F (37.4 C) (12/20 0347) Temp Source: Oral (12/20 0347) BP: 129/68 (12/20 0347) Pulse Rate: 71 (12/20 0347)  Labs: Recent Labs    03/29/24 0645 03/29/24 2002 03/30/24 0429  HGB 13.6  --  13.0  HCT 41.8  --  39.0  PLT 251  --  250  APTT  --  187*  --   LABPROT  --  15.8*  --   INR  --  1.2  --   HEPARINUNFRC  --   --  <0.10*  CREATININE 0.79  --  0.76    Estimated Creatinine Clearance: 43.4 mL/min (by C-G formula based on SCr of 0.76 mg/dL).   Medical History: Past Medical History:  Diagnosis Date   AAA (abdominal aortic aneurysm)    CAD (coronary artery disease)    Emphysema of lung (HCC)    HLD (hyperlipidemia)    Hypertension    Migraine     Medications:  Not on anticoagulation at home per med rec and chart review Heparin  SQ for DVT ppx given 12/19 @ 1513  Assessment: Patient is a 78 year old female with a past medical history of HTN, HLD, CAD c/b NSTEMI s/p stent placement in 07/2022, thoracic and abdominal aortic aneurysm who presented to ED with chest pain. Troponin level 40>54>66. Pharmacy has been consulted to initiate patient on a heparin  infusion for ACS/STEMI.   Baseline INR and aPTT ordered. Hgb 13.6. PLT 251. No signs/symptoms of bleeding noted in chart.  Goal of Therapy:  Heparin  level 0.3-0.7 units/ml Monitor platelets by anticoagulation protocol: Yes   Plan:  12/20:  HL @ 0429 = < 0.1, SUBtherapeutic - Will order heparin  1350 units IV X 1 bolus and increased her drip rate to 650 units/hr  - Recheck HL 8 hrs after rate change  - Monitor CBC daily while on heparin   Tarek Cravens D, PharmD 03/30/2024,6:00 AM       [1]  Allergies Allergen Reactions   Wellbutrin  [Bupropion]     hives

## 2024-03-30 NOTE — Assessment & Plan Note (Deleted)
 Thoracic aneurysm 4.2 cm and AAA 3.9 cm.

## 2024-03-30 NOTE — Care Management Obs Status (Signed)
 MEDICARE OBSERVATION STATUS NOTIFICATION   Patient Details  Name: Tiffany Munoz MRN: [id number] Date of Birth: 1945-06-24   Medicare Observation Status Notification Given:  Yes    [name] 03/30/2024, 1:53 PM

## 2024-03-30 NOTE — Care Management Obs Status (Signed)
 MEDICARE OBSERVATION STATUS NOTIFICATION   Patient Details  Name: Tiffany Munoz MRN: [id number] Date of Birth: October 24, 1945   Medicare Observation Status Notification Given:   patient sleeping    [name] 03/30/2024, 12:57 PM

## 2024-03-30 NOTE — Plan of Care (Signed)

## 2024-03-30 NOTE — Assessment & Plan Note (Signed)
 On Coreg  and Norvasc 

## 2024-03-30 NOTE — Assessment & Plan Note (Signed)
 On Zetia .  On Repatha  as outpatient.  Statin intolerance.

## 2024-03-30 NOTE — Assessment & Plan Note (Signed)
 Needs to stop smoking.

## 2024-03-30 NOTE — Assessment & Plan Note (Signed)
 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

## 2024-03-30 NOTE — Progress Notes (Signed)
 "  Rounding Note   Patient Name: Tiffany Munoz Date of Encounter: 03/30/2024  [facility] Cardiologist: Redell Cave, MD   Subjective Presented to the hospital with chest pain concerning for angina Known history of coronary disease, prior non-STEMI April 2024, severe left circumflex disease with occluded posterior AV groove branch treated medically, 2 stents placed to left circumflex with moderate proximal LAD disease not treated at the time, moderate RCA disease  - Seen by interventional cardiology yesterday, scheduled for cardiac catheterization December 22 - Currently chest pain-free - Troponin 167, relatively nontrending   Scheduled Meds:  amLODipine   2.5 mg Oral BID   aspirin  EC  162 mg Oral Daily   carvedilol   6.25 mg Oral BID WC   cholecalciferol   1,000 Units Oral Daily   ezetimibe   10 mg Oral Daily   sodium chloride  flush  3 mL Intravenous Q12H   Continuous Infusions:  heparin  650 Units/hr (03/30/24 0619)   PRN Meds: acetaminophen  **OR** acetaminophen , nitroGLYCERIN , ondansetron  **OR** ondansetron  (ZOFRAN ) IV, senna-docusate   Vital Signs  Vitals:   03/30/24 0347 03/30/24 0500 03/30/24 0736 03/30/24 1242  BP: 129/68  126/67 122/60  Pulse: 71  71 63  Resp: 20  17   Temp: 99.3 F (37.4 C)  98.4 F (36.9 C) 98.2 F (36.8 C)  TempSrc: Oral     SpO2: 97%  96% 96%  Weight:  46 kg    Height:        Intake/Output Summary (Last 24 hours) at 03/30/2024 1402 Last data filed at 03/29/2024 2202 Gross per 24 hour  Intake 240 ml  Output --  Net 240 ml      03/30/2024    5:00 AM 03/29/2024    4:56 PM 03/29/2024    6:41 AM  Last 3 Weights  Weight (lbs) 101 lb 6.6 oz 102 lb 12.8 oz 98 lb  Weight (kg) 46 kg 46.63 kg 44.453 kg      Telemetry Normal sinus rhythm- Personally Reviewed  ECG   - Personally Reviewed  Physical Exam  GEN: No acute distress.   Neck: No JVD Cardiac: RRR, no murmurs, rubs, or gallops.  Respiratory: Clear to  auscultation bilaterally. GI: Soft, nontender, non-distended  MS: No edema; No deformity. Neuro:  Nonfocal  Psych: Normal affect   Labs High Sensitivity Troponin:  No results for input(s): TROPONINIHS in the last 720 hours.  Recent Labs  Lab 03/29/24 2249 03/30/24 0038 03/30/24 0240 03/30/24 0429 03/30/24 0623  TRNPT 140* 135* 149* 167* 167*       Chemistry Recent Labs  Lab 03/29/24 0645 03/29/24 1522 03/30/24 0429  NA 138  --  139  K 3.7  --  3.7  CL 104  --  105  CO2 26  --  26  GLUCOSE 96  --  82  BUN 26*  --  23  CREATININE 0.79  --  0.76  CALCIUM  9.5  --  9.1  MG  --  2.0  --   GFRNONAA >60  --  >60  ANIONGAP 9  --  9    Lipids  Recent Labs  Lab 03/30/24 0754  CHOL 141  TRIG 59  HDL 58  LDLCALC 71  CHOLHDL 2.4    Hematology Recent Labs  Lab 03/29/24 0645 03/30/24 0429  WBC 9.3 8.7  RBC 4.54 4.34  HGB 13.6 13.0  HCT 41.8 39.0  MCV 92.1 89.9  MCH 30.0 30.0  MCHC 32.5 33.3  RDW 14.1 14.0  PLT  251 250   Thyroid No results for input(s): TSH, FREET4 in the last 168 hours.  BNPNo results for input(s): BNP, PROBNP in the last 168 hours.  DDimer No results for input(s): DDIMER in the last 168 hours.   Radiology  ECHOCARDIOGRAM COMPLETE Result Date: 03/29/2024    ECHOCARDIOGRAM REPORT   Patient Name:   Tiffany Munoz Date of Exam: 03/29/2024 Medical Rec #:  [id number]     Height:       65.0 in Accession #:    [id number]    Weight:       98.0 lb Date of Birth:  Aug 21, 1945     BSA:          1.461 m Patient Age:    78 years      BP:           114/64 mmHg Patient Gender: F             HR:           65 bpm. Exam Location:  [facility] Procedure: 2D Echo, Cardiac Doppler and Color Doppler (Both Spectral and Color            Flow Doppler were utilized during procedure). Indications:     Chest Pain  History:         Patient has prior history of Echocardiogram examinations, most                  recent 07/09/2022. Previous Myocardial Infarction; Risk                   Factors:Hypertension and Dyslipidemia.  Sonographer:     Meagan Baucom RDCS, FE, PE Referring Phys:  Denyse Bathe Diagnosing Phys: [name] IMPRESSIONS  1. Left ventricular ejection fraction, by estimation, is 60 to 65%. The left ventricle has normal function. The left ventricle has no regional wall motion abnormalities. Left ventricular diastolic parameters are consistent with Grade I diastolic dysfunction (impaired relaxation).  2. Right ventricular systolic function is normal. The right ventricular size is normal. There is normal pulmonary artery systolic pressure. The estimated right ventricular systolic pressure is 19.9 mmHg.  3. The mitral valve is normal in structure. No evidence of mitral valve regurgitation. No evidence of mitral stenosis.  4. The aortic valve is normal in structure. Aortic valve regurgitation is not visualized. No aortic stenosis is present.  5. The inferior vena cava is normal in size with greater than 50% respiratory variability, suggesting right atrial pressure of 3 mmHg. FINDINGS  Left Ventricle: Left ventricular ejection fraction, by estimation, is 60 to 65%. The left ventricle has normal function. The left ventricle has no regional wall motion abnormalities. Strain was performed and the global longitudinal strain is indeterminate. The left ventricular internal cavity size was normal in size. There is no left ventricular hypertrophy. Left ventricular diastolic parameters are consistent with Grade I diastolic dysfunction (impaired relaxation). Right Ventricle: The right ventricular size is normal. No increase in right ventricular wall thickness. Right ventricular systolic function is normal. There is normal pulmonary artery systolic pressure. The tricuspid regurgitant velocity is 1.93 m/s, and  with an assumed right atrial pressure of 5 mmHg, the estimated right ventricular systolic pressure is 19.9 mmHg. Left Atrium: Left atrial size was normal in size. Right Atrium:  Right atrial size was normal in size. Pericardium: There is no evidence of pericardial effusion. Mitral Valve: The mitral valve is normal in structure. No evidence of mitral valve regurgitation. No evidence  of mitral valve stenosis. Tricuspid Valve: The tricuspid valve is normal in structure. Tricuspid valve regurgitation is mild . No evidence of tricuspid stenosis. Aortic Valve: The aortic valve is normal in structure. Aortic valve regurgitation is not visualized. No aortic stenosis is present. Pulmonic Valve: The pulmonic valve was normal in structure. Pulmonic valve regurgitation is not visualized. No evidence of pulmonic stenosis. Aorta: The aortic root is normal in size and structure. Venous: The inferior vena cava is normal in size with greater than 50% respiratory variability, suggesting right atrial pressure of 3 mmHg. IAS/Shunts: No atrial level shunt detected by color flow Doppler. Additional Comments: 3D was performed not requiring image post processing on an independent workstation and was indeterminate.  LEFT VENTRICLE PLAX 2D LVIDd:         3.60 cm   Diastology LVIDs:         2.50 cm   LV e' medial:    5.77 cm/s LV PW:         1.00 cm   LV E/e' medial:  11.3 LV IVS:        1.20 cm   LV e' lateral:   6.74 cm/s LVOT diam:     2.00 cm   LV E/e' lateral: 9.6 LV SV:         68 LV SV Index:   46 LVOT Area:     3.14 cm  RIGHT VENTRICLE RV S prime:     14.10 cm/s TAPSE (M-mode): 2.4 cm LEFT ATRIUM             Index LA Vol (A2C):   32.2 ml 22.08 ml/m LA Vol (A4C):   47.3 ml 32.38 ml/m LA Biplane Vol: 38.8 ml 26.56 ml/m  AORTIC VALVE LVOT Vmax:   94.90 cm/s LVOT Vmean:  61.300 cm/s LVOT VTI:    0.215 m  AORTA Ao Root diam: 3.00 cm MITRAL VALVE               TRICUSPID VALVE MV Area (PHT): 2.91 cm    TR Peak grad:   14.9 mmHg MV Decel Time: 261 msec    TR Vmax:        193.00 cm/s MV E velocity: 65.00 cm/s MV A velocity: 96.40 cm/s  SHUNTS MV E/A ratio:  0.67        Systemic VTI:  0.22 m                             Systemic Diam: 2.00 cm [name] Electronically signed by [name] Signature Date/Time: 03/29/2024/5:53:31 PM    Final    DG Chest 2 View Result Date: 03/29/2024 EXAM: 2 VIEW(S) XRAY OF THE CHEST 03/29/2024 07:05:49 AM COMPARISON: CT of the chest 07/08/2022. CLINICAL HISTORY: Chest pain. FINDINGS: LUNGS AND PLEURA: Hyperinflation. No focal pulmonary opacity. No pleural effusion. No pneumothorax. HEART AND MEDIASTINUM: Tortuous and ectatic aorta. Double density along left mediastinum, corresponding to known descending thoracic aortic aneurysm. BONES AND SOFT TISSUES: No acute osseous abnormality. IMPRESSION: 1. No acute findings. 2. Tortuous and ectatic aorta with known descending thoracic aortic aneurysm. Electronically signed by: Waddell Calk MD 03/29/2024 07:09 AM EST RP Workstation: [id number]    Cardiac Studies   Patient Profile   78 y.o. female   Assessment & Plan  Non-STEMI Known multivessel disease by prior cardiac catheterization 2024 -Presenting with chest pain concerning for unstable angina - Evaluated by interventional cardiology yesterday, plan for  left heart catheterization and possible PCI on December 22 - Orders signed and held On heparin  infusion Currently pain-free Continue aspirin , beta-blocker, Zetia , outpatient Repatha   Smoker We have encouraged her to continue to work on weaning her cigarettes and smoking cessation. She will continue to work on this and does not want any assistance with chantix.    Hyperlipidemia On Zetia  Statin intolerance Continue Repatha  with Zetia   PAD Aortic atherosclerosis, smoking cessation recommended  For questions or updates, please contact [facility] Please consult [email or web address] for contact info under   Signed, Alencia Gordon, MD  03/30/2024, 2:02 PM    "

## 2024-03-30 NOTE — Progress Notes (Signed)
" °  Progress Note   Patient: Tiffany Munoz FMW:[id number] DOB: [birth year] DOA: 03/29/2024     0 DOS: the patient was seen and examined on 03/30/2024   Brief hospital course: 78 y.o. year old female with medical history of HTN, HLD, CAD c/b NSTEMI s/p stent placement in 07/2022, thoracic and abdominal aortic aneurysm presenting to the ED with chest pain.    Patient reports her chest pain started early this morning around 430am. States she has been having trouble sleeping lately and was awake at the time this occurred.  She describes it as a pressure-like sensation. Rates it 3/4 out of 10.  She denies any associated symptoms of lightheadedness or dizziness or shortness of breath.  She denies any URI symptoms.   On arrival to the ED patient was noted to be HDS stable.  Lab work and imaging obtained.  CBC unremarkable, BMP overall unremarkable.  Troponin initially within normal limits but repeat troponin elevated at 40.  Further troponins are pending.  Chest x-ray without any acute findings.  Given patient cardiac history, [facility] contacted for admission.  12/20.  Patient still having a little pressure but better than when she came in.  On heparin  drip.  Cardiac catheterization for Monday.  Assessment and Plan: * Non-ST elevation (NSTEMI) myocardial infarction (HCC) Troponin up to 176.  Patient on aspirin , Coreg , Zetia .  LDL 71.  Cardiology planning on cardiac catheterization on Monday.  Essential hypertension On Coreg  and Norvasc   Mixed hyperlipidemia On Zetia   Thoracic aortic aneurysm Feasible aneurysm of the aortic arch measuring 4.6 x 4.1 cm and proximal descending thoracic aorta measuring 3.6 x 3.5 cm on last CT scan  Tobacco abuse Needs to stop smoking        Subjective: Patient feeling better than when she came in.  Came in with chest pain and now has rising troponin admitted with NSTEMI.  Physical Exam: Vitals:   03/30/24 0347 03/30/24 0500 03/30/24 0736 03/30/24 1242  BP: 129/68   126/67 122/60  Pulse: 71  71 63  Resp: 20  17   Temp: 99.3 F (37.4 C)  98.4 F (36.9 C) 98.2 F (36.8 C)  TempSrc: Oral     SpO2: 97%  96% 96%  Weight:  46 kg    Height:       Physical Exam HENT:     Head: Normocephalic.  Eyes:     General: Lids are normal.     Conjunctiva/sclera: Conjunctivae normal.  Cardiovascular:     Rate and Rhythm: Normal rate and regular rhythm.     Heart sounds: Normal heart sounds, S1 normal and S2 normal.  Pulmonary:     Breath sounds: No decreased breath sounds, wheezing, rhonchi or rales.  Abdominal:     Palpations: Abdomen is soft.     Tenderness: There is no abdominal tenderness.  Musculoskeletal:     Right lower leg: No swelling.     Left lower leg: No swelling.  Skin:    General: Skin is warm.     Findings: No rash.  Neurological:     Mental Status: She is alert and oriented to person, place, and time.     Data Reviewed: Troponin 167, LDL 71, CBC normal range, creatinine 0.76  Disposition: Status is: Observation Cardiac catheterization planned for Monday  Planned Discharge Destination: Home    Time spent: 28 minutes  Author: Charlie Patterson, MD 03/30/2024 1:03 PM  For on call review [email or web address].  "

## 2024-03-30 NOTE — Hospital Course (Signed)
 78 y.o. year old female with medical history of HTN, HLD, CAD c/b NSTEMI s/p stent placement in 07/2022, thoracic and abdominal aortic aneurysm presenting to the ED with chest pain.    Patient reports her chest pain started early this morning around 430am. States she has been having trouble sleeping lately and was awake at the time this occurred.  She describes it as a pressure-like sensation. Rates it 3/4 out of 10.  She denies any associated symptoms of lightheadedness or dizziness or shortness of breath.  She denies any URI symptoms.   On arrival to the ED patient was noted to be HDS stable.  Lab work and imaging obtained.  CBC unremarkable, BMP overall unremarkable.  Troponin initially within normal limits but repeat troponin elevated at 40.  Further troponins are pending.  Chest x-ray without any acute findings.  Given patient cardiac history, [facility] contacted for admission.  12/20.  Patient still having a little pressure but better than when she came in.  On heparin  drip.  Cardiac catheterization for Monday.

## 2024-03-31 DIAGNOSIS — I10 Essential (primary) hypertension: Secondary | ICD-10-CM | POA: Diagnosis not present

## 2024-03-31 DIAGNOSIS — I712 Thoracic aortic aneurysm, without rupture, unspecified: Secondary | ICD-10-CM | POA: Diagnosis not present

## 2024-03-31 DIAGNOSIS — E782 Mixed hyperlipidemia: Secondary | ICD-10-CM | POA: Diagnosis not present

## 2024-03-31 DIAGNOSIS — I214 Non-ST elevation (NSTEMI) myocardial infarction: Secondary | ICD-10-CM | POA: Diagnosis not present

## 2024-03-31 DIAGNOSIS — I25119 Atherosclerotic heart disease of native coronary artery with unspecified angina pectoris: Secondary | ICD-10-CM | POA: Diagnosis not present

## 2024-03-31 DIAGNOSIS — R7989 Other specified abnormal findings of blood chemistry: Secondary | ICD-10-CM | POA: Diagnosis not present

## 2024-03-31 DIAGNOSIS — Z720 Tobacco use: Secondary | ICD-10-CM | POA: Diagnosis not present

## 2024-03-31 DIAGNOSIS — I739 Peripheral vascular disease, unspecified: Secondary | ICD-10-CM | POA: Diagnosis not present

## 2024-03-31 DIAGNOSIS — I7121 Aneurysm of the ascending aorta, without rupture: Secondary | ICD-10-CM | POA: Diagnosis not present

## 2024-03-31 LAB — CBC
HCT: 39.6 % (ref 36.0–46.0)
Hemoglobin: 13 g/dL (ref 12.0–15.0)
MCH: 30.2 pg (ref 26.0–34.0)
MCHC: 32.8 g/dL (ref 30.0–36.0)
MCV: 92.1 fL (ref 80.0–100.0)
Platelets: 259 K/uL (ref 150–400)
RBC: 4.3 MIL/uL (ref 3.87–5.11)
RDW: 13.9 % (ref 11.5–15.5)
WBC: 8 K/uL (ref 4.0–10.5)
nRBC: 0 % (ref 0.0–0.2)

## 2024-03-31 LAB — HEPARIN LEVEL (UNFRACTIONATED)
Heparin Unfractionated: 0.2 [IU]/mL — ABNORMAL LOW (ref 0.30–0.70)
Heparin Unfractionated: 0.3 [IU]/mL (ref 0.30–0.70)
Heparin Unfractionated: 0.34 [IU]/mL (ref 0.30–0.70)

## 2024-03-31 LAB — GLUCOSE, CAPILLARY: Glucose-Capillary: 72 mg/dL (ref 70–99)

## 2024-03-31 MED ORDER — FREE WATER
500.0000 mL | Freq: Once | Status: AC
  Administered 2024-04-01: 500 mL via ORAL

## 2024-03-31 MED ORDER — ASPIRIN 81 MG PO CHEW
81.0000 mg | CHEWABLE_TABLET | ORAL | Status: AC
  Administered 2024-04-01: 81 mg via ORAL
  Filled 2024-03-31: qty 1

## 2024-03-31 MED ORDER — HEPARIN BOLUS VIA INFUSION
650.0000 [IU] | Freq: Once | INTRAVENOUS | Status: AC
  Administered 2024-03-31: 650 [IU] via INTRAVENOUS
  Filled 2024-03-31: qty 650

## 2024-03-31 NOTE — Progress Notes (Signed)
 PHARMACY - ANTICOAGULATION CONSULT NOTE  Pharmacy Consult for Heparin  infusion Indication: chest pain/ACS  Allergies[1]  Patient Measurements: Height: 5' 5 (165.1 cm) Weight: 46 kg (101 lb 6.6 oz) IBW/kg (Calculated) : 57 HEPARIN  DW (KG): 44.5  Vital Signs: Temp: 98.4 F (36.9 C) (12/20 2339) Temp Source: Oral (12/20 2018) BP: 149/77 (12/20 2339) Pulse Rate: 63 (12/20 2339)  Labs: Recent Labs    03/29/24 0645 03/29/24 2002 03/30/24 0429 03/30/24 1504 03/31/24 0054  HGB 13.6  --  13.0  --   --   HCT 41.8  --  39.0  --   --   PLT 251  --  250  --   --   APTT  --  187*  --   --   --   LABPROT  --  15.8*  --   --   --   INR  --  1.2  --   --   --   HEPARINUNFRC  --   --  <0.10* 0.18* 0.20*  CREATININE 0.79  --  0.76  --   --     Estimated Creatinine Clearance: 43.4 mL/min (by C-G formula based on SCr of 0.76 mg/dL).   Medical History: Past Medical History:  Diagnosis Date   AAA (abdominal aortic aneurysm)    CAD (coronary artery disease)    Emphysema of lung (HCC)    HLD (hyperlipidemia)    Hypertension    Migraine     Medications:  Not on anticoagulation at home per med rec and chart review Heparin  SQ for DVT ppx given 12/19 @ 1513  Assessment: Patient is a 78 year old female with a past medical history of HTN, HLD, CAD c/b NSTEMI s/p stent placement in 07/2022, thoracic and abdominal aortic aneurysm who presented to ED with chest pain. Troponin level 40>54>66. Pharmacy has been consulted to initiate patient on a heparin  infusion for ACS/STEMI.   Baseline INR and aPTT ordered. Hgb 13.6. PLT 251. No signs/symptoms of bleeding noted in chart.  12/20:  HL @ 0429 = < 0.1, SUBtherapeutic 12/20: HL @ 1504 = 0.18, SUBtherapeutic 12/21: HL @ 0054 = 0.20, SUBtherapeutic   Goal of Therapy:  Heparin  level 0.3-0.7 units/ml Monitor platelets by anticoagulation protocol: Yes   Plan:  12/21: HL @ 0054 = 0.20, SUBtherapeutic  - Will order heparin  650 units IV X  1 and increase drip rate to 850 units/hr  - Recheck HL 8 hrs after rate change  - Monitor CBC daily while on heparin   Armeda Plumb D, PharmD 03/31/2024,1:47 AM         [1]  Allergies Allergen Reactions   Wellbutrin [Bupropion]     hives

## 2024-03-31 NOTE — Progress Notes (Signed)
 PHARMACY - ANTICOAGULATION CONSULT NOTE  Pharmacy Consult for Heparin  infusion Indication: chest pain/ACS  Allergies[1]  Patient Measurements: Height: 5' 5 (165.1 cm) Weight: 45.2 kg (99 lb 11.2 oz) IBW/kg (Calculated) : 57 HEPARIN  DW (KG): 44.5  Vital Signs: Temp: 98.3 F (36.8 C) (12/21 0748) Temp Source: Oral (12/21 0748) BP: 119/64 (12/21 0748) Pulse Rate: 65 (12/21 0748)  Labs: Recent Labs    03/29/24 0645 03/29/24 0645 03/29/24 2002 03/30/24 0429 03/30/24 1504 03/31/24 0054 03/31/24 0428 03/31/24 0857  HGB 13.6  --   --  13.0  --   --  13.0  --   HCT 41.8  --   --  39.0  --   --  39.6  --   PLT 251  --   --  250  --   --  259  --   APTT  --   --  187*  --   --   --   --   --   LABPROT  --   --  15.8*  --   --   --   --   --   INR  --   --  1.2  --   --   --   --   --   HEPARINUNFRC  --    < >  --  <0.10* 0.18* 0.20*  --  0.30  CREATININE 0.79  --   --  0.76  --   --   --   --    < > = values in this interval not displayed.    Estimated Creatinine Clearance: 42.7 mL/min (by C-G formula based on SCr of 0.76 mg/dL).   Medical History: Past Medical History:  Diagnosis Date   AAA (abdominal aortic aneurysm)    CAD (coronary artery disease)    Emphysema of lung (HCC)    HLD (hyperlipidemia)    Hypertension    Migraine     Medications:  Not on anticoagulation at home per med rec and chart review Heparin  SQ for DVT ppx given 12/19 @ 1513  Assessment: Patient is a 78 year old female with a past medical history of HTN, HLD, CAD c/b NSTEMI s/p stent placement in 07/2022, thoracic and abdominal aortic aneurysm who presented to ED with chest pain. Troponin level 40>54>66. Pharmacy has been consulted to initiate patient on a heparin  infusion for ACS/STEMI.   Baseline INR and aPTT ordered. Hgb 13.6. PLT 251. No signs/symptoms of bleeding noted in chart.  12/20:  HL @ 0429 = < 0.1, SUBtherapeutic 12/20: HL @ 1504 = 0.18, SUBtherapeutic 12/21: HL @ 0054 =  0.20, SUBtherapeutic  12/21 0857 HL 0.30, therapeutic x1 barely   Goal of Therapy:  Heparin  level 0.3-0.7 units/ml Monitor platelets by anticoagulation protocol: Yes   Plan:  12/21 0857 HL 0.30, therapeutic barely - Will slightly increase heparin  drip to 900 units/hr - Recheck HL 8 hrs after rate change  - Monitor CBC daily while on heparin   Allean Haas PharmD Clinical Pharmacist 03/31/2024          [1]  Allergies Allergen Reactions   Wellbutrin [Bupropion]     hives

## 2024-03-31 NOTE — Plan of Care (Signed)

## 2024-03-31 NOTE — Plan of Care (Signed)
  Problem: Clinical Measurements: Goal: Ability to maintain clinical measurements within normal limits will improve Outcome: Progressing   Problem: Clinical Measurements: Goal: Cardiovascular complication will be avoided Outcome: Progressing   Problem: Activity: Goal: Risk for activity intolerance will decrease Outcome: Progressing   Problem: Pain Managment: Goal: General experience of comfort will improve and/or be controlled Outcome: Progressing   Problem: Safety: Goal: Ability to remain free from injury will improve Outcome: Progressing

## 2024-03-31 NOTE — Progress Notes (Signed)
 "  Rounding Note   Patient Name: Tiffany Munoz Date of Encounter: 03/31/2024  [facility] Cardiologist: Redell Cave, MD   Subjective Resting comfortably, on heparin  infusion Denies chest pain concerning for angina Scheduled for cardiac catheterization tomorrow morning with Dr. Darron 7:30 AM  Known history of coronary disease, prior non-STEMI April 2024, severe left circumflex disease with occluded posterior AV groove branch treated medically, 2 stents placed to left circumflex with moderate proximal LAD disease not treated at the time, moderate RCA disease  - Troponin 167, nontrending   Scheduled Meds:  amLODipine   2.5 mg Oral BID   aspirin  EC  162 mg Oral Daily   carvedilol   6.25 mg Oral BID WC   cholecalciferol   1,000 Units Oral Daily   ezetimibe   10 mg Oral Daily   sodium chloride  flush  3 mL Intravenous Q12H   Continuous Infusions:  heparin  900 Units/hr (03/31/24 1034)   PRN Meds: acetaminophen  **OR** acetaminophen , nitroGLYCERIN , ondansetron  **OR** ondansetron  (ZOFRAN ) IV, senna-docusate   Vital Signs  Vitals:   03/31/24 0335 03/31/24 0449 03/31/24 0748 03/31/24 1119  BP: 138/78  119/64 118/68  Pulse: 60  65 62  Resp: 17  17 18   Temp: 98.6 F (37 C)  98.3 F (36.8 C) 98.3 F (36.8 C)  TempSrc:   Oral   SpO2: 98%  95% 97%  Weight:  45.2 kg    Height:        Intake/Output Summary (Last 24 hours) at 03/31/2024 1152 Last data filed at 03/31/2024 0900 Gross per 24 hour  Intake 542.62 ml  Output --  Net 542.62 ml      03/31/2024    4:49 AM 03/30/2024    5:00 AM 03/29/2024    4:56 PM  Last 3 Weights  Weight (lbs) 99 lb 11.2 oz 101 lb 6.6 oz 102 lb 12.8 oz  Weight (kg) 45.224 kg 46 kg 46.63 kg      Telemetry Sinus rhythm- Personally Reviewed  ECG   - Personally Reviewed  Physical Exam  On examination : alert oriented, no JVD, lungs clear to auscultation bilaterally, heart sounds regular normal S1-S2 no murmurs appreciated, abdomen  soft nontender no significant lower extremity edema.  Musculoskeletal exam with good range of motion, neurologic exam grossly nonfocal   Labs High Sensitivity Troponin:  No results for input(s): TROPONINIHS in the last 720 hours.  Recent Labs  Lab 03/29/24 2249 03/30/24 0038 03/30/24 0240 03/30/24 0429 03/30/24 0623  TRNPT 140* 135* 149* 167* 167*       Chemistry Recent Labs  Lab 03/29/24 0645 03/29/24 1522 03/30/24 0429  NA 138  --  139  K 3.7  --  3.7  CL 104  --  105  CO2 26  --  26  GLUCOSE 96  --  82  BUN 26*  --  23  CREATININE 0.79  --  0.76  CALCIUM  9.5  --  9.1  MG  --  2.0  --   GFRNONAA >60  --  >60  ANIONGAP 9  --  9    Lipids  Recent Labs  Lab 03/30/24 0754  CHOL 141  TRIG 59  HDL 58  LDLCALC 71  CHOLHDL 2.4    Hematology Recent Labs  Lab 03/29/24 0645 03/30/24 0429 03/31/24 0428  WBC 9.3 8.7 8.0  RBC 4.54 4.34 4.30  HGB 13.6 13.0 13.0  HCT 41.8 39.0 39.6  MCV 92.1 89.9 92.1  MCH 30.0 30.0 30.2  MCHC 32.5 33.3  32.8  RDW 14.1 14.0 13.9  PLT 251 250 259   Thyroid No results for input(s): TSH, FREET4 in the last 168 hours.  BNPNo results for input(s): BNP, PROBNP in the last 168 hours.  DDimer No results for input(s): DDIMER in the last 168 hours.   Radiology  ECHOCARDIOGRAM COMPLETE Result Date: 03/29/2024    ECHOCARDIOGRAM REPORT   Patient Name:   Tiffany Munoz Date of Exam: 03/29/2024 Medical Rec #:  [id number]     Height:       65.0 in Accession #:    [id number]    Weight:       98.0 lb Date of Birth:  [birth year]     BSA:          1.461 m Patient Age:    78 years      BP:           114/64 mmHg Patient Gender: F             HR:           65 bpm. Exam Location:  [facility] Procedure: 2D Echo, Cardiac Doppler and Color Doppler (Both Spectral and Color            Flow Doppler were utilized during procedure). Indications:     Chest Pain  History:         Patient has prior history of Echocardiogram examinations, most                   recent 07/09/2022. Previous Myocardial Infarction; Risk                  Factors:Hypertension and Dyslipidemia.  Sonographer:     Meagan Baucom RDCS, FE, PE Referring Phys:  Denyse Bathe Diagnosing Phys: [name] IMPRESSIONS  1. Left ventricular ejection fraction, by estimation, is 60 to 65%. The left ventricle has normal function. The left ventricle has no regional wall motion abnormalities. Left ventricular diastolic parameters are consistent with Grade I diastolic dysfunction (impaired relaxation).  2. Right ventricular systolic function is normal. The right ventricular size is normal. There is normal pulmonary artery systolic pressure. The estimated right ventricular systolic pressure is 19.9 mmHg.  3. The mitral valve is normal in structure. No evidence of mitral valve regurgitation. No evidence of mitral stenosis.  4. The aortic valve is normal in structure. Aortic valve regurgitation is not visualized. No aortic stenosis is present.  5. The inferior vena cava is normal in size with greater than 50% respiratory variability, suggesting right atrial pressure of 3 mmHg. FINDINGS  Left Ventricle: Left ventricular ejection fraction, by estimation, is 60 to 65%. The left ventricle has normal function. The left ventricle has no regional wall motion abnormalities. Strain was performed and the global longitudinal strain is indeterminate. The left ventricular internal cavity size was normal in size. There is no left ventricular hypertrophy. Left ventricular diastolic parameters are consistent with Grade I diastolic dysfunction (impaired relaxation). Right Ventricle: The right ventricular size is normal. No increase in right ventricular wall thickness. Right ventricular systolic function is normal. There is normal pulmonary artery systolic pressure. The tricuspid regurgitant velocity is 1.93 m/s, and  with an assumed right atrial pressure of 5 mmHg, the estimated right ventricular systolic pressure is 19.9 mmHg.  Left Atrium: Left atrial size was normal in size. Right Atrium: Right atrial size was normal in size. Pericardium: There is no evidence of pericardial effusion. Mitral Valve: The mitral valve is normal in  structure. No evidence of mitral valve regurgitation. No evidence of mitral valve stenosis. Tricuspid Valve: The tricuspid valve is normal in structure. Tricuspid valve regurgitation is mild . No evidence of tricuspid stenosis. Aortic Valve: The aortic valve is normal in structure. Aortic valve regurgitation is not visualized. No aortic stenosis is present. Pulmonic Valve: The pulmonic valve was normal in structure. Pulmonic valve regurgitation is not visualized. No evidence of pulmonic stenosis. Aorta: The aortic root is normal in size and structure. Venous: The inferior vena cava is normal in size with greater than 50% respiratory variability, suggesting right atrial pressure of 3 mmHg. IAS/Shunts: No atrial level shunt detected by color flow Doppler. Additional Comments: 3D was performed not requiring image post processing on an independent workstation and was indeterminate.  LEFT VENTRICLE PLAX 2D LVIDd:         3.60 cm   Diastology LVIDs:         2.50 cm   LV e' medial:    5.77 cm/s LV PW:         1.00 cm   LV E/e' medial:  11.3 LV IVS:        1.20 cm   LV e' lateral:   6.74 cm/s LVOT diam:     2.00 cm   LV E/e' lateral: 9.6 LV SV:         68 LV SV Index:   46 LVOT Area:     3.14 cm  RIGHT VENTRICLE RV S prime:     14.10 cm/s TAPSE (M-mode): 2.4 cm LEFT ATRIUM             Index LA Vol (A2C):   32.2 ml 22.08 ml/m LA Vol (A4C):   47.3 ml 32.38 ml/m LA Biplane Vol: 38.8 ml 26.56 ml/m  AORTIC VALVE LVOT Vmax:   94.90 cm/s LVOT Vmean:  61.300 cm/s LVOT VTI:    0.215 m  AORTA Ao Root diam: 3.00 cm MITRAL VALVE               TRICUSPID VALVE MV Area (PHT): 2.91 cm    TR Peak grad:   14.9 mmHg MV Decel Time: 261 msec    TR Vmax:        193.00 cm/s MV E velocity: 65.00 cm/s MV A velocity: 96.40 cm/s  SHUNTS MV E/A  ratio:  0.67        Systemic VTI:  0.22 m                            Systemic Diam: 2.00 cm [name] Electronically signed by [name] Signature Date/Time: 03/29/2024/5:53:31 PM    Final     Cardiac Studies   Patient Profile   Rhylynn Perdomo is a 78 y.o. female with a hx of coronary artery disease who is being seen 03/29/2024 for the evaluation of non-STEMI   Assessment & Plan  Non-STEMI Known multivessel disease by prior cardiac catheterization 2024 -Presenting with chest pain concerning for unstable angina -Seen by interventional cardiology on admission, they have scheduled cardiac catheterization December 22 at 7:30 AM left heart catheterization and possible PCI on December 22 -Orders placed, n.p.o. after midnight On heparin  infusion Currently pain-free Continue aspirin , beta-blocker, Zetia , outpatient Repatha   Smoker Cessation recommended  Hyperlipidemia On Zetia  Statin intolerance Continue Repatha  with Zetia   PAD Aortic atherosclerosis, smoking cessation recommended  For questions or updates, please contact [facility] Please consult [email or web address] for contact  info under   Signed, Kenwood Rosiak, MD  03/31/2024, 11:52 AM   Maybe I can send it out "

## 2024-03-31 NOTE — Progress Notes (Signed)
" °  Progress Note   Patient: Tiffany Munoz FMW:[id number] DOB: 1945/08/13 DOA: 03/29/2024     1 DOS: the patient was seen and examined on 03/31/2024   Brief hospital course: 78 y.o. year old female with medical history of HTN, HLD, CAD c/b NSTEMI s/p stent placement in 07/2022, thoracic and abdominal aortic aneurysm presenting to the ED with chest pain.    Patient reports her chest pain started early this morning around 430am. States she has been having trouble sleeping lately and was awake at the time this occurred.  She describes it as a pressure-like sensation. Rates it 3/4 out of 10.  She denies any associated symptoms of lightheadedness or dizziness or shortness of breath.  She denies any URI symptoms.   On arrival to the ED patient was noted to be HDS stable.  Lab work and imaging obtained.  CBC unremarkable, BMP overall unremarkable.  Troponin initially within normal limits but repeat troponin elevated at 40.  Further troponins are pending.  Chest x-ray without any acute findings.  Given patient cardiac history, [facility] contacted for admission.  12/20.  Patient still having a little pressure but better than when she came in.  On heparin  drip.  Cardiac catheterization for Monday 12/21.  Patient not having any chest pain today.  Assessment and Plan: * Non-ST elevation (NSTEMI) myocardial infarction (HCC) Troponin up to 176.  Patient on aspirin , Coreg , Zetia .  LDL 71.  Cardiac catheterization on Monday.  Essential hypertension On Coreg  and Norvasc   Mixed hyperlipidemia On Zetia .  On Repatha  as outpatient.  Statin intolerance.  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  Not having any chest pain today.  Physical Exam: Vitals:   03/31/24 0335 03/31/24 0449 03/31/24 0748 03/31/24 1119   BP: 138/78  119/64 118/68  Pulse: 60  65 62  Resp: 17  17 18   Temp: 98.6 F (37 C)  98.3 F (36.8 C) 98.3 F (36.8 C)  TempSrc:   Oral   SpO2: 98%  95% 97%  Weight:  45.2 kg    Height:       Physical Exam HENT:     Head: Normocephalic.  Eyes:     General: Lids are normal.     Conjunctiva/sclera: Conjunctivae normal.  Cardiovascular:     Rate and Rhythm: Normal rate and regular rhythm.     Heart sounds: Normal heart sounds, S1 normal and S2 normal.  Pulmonary:     Breath sounds: No decreased breath sounds, wheezing, rhonchi or rales.  Abdominal:     Palpations: Abdomen is soft.     Tenderness: There is no abdominal tenderness.  Musculoskeletal:     Right lower leg: No swelling.     Left lower leg: No swelling.  Skin:    General: Skin is warm.     Findings: No rash.  Neurological:     Mental Status: She is alert and oriented to person, place, and time.     Data Reviewed: No new data  Family Communication: Declined  Disposition: Status is: Inpatient Remains inpatient appropriate because: Cardiac catheterization on Monday  Planned Discharge Destination: Home    Time spent: 28 minutes  Author: Charlie Patterson, MD 03/31/2024 1:55 PM  For on call review [email or web address].  "

## 2024-03-31 NOTE — H&P (View-Only) (Signed)
 "  Rounding Note   Patient Name: Tiffany Munoz Date of Encounter: 03/31/2024  [facility] Cardiologist: Redell Cave, MD   Subjective Resting comfortably, on heparin  infusion Denies chest pain concerning for angina Scheduled for cardiac catheterization tomorrow morning with Dr. Darron 7:30 AM  Known history of coronary disease, prior non-STEMI April 2024, severe left circumflex disease with occluded posterior AV groove branch treated medically, 2 stents placed to left circumflex with moderate proximal LAD disease not treated at the time, moderate RCA disease  - Troponin 167, nontrending   Scheduled Meds:  amLODipine   2.5 mg Oral BID   aspirin  EC  162 mg Oral Daily   carvedilol   6.25 mg Oral BID WC   cholecalciferol   1,000 Units Oral Daily   ezetimibe   10 mg Oral Daily   sodium chloride  flush  3 mL Intravenous Q12H   Continuous Infusions:  heparin  900 Units/hr (03/31/24 1034)   PRN Meds: acetaminophen  **OR** acetaminophen , nitroGLYCERIN , ondansetron  **OR** ondansetron  (ZOFRAN ) IV, senna-docusate   Vital Signs  Vitals:   03/31/24 0335 03/31/24 0449 03/31/24 0748 03/31/24 1119  BP: 138/78  119/64 118/68  Pulse: 60  65 62  Resp: 17  17 18   Temp: 98.6 F (37 C)  98.3 F (36.8 C) 98.3 F (36.8 C)  TempSrc:   Oral   SpO2: 98%  95% 97%  Weight:  45.2 kg    Height:        Intake/Output Summary (Last 24 hours) at 03/31/2024 1152 Last data filed at 03/31/2024 0900 Gross per 24 hour  Intake 542.62 ml  Output --  Net 542.62 ml      03/31/2024    4:49 AM 03/30/2024    5:00 AM 03/29/2024    4:56 PM  Last 3 Weights  Weight (lbs) 99 lb 11.2 oz 101 lb 6.6 oz 102 lb 12.8 oz  Weight (kg) 45.224 kg 46 kg 46.63 kg      Telemetry Sinus rhythm- Personally Reviewed  ECG   - Personally Reviewed  Physical Exam  On examination : alert oriented, no JVD, lungs clear to auscultation bilaterally, heart sounds regular normal S1-S2 no murmurs appreciated, abdomen  soft nontender no significant lower extremity edema.  Musculoskeletal exam with good range of motion, neurologic exam grossly nonfocal   Labs High Sensitivity Troponin:  No results for input(s): TROPONINIHS in the last 720 hours.  Recent Labs  Lab 03/29/24 2249 03/30/24 0038 03/30/24 0240 03/30/24 0429 03/30/24 0623  TRNPT 140* 135* 149* 167* 167*       Chemistry Recent Labs  Lab 03/29/24 0645 03/29/24 1522 03/30/24 0429  NA 138  --  139  K 3.7  --  3.7  CL 104  --  105  CO2 26  --  26  GLUCOSE 96  --  82  BUN 26*  --  23  CREATININE 0.79  --  0.76  CALCIUM  9.5  --  9.1  MG  --  2.0  --   GFRNONAA >60  --  >60  ANIONGAP 9  --  9    Lipids  Recent Labs  Lab 03/30/24 0754  CHOL 141  TRIG 59  HDL 58  LDLCALC 71  CHOLHDL 2.4    Hematology Recent Labs  Lab 03/29/24 0645 03/30/24 0429 03/31/24 0428  WBC 9.3 8.7 8.0  RBC 4.54 4.34 4.30  HGB 13.6 13.0 13.0  HCT 41.8 39.0 39.6  MCV 92.1 89.9 92.1  MCH 30.0 30.0 30.2  MCHC 32.5 33.3  32.8  RDW 14.1 14.0 13.9  PLT 251 250 259   Thyroid No results for input(s): TSH, FREET4 in the last 168 hours.  BNPNo results for input(s): BNP, PROBNP in the last 168 hours.  DDimer No results for input(s): DDIMER in the last 168 hours.   Radiology  ECHOCARDIOGRAM COMPLETE Result Date: 03/29/2024    ECHOCARDIOGRAM REPORT   Patient Name:   Tiffany Munoz Date of Exam: 03/29/2024 Medical Rec #:  [id number]     Height:       65.0 in Accession #:    [id number]    Weight:       98.0 lb Date of Birth:  [birth year]     BSA:          1.461 m Patient Age:    78 years      BP:           114/64 mmHg Patient Gender: F             HR:           65 bpm. Exam Location:  [facility] Procedure: 2D Echo, Cardiac Doppler and Color Doppler (Both Spectral and Color            Flow Doppler were utilized during procedure). Indications:     Chest Pain  History:         Patient has prior history of Echocardiogram examinations, most                   recent 07/09/2022. Previous Myocardial Infarction; Risk                  Factors:Hypertension and Dyslipidemia.  Sonographer:     Meagan Baucom RDCS, FE, PE Referring Phys:  Denyse Bathe Diagnosing Phys: [name] IMPRESSIONS  1. Left ventricular ejection fraction, by estimation, is 60 to 65%. The left ventricle has normal function. The left ventricle has no regional wall motion abnormalities. Left ventricular diastolic parameters are consistent with Grade I diastolic dysfunction (impaired relaxation).  2. Right ventricular systolic function is normal. The right ventricular size is normal. There is normal pulmonary artery systolic pressure. The estimated right ventricular systolic pressure is 19.9 mmHg.  3. The mitral valve is normal in structure. No evidence of mitral valve regurgitation. No evidence of mitral stenosis.  4. The aortic valve is normal in structure. Aortic valve regurgitation is not visualized. No aortic stenosis is present.  5. The inferior vena cava is normal in size with greater than 50% respiratory variability, suggesting right atrial pressure of 3 mmHg. FINDINGS  Left Ventricle: Left ventricular ejection fraction, by estimation, is 60 to 65%. The left ventricle has normal function. The left ventricle has no regional wall motion abnormalities. Strain was performed and the global longitudinal strain is indeterminate. The left ventricular internal cavity size was normal in size. There is no left ventricular hypertrophy. Left ventricular diastolic parameters are consistent with Grade I diastolic dysfunction (impaired relaxation). Right Ventricle: The right ventricular size is normal. No increase in right ventricular wall thickness. Right ventricular systolic function is normal. There is normal pulmonary artery systolic pressure. The tricuspid regurgitant velocity is 1.93 m/s, and  with an assumed right atrial pressure of 5 mmHg, the estimated right ventricular systolic pressure is 19.9 mmHg.  Left Atrium: Left atrial size was normal in size. Right Atrium: Right atrial size was normal in size. Pericardium: There is no evidence of pericardial effusion. Mitral Valve: The mitral valve is normal in  structure. No evidence of mitral valve regurgitation. No evidence of mitral valve stenosis. Tricuspid Valve: The tricuspid valve is normal in structure. Tricuspid valve regurgitation is mild . No evidence of tricuspid stenosis. Aortic Valve: The aortic valve is normal in structure. Aortic valve regurgitation is not visualized. No aortic stenosis is present. Pulmonic Valve: The pulmonic valve was normal in structure. Pulmonic valve regurgitation is not visualized. No evidence of pulmonic stenosis. Aorta: The aortic root is normal in size and structure. Venous: The inferior vena cava is normal in size with greater than 50% respiratory variability, suggesting right atrial pressure of 3 mmHg. IAS/Shunts: No atrial level shunt detected by color flow Doppler. Additional Comments: 3D was performed not requiring image post processing on an independent workstation and was indeterminate.  LEFT VENTRICLE PLAX 2D LVIDd:         3.60 cm   Diastology LVIDs:         2.50 cm   LV e' medial:    5.77 cm/s LV PW:         1.00 cm   LV E/e' medial:  11.3 LV IVS:        1.20 cm   LV e' lateral:   6.74 cm/s LVOT diam:     2.00 cm   LV E/e' lateral: 9.6 LV SV:         68 LV SV Index:   46 LVOT Area:     3.14 cm  RIGHT VENTRICLE RV S prime:     14.10 cm/s TAPSE (M-mode): 2.4 cm LEFT ATRIUM             Index LA Vol (A2C):   32.2 ml 22.08 ml/m LA Vol (A4C):   47.3 ml 32.38 ml/m LA Biplane Vol: 38.8 ml 26.56 ml/m  AORTIC VALVE LVOT Vmax:   94.90 cm/s LVOT Vmean:  61.300 cm/s LVOT VTI:    0.215 m  AORTA Ao Root diam: 3.00 cm MITRAL VALVE               TRICUSPID VALVE MV Area (PHT): 2.91 cm    TR Peak grad:   14.9 mmHg MV Decel Time: 261 msec    TR Vmax:        193.00 cm/s MV E velocity: 65.00 cm/s MV A velocity: 96.40 cm/s  SHUNTS MV E/A  ratio:  0.67        Systemic VTI:  0.22 m                            Systemic Diam: 2.00 cm [name] Electronically signed by [name] Signature Date/Time: 03/29/2024/5:53:31 PM    Final     Cardiac Studies   Patient Profile   Rhylynn Perdomo is a 78 y.o. female with a hx of coronary artery disease who is being seen 03/29/2024 for the evaluation of non-STEMI   Assessment & Plan  Non-STEMI Known multivessel disease by prior cardiac catheterization 2024 -Presenting with chest pain concerning for unstable angina -Seen by interventional cardiology on admission, they have scheduled cardiac catheterization December 22 at 7:30 AM left heart catheterization and possible PCI on December 22 -Orders placed, n.p.o. after midnight On heparin  infusion Currently pain-free Continue aspirin , beta-blocker, Zetia , outpatient Repatha   Smoker Cessation recommended  Hyperlipidemia On Zetia  Statin intolerance Continue Repatha  with Zetia   PAD Aortic atherosclerosis, smoking cessation recommended  For questions or updates, please contact [facility] Please consult [email or web address] for contact  info under   Signed, Kenwood Rosiak, MD  03/31/2024, 11:52 AM   Maybe I can send it out "

## 2024-03-31 NOTE — Progress Notes (Signed)
 PHARMACY - ANTICOAGULATION CONSULT NOTE  Pharmacy Consult for Heparin  infusion Indication: chest pain/ACS  Allergies[1]  Patient Measurements: Height: 5' 5 (165.1 cm) Weight: 45.2 kg (99 lb 11.2 oz) IBW/kg (Calculated) : 57 HEPARIN  DW (KG): 44.5  Vital Signs: Temp: 97.8 F (36.6 C) (12/21 1415) Temp Source: Oral (12/21 0748) BP: 121/46 (12/21 1415) Pulse Rate: 57 (12/21 1415)  Labs: Recent Labs    03/29/24 0645 03/29/24 2002 03/30/24 0429 03/30/24 1504 03/31/24 0054 03/31/24 0428 03/31/24 0857 03/31/24 1740  HGB 13.6  --  13.0  --   --  13.0  --   --   HCT 41.8  --  39.0  --   --  39.6  --   --   PLT 251  --  250  --   --  259  --   --   APTT  --  187*  --   --   --   --   --   --   LABPROT  --  15.8*  --   --   --   --   --   --   INR  --  1.2  --   --   --   --   --   --   HEPARINUNFRC  --   --  <0.10*   < > 0.20*  --  0.30 0.34  CREATININE 0.79  --  0.76  --   --   --   --   --    < > = values in this interval not displayed.    Estimated Creatinine Clearance: 42.7 mL/min (by C-G formula based on SCr of 0.76 mg/dL).   Medical History: Past Medical History:  Diagnosis Date   AAA (abdominal aortic aneurysm)    CAD (coronary artery disease)    Emphysema of lung (HCC)    HLD (hyperlipidemia)    Hypertension    Migraine     Medications:  Not on anticoagulation at home per med rec and chart review Heparin  SQ for DVT ppx given 12/19 @ 1513  Assessment: Patient is a 78 year old female with a past medical history of HTN, HLD, CAD c/b NSTEMI s/p stent placement in 07/2022, thoracic and abdominal aortic aneurysm who presented to ED with chest pain. Troponin level 40>54>66. Pharmacy has been consulted to initiate patient on a heparin  infusion for ACS/STEMI.   Baseline INR and aPTT ordered. Hgb 13.6. PLT 251. No signs/symptoms of bleeding noted in chart.  12/20:  HL @ 0429 = < 0.1, SUBtherapeutic 12/20: HL @ 1504 = 0.18, SUBtherapeutic 12/21: HL @ 0054 =  0.20, SUBtherapeutic  12/21 0857 HL 0.30, therapeutic x1 12/21 1740 HL 0.34, therapeutic x 2   Goal of Therapy:  Heparin  level 0.3-0.7 units/ml Monitor platelets by anticoagulation protocol: Yes   Plan:  HL therapeutic x 2 Continue heparin  infusion at 900 units/hr Recheck HL daily with AM labs while therapeutic CBC daily while on heparin   Kayla Mose Niels DOUGLAS, PharmD, BCPS Clinical Pharmacist 03/31/2024           [1]  Allergies Allergen Reactions   Wellbutrin [Bupropion]     hives

## 2024-04-01 ENCOUNTER — Encounter: Admission: EM | Disposition: A | Payer: Self-pay | Source: Home / Self Care | Attending: Internal Medicine

## 2024-04-01 ENCOUNTER — Encounter: Payer: Self-pay | Admitting: Cardiovascular Disease

## 2024-04-01 DIAGNOSIS — I10 Essential (primary) hypertension: Secondary | ICD-10-CM | POA: Diagnosis not present

## 2024-04-01 DIAGNOSIS — I2510 Atherosclerotic heart disease of native coronary artery without angina pectoris: Secondary | ICD-10-CM | POA: Diagnosis not present

## 2024-04-01 DIAGNOSIS — Z955 Presence of coronary angioplasty implant and graft: Secondary | ICD-10-CM

## 2024-04-01 DIAGNOSIS — I712 Thoracic aortic aneurysm, without rupture, unspecified: Secondary | ICD-10-CM | POA: Diagnosis not present

## 2024-04-01 DIAGNOSIS — E782 Mixed hyperlipidemia: Secondary | ICD-10-CM | POA: Diagnosis not present

## 2024-04-01 DIAGNOSIS — Z720 Tobacco use: Secondary | ICD-10-CM | POA: Diagnosis not present

## 2024-04-01 DIAGNOSIS — I214 Non-ST elevation (NSTEMI) myocardial infarction: Secondary | ICD-10-CM | POA: Diagnosis not present

## 2024-04-01 HISTORY — PX: LEFT HEART CATH AND CORONARY ANGIOGRAPHY: CATH118249

## 2024-04-01 LAB — CBC
HCT: 41.3 % (ref 36.0–46.0)
Hemoglobin: 13.3 g/dL (ref 12.0–15.0)
MCH: 29.9 pg (ref 26.0–34.0)
MCHC: 32.2 g/dL (ref 30.0–36.0)
MCV: 92.8 fL (ref 80.0–100.0)
Platelets: 244 K/uL (ref 150–400)
RBC: 4.45 MIL/uL (ref 3.87–5.11)
RDW: 14.1 % (ref 11.5–15.5)
WBC: 7.4 K/uL (ref 4.0–10.5)
nRBC: 0 % (ref 0.0–0.2)

## 2024-04-01 LAB — HEPARIN LEVEL (UNFRACTIONATED): Heparin Unfractionated: 0.3 [IU]/mL (ref 0.30–0.70)

## 2024-04-01 MED ORDER — EZETIMIBE 10 MG PO TABS: 10.0000 mg | ORAL_TABLET | Freq: Every day | ORAL | 0 refills | Status: AC

## 2024-04-01 MED ORDER — HEPARIN (PORCINE) IN NACL 1000-0.9 UT/500ML-% IV SOLN
INTRAVENOUS | Status: DC | PRN
  Administered 2024-04-01: 1000 mL

## 2024-04-01 MED ORDER — HEPARIN SODIUM (PORCINE) 1000 UNIT/ML IJ SOLN
INTRAMUSCULAR | Status: AC
  Filled 2024-04-01: qty 10

## 2024-04-01 MED ORDER — SODIUM CHLORIDE 0.9% FLUSH: 3.0000 mL | INTRAVENOUS | Status: DC | PRN

## 2024-04-01 MED ORDER — HEPARIN (PORCINE) IN NACL 1000-0.9 UT/500ML-% IV SOLN
INTRAVENOUS | Status: AC
  Filled 2024-04-01: qty 1000

## 2024-04-01 MED ORDER — MIDAZOLAM HCL 2 MG/2ML IJ SOLN
INTRAMUSCULAR | Status: AC
  Filled 2024-04-01: qty 2

## 2024-04-01 MED ORDER — VERAPAMIL HCL 2.5 MG/ML IV SOLN
INTRAVENOUS | Status: AC
  Filled 2024-04-01: qty 2

## 2024-04-01 MED ORDER — CLOPIDOGREL BISULFATE 75 MG PO TABS: 75.0000 mg | ORAL_TABLET | Freq: Every day | ORAL | Status: DC

## 2024-04-01 MED ORDER — LIDOCAINE HCL (PF) 1 % IJ SOLN
INTRAMUSCULAR | Status: DC | PRN
  Administered 2024-04-01: 2 mL

## 2024-04-01 MED ORDER — CLOPIDOGREL BISULFATE 75 MG PO TABS
ORAL_TABLET | ORAL | Status: DC | PRN
  Administered 2024-04-01: 300 mg via ORAL

## 2024-04-01 MED ORDER — HEPARIN SODIUM (PORCINE) 1000 UNIT/ML IJ SOLN
INTRAMUSCULAR | Status: DC | PRN
  Administered 2024-04-01: 2500 [IU] via INTRAVENOUS

## 2024-04-01 MED ORDER — SODIUM CHLORIDE 0.9 % IV SOLN: 250.0000 mL | INTRAVENOUS | Status: DC | PRN

## 2024-04-01 MED ORDER — IOHEXOL 300 MG/ML  SOLN
INTRAMUSCULAR | Status: DC | PRN
  Administered 2024-04-01: 46 mL

## 2024-04-01 MED ORDER — CLOPIDOGREL BISULFATE 75 MG PO TABS
ORAL_TABLET | ORAL | Status: AC
  Filled 2024-04-01: qty 4

## 2024-04-01 MED ORDER — VERAPAMIL HCL 2.5 MG/ML IV SOLN
INTRAVENOUS | Status: DC | PRN
  Administered 2024-04-01: 2.5 mg via INTRA_ARTERIAL

## 2024-04-01 MED ORDER — FENTANYL CITRATE (PF) 100 MCG/2ML IJ SOLN
INTRAMUSCULAR | Status: AC
  Filled 2024-04-01: qty 2

## 2024-04-01 MED ORDER — SODIUM CHLORIDE 0.9% FLUSH
3.0000 mL | Freq: Two times a day (BID) | INTRAVENOUS | Status: DC
  Administered 2024-04-01: 20 mL via INTRAVENOUS

## 2024-04-01 MED ORDER — LIDOCAINE HCL 1 % IJ SOLN
INTRAMUSCULAR | Status: AC
  Filled 2024-04-01: qty 20

## 2024-04-01 MED ORDER — ASPIRIN 81 MG PO TBEC: 81.0000 mg | DELAYED_RELEASE_TABLET | Freq: Every day | ORAL | Status: DC

## 2024-04-01 MED ORDER — MIDAZOLAM HCL (PF) 2 MG/2ML IJ SOLN
INTRAMUSCULAR | Status: DC | PRN
  Administered 2024-04-01: 1 mg via INTRAVENOUS

## 2024-04-01 MED ORDER — FENTANYL CITRATE (PF) 100 MCG/2ML IJ SOLN
INTRAMUSCULAR | Status: DC | PRN
  Administered 2024-04-01: 25 ug via INTRAVENOUS

## 2024-04-01 MED ORDER — FREE WATER
500.0000 mL | Freq: Once | Status: AC
  Administered 2024-04-01: 500 mL via ORAL

## 2024-04-01 MED ORDER — NITROGLYCERIN 0.4 MG SL SUBL: 0.4000 mg | SUBLINGUAL_TABLET | SUBLINGUAL | 0 refills | Status: AC | PRN

## 2024-04-01 MED ORDER — CLOPIDOGREL BISULFATE 75 MG PO TABS: 75.0000 mg | ORAL_TABLET | Freq: Every day | ORAL | 0 refills | Status: AC

## 2024-04-01 NOTE — Progress Notes (Signed)
 PHARMACY - ANTICOAGULATION CONSULT NOTE  Pharmacy Consult for Heparin  infusion Indication: chest pain/ACS  Allergies[1]  Patient Measurements: Height: 5' 5 (165.1 cm) Weight: 49.1 kg (108 lb 3.9 oz) IBW/kg (Calculated) : 57 HEPARIN  DW (KG): 44.5  Vital Signs: Temp: 98.1 F (36.7 C) (12/22 0444) BP: 122/51 (12/22 0444) Pulse Rate: 56 (12/22 0444)  Labs: Recent Labs    03/29/24 0645 03/29/24 2002 03/30/24 0429 03/30/24 1504 03/31/24 0428 03/31/24 0857 03/31/24 1740 04/01/24 0436  HGB 13.6  --  13.0  --  13.0  --   --  13.3  HCT 41.8  --  39.0  --  39.6  --   --  41.3  PLT 251  --  250  --  259  --   --  244  APTT  --  187*  --   --   --   --   --   --   LABPROT  --  15.8*  --   --   --   --   --   --   INR  --  1.2  --   --   --   --   --   --   HEPARINUNFRC  --   --  <0.10*   < >  --  0.30 0.34 0.30  CREATININE 0.79  --  0.76  --   --   --   --   --    < > = values in this interval not displayed.    Estimated Creatinine Clearance: 46.4 mL/min (by C-G formula based on SCr of 0.76 mg/dL).   Medical History: Past Medical History:  Diagnosis Date   AAA (abdominal aortic aneurysm)    CAD (coronary artery disease)    Emphysema of lung (HCC)    HLD (hyperlipidemia)    Hypertension    Migraine     Medications:  Not on anticoagulation at home per med rec and chart review Heparin  SQ for DVT ppx given 12/19 @ 1513  Assessment: Patient is a 78 year old female with a past medical history of HTN, HLD, CAD c/b NSTEMI s/p stent placement in 07/2022, thoracic and abdominal aortic aneurysm who presented to ED with chest pain. Troponin level 40>54>66. Pharmacy has been consulted to initiate patient on a heparin  infusion for ACS/STEMI.   Baseline INR and aPTT ordered. Hgb 13.6. PLT 251. No signs/symptoms of bleeding noted in chart.  12/20:  HL @ 0429 = < 0.1, SUBtherapeutic 12/20: HL @ 1504 = 0.18, SUBtherapeutic 12/21: HL @ 0054 = 0.20, SUBtherapeutic  12/21 0857 HL  0.30, therapeutic x1 12/21 1740 HL 0.34, therapeutic x 2 12/22 0436 HL 0.30, therapeutic x 3    Goal of Therapy:  Heparin  level 0.3-0.7 units/ml Monitor platelets by anticoagulation protocol: Yes   Plan:  HL therapeutic x 3 Continue heparin  infusion at 900 units/hr Recheck HL daily with AM labs while therapeutic CBC daily while on heparin   Tremel Setters D Clinical Pharmacist 04/01/2024            [1]  Allergies Allergen Reactions   Wellbutrin [Bupropion]     hives

## 2024-04-01 NOTE — Discharge Summary (Signed)
 " Physician Discharge Summary   Patient: Tiffany Munoz MRN: [id number] DOB: 23-Aug-1945  Admit date:     03/29/2024  Discharge date: 04/01/2024  Discharge Physician: Charlie Patterson   PCP: Glover Lenis, MD   Recommendations at discharge:   Follow-up PCP 5 days Follow-up cardiology 1 week Refer to cardiac rehab  Discharge Diagnoses: Principal Problem:   Non-ST elevation (NSTEMI) myocardial infarction [facility]) Active Problems:   Essential hypertension   Mixed hyperlipidemia   Thoracic aortic aneurysm   Tobacco abuse   PAD (peripheral artery disease)   Chest pain   Elevated troponin   Chest pain due to CAD   Hospital Course: 78 y.o. year old female with medical history of HTN, HLD, CAD c/b NSTEMI s/p stent placement in 07/2022, thoracic and abdominal aortic aneurysm presenting to the ED with chest pain.    Patient reports her chest pain started early this morning around 430am. States she has been having trouble sleeping lately and was awake at the time this occurred.  She describes it as a pressure-like sensation. Rates it 3/4 out of 10.  She denies any associated symptoms of lightheadedness or dizziness or shortness of breath.  She denies any URI symptoms.   On arrival to the ED patient was noted to be HDS stable.  Lab work and imaging obtained.  CBC unremarkable, BMP overall unremarkable.  Troponin initially within normal limits but repeat troponin elevated at 40.  Further troponins are pending.  Chest x-ray without any acute findings.  Given patient cardiac history, [facility] contacted for admission.  12/20.  Patient still having a little pressure but better than when she came in.  On heparin  drip.  Cardiac catheterization for Monday 12/21.  Patient not having any chest pain today. 12/22 cardiac cath showing patent left circumflex stents with no significant restenosis, moderate LAD and RCA disease progression of small vessel disease involving second diagonal, OM1 and right PDA (all  small vessels).  Cardiology believes myocardial infarction is due to small vessel disease.  Plavix  added and recommended for 1 year.  As needed nitroglycerin .  Stable for discharge.  Assessment and Plan: * Non-ST elevation (NSTEMI) myocardial infarction (HCC) Troponin up to 176.  Patient on aspirin , Coreg , Zetia .  LDL 71.  Repatha  as outpatient.  Statin intolerance.  Cardiac catheterization showed patent stents.  Cardiology believes NSTEMI secondary to small vessel disease.  Plavix  added and recommended for 1 year.  Refer to cardiac rehab.  Nitroglycerin  as needed.  Essential hypertension On Coreg  and Norvasc   Mixed hyperlipidemia On Zetia .  On Repatha  as outpatient.  Statin intolerance.  Thoracic aortic aneurysm Feasible aneurysm of the aortic arch measuring 4.6 x 4.1 cm and proximal descending thoracic aorta measuring 3.6 x 3.5 cm on last CT scan  Tobacco abuse Needs to stop smoking         Consultants: Cardiology Procedures performed: Cardiac catheterization Disposition: Home Diet recommendation:  Cardiac diet DISCHARGE MEDICATION: Allergies as of 04/01/2024       Reactions   Wellbutrin [bupropion]    hives        Medication List     TAKE these medications    amLODipine  5 MG tablet Commonly known as: NORVASC  Take 0.5 tablets (2.5 mg total) by mouth in the morning and at bedtime.   aspirin  81 MG tablet Take 2 tablets by mouth daily.   carvedilol  6.25 MG tablet Commonly known as: COREG  Take by mouth.   cholecalciferol  25 MCG (1000 UNIT) tablet Commonly known as: VITAMIN D3  Take 500 Units by mouth daily. What changed: how much to take   clobetasol cream 0.05 % Commonly known as: TEMOVATE Apply 1 Application topically 3 (three) times daily as needed.   clopidogrel  75 MG tablet Commonly known as: PLAVIX  Take 1 tablet (75 mg total) by mouth daily with breakfast. Start taking on: April 02, 2024   estradiol  0.01 % Crea vaginal cream Commonly known  as: ESTRACE  Apply one pea-sized amount around the opening of the urethra daily for 2 weeks, then 3 times weekly moving forward.   ezetimibe  10 MG tablet Commonly known as: ZETIA  Take 1 tablet (10 mg total) by mouth daily.   multivitamin tablet Take 1 tablet by mouth daily.   nitroGLYCERIN  0.4 MG SL tablet Commonly known as: NITROSTAT  Place 1 tablet (0.4 mg total) under the tongue every 5 (five) minutes x 3 doses as needed for chest pain.   potassium chloride  10 MEQ CR capsule Commonly known as: MICRO-K  Take 10 mEq by mouth daily.   Repatha  SureClick 140 MG/ML Soaj Generic drug: Evolocumab  ADMINISTER 1 ML UNDER THE SKIN EVERY 14 DAYS        Follow-up Information     Glover Lenis, MD Follow up in 5 day(s).   Specialty: Family Medicine Contact information: [address] [place] [address] [phone]         Darliss Rogue, MD Follow up in 1 week(s).   Specialties: Cardiology, Radiology Contact information: [address] [place] [address] [phone]                Discharge Exam: Tiffany Munoz   03/31/24 2006 04/01/24 0500 04/01/24 0722  Weight: 46.9 kg 49.1 kg 49.4 kg   Physical Exam HENT:     Head: Normocephalic.  Eyes:     General: Lids are normal.     Conjunctiva/sclera: Conjunctivae normal.  Cardiovascular:     Rate and Rhythm: Normal rate and regular rhythm.     Heart sounds: Normal heart sounds, S1 normal and S2 normal.  Pulmonary:     Breath sounds: No decreased breath sounds, wheezing, rhonchi or rales.  Abdominal:     Palpations: Abdomen is soft.     Tenderness: There is no abdominal tenderness.  Musculoskeletal:     Right lower leg: No swelling.     Left lower leg: No swelling.  Skin:    General: Skin is warm.     Findings: No rash.  Neurological:     Mental Status: She is alert and oriented to person, place, and time.      Condition at discharge: stable  The results of significant diagnostics from  this hospitalization (including imaging, microbiology, ancillary and laboratory) are listed below for reference.   Imaging Studies: CARDIAC CATHETERIZATION Result Date: 04/01/2024   Mid LAD lesion is 50% stenosed.   Dist RCA lesion is 60% stenosed.   LPAV lesion is 100% stenosed.   Mid Cx lesion is 40% stenosed.   2nd Mrg lesion is 80% stenosed.   Prox LAD lesion is 40% stenosed.   Dist LAD lesion is 80% stenosed.   2nd Diag lesion is 90% stenosed.   Prox RCA lesion is 40% stenosed.   Mid RCA lesion is 40% stenosed.   RPDA lesion is 80% stenosed.   Non-stenotic Dist Cx lesion was previously treated.   Non-stenotic Prox Cx to Mid Cx lesion was previously treated. 1.  Patent left circumflex stents with no significant restenosis.  Stable moderate LAD and RCA  disease.  Progression of small vessel disease involving second diagonal, OM 2 and right PDA.  All these vessels are less than 2 mm in diameter. 2.  Left ventricular angiography was not performed.  EF was normal by echo.  Normal left ventricular end-diastolic pressure. Recommendations: Recommend continuing medical therapy.  Suspect myocardial infarction is due to small vessel disease.  Recommend adding clopidogrel  again for at least another year.   ECHOCARDIOGRAM COMPLETE Result Date: 03/29/2024    ECHOCARDIOGRAM REPORT   Patient Name:   Tiffany Munoz Date of Exam: 03/29/2024 Medical Rec #:  [id number]     Height:       65.0 in Accession #:    [id number]    Weight:       98.0 lb Date of Birth:  05-14-1945     BSA:          1.461 m Patient Age:    78 years      BP:           114/64 mmHg Patient Gender: F             HR:           65 bpm. Exam Location:  [facility] Procedure: 2D Echo, Cardiac Doppler and Color Doppler (Both Spectral and Color            Flow Doppler were utilized during procedure). Indications:     Chest Pain  History:         Patient has prior history of Echocardiogram examinations, most                  recent 07/09/2022. Previous Myocardial  Infarction; Risk                  Factors:Hypertension and Dyslipidemia.  Sonographer:     Meagan Baucom RDCS, FE, PE Referring Phys:  Denyse Bathe Diagnosing Phys: [name] IMPRESSIONS  1. Left ventricular ejection fraction, by estimation, is 60 to 65%. The left ventricle has normal function. The left ventricle has no regional wall motion abnormalities. Left ventricular diastolic parameters are consistent with Grade I diastolic dysfunction (impaired relaxation).  2. Right ventricular systolic function is normal. The right ventricular size is normal. There is normal pulmonary artery systolic pressure. The estimated right ventricular systolic pressure is 19.9 mmHg.  3. The mitral valve is normal in structure. No evidence of mitral valve regurgitation. No evidence of mitral stenosis.  4. The aortic valve is normal in structure. Aortic valve regurgitation is not visualized. No aortic stenosis is present.  5. The inferior vena cava is normal in size with greater than 50% respiratory variability, suggesting right atrial pressure of 3 mmHg. FINDINGS  Left Ventricle: Left ventricular ejection fraction, by estimation, is 60 to 65%. The left ventricle has normal function. The left ventricle has no regional wall motion abnormalities. Strain was performed and the global longitudinal strain is indeterminate. The left ventricular internal cavity size was normal in size. There is no left ventricular hypertrophy. Left ventricular diastolic parameters are consistent with Grade I diastolic dysfunction (impaired relaxation). Right Ventricle: The right ventricular size is normal. No increase in right ventricular wall thickness. Right ventricular systolic function is normal. There is normal pulmonary artery systolic pressure. The tricuspid regurgitant velocity is 1.93 m/s, and  with an assumed right atrial pressure of 5 mmHg, the estimated right ventricular systolic pressure is 19.9 mmHg. Left Atrium: Left atrial size was  normal in size. Right Atrium: Right atrial size  was normal in size. Pericardium: There is no evidence of pericardial effusion. Mitral Valve: The mitral valve is normal in structure. No evidence of mitral valve regurgitation. No evidence of mitral valve stenosis. Tricuspid Valve: The tricuspid valve is normal in structure. Tricuspid valve regurgitation is mild . No evidence of tricuspid stenosis. Aortic Valve: The aortic valve is normal in structure. Aortic valve regurgitation is not visualized. No aortic stenosis is present. Pulmonic Valve: The pulmonic valve was normal in structure. Pulmonic valve regurgitation is not visualized. No evidence of pulmonic stenosis. Aorta: The aortic root is normal in size and structure. Venous: The inferior vena cava is normal in size with greater than 50% respiratory variability, suggesting right atrial pressure of 3 mmHg. IAS/Shunts: No atrial level shunt detected by color flow Doppler. Additional Comments: 3D was performed not requiring image post processing on an independent workstation and was indeterminate.  LEFT VENTRICLE PLAX 2D LVIDd:         3.60 cm   Diastology LVIDs:         2.50 cm   LV e' medial:    5.77 cm/s LV PW:         1.00 cm   LV E/e' medial:  11.3 LV IVS:        1.20 cm   LV e' lateral:   6.74 cm/s LVOT diam:     2.00 cm   LV E/e' lateral: 9.6 LV SV:         68 LV SV Index:   46 LVOT Area:     3.14 cm  RIGHT VENTRICLE RV S prime:     14.10 cm/s TAPSE (M-mode): 2.4 cm LEFT ATRIUM             Index LA Vol (A2C):   32.2 ml 22.08 ml/m LA Vol (A4C):   47.3 ml 32.38 ml/m LA Biplane Vol: 38.8 ml 26.56 ml/m  AORTIC VALVE LVOT Vmax:   94.90 cm/s LVOT Vmean:  61.300 cm/s LVOT VTI:    0.215 m  AORTA Ao Root diam: 3.00 cm MITRAL VALVE               TRICUSPID VALVE MV Area (PHT): 2.91 cm    TR Peak grad:   14.9 mmHg MV Decel Time: 261 msec    TR Vmax:        193.00 cm/s MV E velocity: 65.00 cm/s MV A velocity: 96.40 cm/s  SHUNTS MV E/A ratio:  0.67        Systemic VTI:   0.22 m                            Systemic Diam: 2.00 cm [name] Electronically signed by [name] Signature Date/Time: 03/29/2024/5:53:31 PM    Final    DG Chest 2 View Result Date: 03/29/2024 EXAM: 2 VIEW(S) XRAY OF THE CHEST 03/29/2024 07:05:49 AM COMPARISON: CT of the chest 07/08/2022. CLINICAL HISTORY: Chest pain. FINDINGS: LUNGS AND PLEURA: Hyperinflation. No focal pulmonary opacity. No pleural effusion. No pneumothorax. HEART AND MEDIASTINUM: Tortuous and ectatic aorta. Double density along left mediastinum, corresponding to known descending thoracic aortic aneurysm. BONES AND SOFT TISSUES: No acute osseous abnormality. IMPRESSION: 1. No acute findings. 2. Tortuous and ectatic aorta with known descending thoracic aortic aneurysm. Electronically signed by: Waddell Calk MD 03/29/2024 07:09 AM EST RP Workstation: [id number]      Labs: CBC: Recent Labs  Lab 03/29/24 0645 03/30/24 0429 03/31/24  [id number] 04/01/24 0436  WBC 9.3 8.7 8.0 7.4  HGB 13.6 13.0 13.0 13.3  HCT 41.8 39.0 39.6 41.3  MCV 92.1 89.9 92.1 92.8  PLT 251 250 259 244   Basic Metabolic Panel: Recent Labs  Lab 03/29/24 0645 03/29/24 1522 03/30/24 0429  NA 138  --  139  K 3.7  --  3.7  CL 104  --  105  CO2 26  --  26  GLUCOSE 96  --  82  BUN 26*  --  23  CREATININE 0.79  --  0.76  CALCIUM  9.5  --  9.1  MG  --  2.0  --    Liver Function Tests: No results for input(s): AST, ALT, ALKPHOS, BILITOT, PROT, ALBUMIN in the last 168 hours. CBG: Recent Labs  Lab 03/31/24 0812  GLUCAP 72    Discharge time spent: greater than 30 minutes.  Signed: Charlie Patterson, MD [facility] 04/01/2024 "

## 2024-04-01 NOTE — Interval H&P Note (Signed)
 History and Physical Interval Note:  04/01/2024 7:48 AM  Tiffany Munoz  has presented today for surgery, with the diagnosis of Non-ST elevation myocardial infarction.  The various methods of treatment have been discussed with the patient and family. After consideration of risks, benefits and other options for treatment, the patient has consented to  Procedures: LEFT HEART CATH AND CORONARY ANGIOGRAPHY (N/A) as a surgical intervention.  The patient's history has been reviewed, patient examined, no change in status, stable for surgery.  I have reviewed the patient's chart and labs.  Questions were answered to the patient's satisfaction.     [name]

## 2024-04-01 NOTE — Progress Notes (Signed)
 Pt with glasses, cell phone, charger, purse, atm, credit cards x2, pants, robe, insurance card, ID, and a bag with pt . Calling for ptto be taken out to discharge lounge

## 2024-04-08 ENCOUNTER — Ambulatory Visit: Admitting: Medical

## 2024-04-08 ENCOUNTER — Encounter: Payer: Self-pay | Admitting: Medical

## 2024-04-08 VITALS — BP 104/60 | HR 73 | Ht 65.0 in | Wt 102.1 lb

## 2024-04-08 DIAGNOSIS — I2510 Atherosclerotic heart disease of native coronary artery without angina pectoris: Secondary | ICD-10-CM | POA: Diagnosis not present

## 2024-04-08 DIAGNOSIS — I712 Thoracic aortic aneurysm, without rupture, unspecified: Secondary | ICD-10-CM | POA: Insufficient documentation

## 2024-04-08 DIAGNOSIS — Z79899 Other long term (current) drug therapy: Secondary | ICD-10-CM | POA: Insufficient documentation

## 2024-04-08 DIAGNOSIS — E785 Hyperlipidemia, unspecified: Secondary | ICD-10-CM | POA: Insufficient documentation

## 2024-04-08 DIAGNOSIS — I10 Essential (primary) hypertension: Secondary | ICD-10-CM | POA: Insufficient documentation

## 2024-04-08 DIAGNOSIS — E782 Mixed hyperlipidemia: Secondary | ICD-10-CM | POA: Diagnosis not present

## 2024-04-08 NOTE — Patient Instructions (Signed)
 Medication Instructions:  Your physician recommends that you continue on your current medications as directed. Please refer to the Current Medication list given to you today.    *If you need a refill on your cardiac medications before your next appointment, please call your pharmacy*  Lab Work: Your provider would like for you to have following labs drawn today CBC.     Testing/Procedures: No test ordered today   Follow-Up: At [facility], you and your health needs are our priority.  As part of our continuing mission to provide you with exceptional heart care, our providers are all part of one team.  This team includes your primary Cardiologist (physician) and Advanced Practice Providers or APPs (Physician Assistants and Nurse Practitioners) who all work together to provide you with the care you need, when you need it.  Your next appointment:   3 month(s)  Provider:   Redell Cave, MD or Cadence Franchester, PA-C

## 2024-04-08 NOTE — Progress Notes (Unsigned)
 " Cardiology Office Note   Date:  04/09/2024  ID:  [name], [name] [birth year], MRN [id number] PCP: Glover Lenis, MD  [facility] Providers Cardiologist:  Redell Cave, MD   History of Present Illness Tiffany Munoz is a 78 y.o. female with a h/o CAD, hypertension, hyperlipidemia, thoracic and abdominal aortic aneurysm, tobacco use who is being seen for hospital follow-up.  Patient was hospitalized in April 2024 with non-STEMI.  Cardiac cath showed severe left circumflex disease with occluded posterior AV groove branch that was the culprit but small and left to be treated medically.  2 stents were placed to the left circumflex.  There was moderate proximal LAD disease that was not significant by fractional flow reserve evaluation.  An additional, there was moderate RCA disease.  She did well post PCI and was treated with DAPT for 1 year.  The patient was admitted 03/29/2024 with chest pain and mildly elevated troponin.  Echo showed normal EF, normal LVEDP.  Cardiac cath showed patent left circumflex stents with no significant restenosis.  Stable moderate LAD and RCA disease.  Progression of small vessel disease involving second diagonal, OM 2 and right PDA.  All these vessels were less than 2 mm in diameter.  Recommended medical therapy, suspected MI due to small vessel disease. Plavix  was added for at least another year.   Today, the patient denies chest pain or SOB. The only symptoms were chest pressure. Cath site is healed well. Patient is agreeable to cardiac rehab. She denies bleeding issues with aSA and plavix .   Studies Reviewed EKG Interpretation Date/Time:  Monday April 08 2024 14:39:09 EST Ventricular Rate:  73 PR Interval:  144 QRS Duration:  86 QT Interval:  410 QTC Calculation: 451 R Axis:   -19  Text Interpretation: Normal sinus rhythm Normal ECG When compared with ECG of 29-Mar-2024 06:43, Criteria for Septal infarct are no longer  Present Confirmed by Franchester, Chelise Hanger ([id number]) on 04/08/2024 2:54:34 PM     LHC 03/2024    Mid LAD lesion is 50% stenosed.   Dist RCA lesion is 60% stenosed.   LPAV lesion is 100% stenosed.   Mid Cx lesion is 40% stenosed.   2nd Mrg lesion is 80% stenosed.   Prox LAD lesion is 40% stenosed.   Dist LAD lesion is 80% stenosed.   2nd Diag lesion is 90% stenosed.   Prox RCA lesion is 40% stenosed.   Mid RCA lesion is 40% stenosed.   RPDA lesion is 80% stenosed.   Non-stenotic Dist Cx lesion was previously treated.   Non-stenotic Prox Cx to Mid Cx lesion was previously treated.   1.  Patent left circumflex stents with no significant restenosis.  Stable moderate LAD and RCA disease.  Progression of small vessel disease involving second diagonal, OM 2 and right PDA.  All these vessels are less than 2 mm in diameter. 2.  Left ventricular angiography was not performed.  EF was normal by echo.  Normal left ventricular end-diastolic pressure.   Recommendations: Recommend continuing medical therapy.  Suspect myocardial infarction is due to small vessel disease.  Recommend adding clopidogrel  again for at least another year.  Echo 03/2024 1. Left ventricular ejection fraction, by estimation, is 60 to 65%. The  left ventricle has normal function. The left ventricle has no regional  wall motion abnormalities. Left ventricular diastolic parameters are  consistent with Grade I diastolic  dysfunction (impaired relaxation).   2. Right ventricular systolic function is normal. The right  ventricular  size is normal. There is normal pulmonary artery systolic pressure. The  estimated right ventricular systolic pressure is 19.9 mmHg.   3. The mitral valve is normal in structure. No evidence of mitral valve  regurgitation. No evidence of mitral stenosis.   4. The aortic valve is normal in structure. Aortic valve regurgitation is  not visualized. No aortic stenosis is present.   5. The inferior vena cava is  normal in size with greater than 50%  respiratory variability, suggesting right atrial pressure of 3 mmHg.   2D echo 07/09/2022: 1. Left ventricular ejection fraction, by estimation, is 55 to 60%. Left  ventricular ejection fraction by 2D MOD biplane is 66.1 %. The left  ventricle has normal function. The left ventricle has no regional wall  motion abnormalities. There is mild left  ventricular hypertrophy. Left ventricular diastolic parameters are  consistent with Grade I diastolic dysfunction (impaired relaxation).   2. Right ventricular systolic function is normal. The right ventricular  size is normal.   3. The mitral valve is normal in structure. No evidence of mitral valve  regurgitation.   4. The aortic valve is tricuspid. Aortic valve regurgitation is not  visualized. Aortic valve sclerosis is present, with no evidence of aortic  valve stenosis.   5. The inferior vena cava is normal in size with greater than 50%  respiratory variability, suggesting right atrial pressure of 3 mmHg.  __________   LHC 07/11/2022:   Prox LAD lesion is 30% stenosed.   Mid LAD lesion is 50% stenosed.   Mid Cx to Dist Cx lesion is 95% stenosed.   Prox Cx to Mid Cx lesion is 85% stenosed.   Prox RCA lesion is 30% stenosed.   Mid RCA lesion is 30% stenosed.   Dist RCA lesion is 60% stenosed.   RPDA lesion is 70% stenosed.   2nd Diag lesion is 80% stenosed.   LPAV lesion is 100% stenosed.   Mid Cx lesion is 30% stenosed.   A drug-eluting stent was successfully placed using a STENT ONYX FRONTIER 2.25X15.   A drug-eluting stent was successfully placed using a STENT ONYX FRONTIER 4.0X12.   Post intervention, there is a 0% residual stenosis.   Post intervention, there is a 0% residual stenosis.   The left ventricular systolic function is normal.   LV end diastolic pressure is normal.   The left ventricular ejection fraction is 55-65% by visual estimate.   1.  Non-ST elevation myocardial infarction.   The culprit seems to be the left circumflex with an occluded small posterior AV groove branch with collaterals, significant distal and proximal stenosis and an aneurysmal vessel especially in the midsegment.  The LAD has moderate disease that was not significant by fractional flow reserve evaluation.  The second diagonal has significant stenosis but small in size.  The right PDA also has significant mid stenosis but small in size. 2.  Normal LV systolic function and normal left ventricular end-diastolic pressure. 3.  Successful angioplasty and drug-eluting stent placement to the distal and proximal left circumflex.   Recommendations: Dual antiplatelet therapy for 12 months. Aggressive treatment of risk factors. Treat small vessel disease medically. __________   24-hour ambulatory BP monitor 05/2023: Mean BP 136/70 mmHg Average HR 63 BPM     Physical Exam VS:  BP 104/60 (BP Location: Left Arm, Patient Position: Sitting, Cuff Size: Normal)   Pulse 73   Ht 5' 5 (1.651 m)   Wt 102 lb 2 oz (46.3  kg)   SpO2 97%   BMI 16.99 kg/m        Wt Readings from Last 3 Encounters:  04/08/24 102 lb 2 oz (46.3 kg)  04/01/24 108 lb 14.5 oz (49.4 kg)  03/14/24 99 lb (44.9 kg)    GEN: Well nourished, well developed in no acute distress NECK: No JVD; No carotid bruits CARDIAC: RRR, no murmurs, rubs, gallops RESPIRATORY:  Clear to auscultation without rales, wheezing or rhonchi  ABDOMEN: Soft, non-tender, non-distended EXTREMITIES:  No edema; No deformity   ASSESSMENT AND PLAN  NSTEMI CAD Patient was recently admitted for NSTEMI with mildly elevated troponins. LHC showed patent left circumflex stents with no significant restenosis.  Stable moderate LAD and RCA disease.  Progression of small vessel disease involving second diagonal, OM 2 and right PDA.  All these vessels were less than 2 mm in diameter.  Recommended medical therapy, suspected small vessel disease. Plavix  was added to ASA for at least  a year. The patient has been doing well since being at home. She denies further chest pressure. No SOB. Cath site has health well. BP limits increase of antianginals today. We will continue ASA, Plavix , Amlodipine , Coreg , Zetia , repatha , SL NTG. We discussed medications options if chest pressure were to return. I will refer her to cardiac rehab.   HLD She has an intolerance to statins. Most recent LDL 71. Continue repatha  and Zetia .   HTN She has h/o labile pressures. BP today 104/60. Continue amlodipine  2.5mg BID and Coreg  6.25mg BID.   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     Cardiac Rehabilitation Eligibility Assessment  The patient is ready to start cardiac rehabilitation from a cardiac standpoint.       Dispo: Follow-up in 3 months  Signed, Tiffany Ruppel VEAR Fishman, PA-C   "

## 2024-04-09 ENCOUNTER — Ambulatory Visit: Payer: Self-pay | Admitting: Medical

## 2024-04-09 LAB — CBC
Hematocrit: 42.1 % (ref 34.0–46.6)
Hemoglobin: 13.6 g/dL (ref 11.1–15.9)
MCH: 29.9 pg (ref 26.6–33.0)
MCHC: 32.3 g/dL (ref 31.5–35.7)
MCV: 93 fL (ref 79–97)
Platelets: 285 x10E3/uL (ref 150–450)
RBC: 4.55 x10E6/uL (ref 3.77–5.28)
RDW: 13.1 % (ref 11.7–15.4)
WBC: 9.9 x10E3/uL (ref 3.4–10.8)

## 2024-04-18 ENCOUNTER — Other Ambulatory Visit: Payer: Self-pay

## 2024-04-18 ENCOUNTER — Encounter: Attending: Cardiology

## 2024-04-18 VITALS — Ht 65.0 in | Wt 103.1 lb

## 2024-04-18 DIAGNOSIS — I214 Non-ST elevation (NSTEMI) myocardial infarction: Secondary | ICD-10-CM | POA: Diagnosis present

## 2024-04-18 DIAGNOSIS — Z5189 Encounter for other specified aftercare: Secondary | ICD-10-CM | POA: Diagnosis not present

## 2024-04-18 NOTE — Progress Notes (Signed)
 Completed program orientation and . Initial ITP created and sent for review to Medical Director. Tiffany Munoz is a current tobacco user. Intervention for tobacco cessation was provided at the initial medical review. She was asked about readiness to quit and reported that she has decreased use to 5-8 cigarettes per day . Patient was advised and educated about tobacco cessation using combination therapy, tobacco cessation classes, quit line, and quit smoking apps. Patient demonstrated understanding of this material. Staff will continue to provide encouragement and follow up with the patient throughout the program.

## 2024-04-18 NOTE — Progress Notes (Signed)
 Cardiac Individual Treatment Plan  Patient Details  Name: Tiffany Munoz MRN: [id number] Date of Birth: 02-16-1946 Referring Provider:   Flowsheet Row Cardiac Rehab from 04/18/2024 in [facility] Cardiac and Pulmonary Rehab  Referring Provider Darron Grass, MD    Initial Encounter Date:  Flowsheet Row Cardiac Rehab from 04/18/2024 in [facility] Cardiac and Pulmonary Rehab  Date 04/18/24    Visit Diagnosis: NSTEMI (non-ST elevation myocardial infarction) [facility])  Patient's Home Medications on Admission: Current Medications[1]  Past Medical History: Past Medical History:  Diagnosis Date   AAA (abdominal aortic aneurysm)    CAD (coronary artery disease)    Emphysema of lung (HCC)    HLD (hyperlipidemia)    Hypertension    Migraine     Tobacco Use: Tobacco Use History[2]  Labs: Review Flowsheet  More data may exist      Latest Ref Rng & Units 03/29/2018 07/09/2022 08/01/2023 10/17/2023 03/30/2024  Labs for ITP Cardiac and Pulmonary Rehab  Cholestrol 0 - 200 mg/dL - 787  808  859  858   LDL (calc) 0 - 99 mg/dL - 871  891  66  71   Direct LDL 0 - 99 mg/dL - - 892  - -  HDL-C >59 mg/dL - 67  62  56  58   Trlycerides <150 mg/dL - 83  881  95  59   Hemoglobin A1c 4.8 - 5.6 % - 5.1  - - -  TCO2 22 - 32 mmol/L 31  - - - -     Exercise Target Goals: Exercise Program Goal: Individual exercise prescription set using results from initial 6 min walk test and THRR while considering  patients activity barriers and safety.   Exercise Prescription Goal: Initial exercise prescription builds to 30-45 minutes a day of aerobic activity, 2-3 days per week.  Home exercise guidelines will be given to patient during program as part of exercise prescription that the participant will acknowledge.   Education: Aerobic Exercise: - Group verbal and visual presentation on the components of exercise prescription. Introduces F.I.T.T principle from ACSM for exercise prescriptions.  Reviews F.I.T.T.  principles of aerobic exercise including progression. Written material provided at class time. Flowsheet Row Cardiac Rehab from 04/18/2024 in [facility] Cardiac and Pulmonary Rehab  Education need identified 04/18/24    Education: Resistance Exercise: - Group verbal and visual presentation on the components of exercise prescription. Introduces F.I.T.T principle from ACSM for exercise prescriptions  Reviews F.I.T.T. principles of resistance exercise including progression. Written material provided at class time.    Education: Exercise & Equipment Safety: - Individual verbal instruction and demonstration of equipment use and safety with use of the equipment. Flowsheet Row Cardiac Rehab from 04/18/2024 in [facility] Cardiac and Pulmonary Rehab  Date 04/18/24  Educator MB  Instruction Review Code 1- Verbalizes Understanding    Education: Exercise Physiology & General Exercise Guidelines: - Group verbal and written instruction with models to review the exercise physiology of the cardiovascular system and associated critical values. Provides general exercise guidelines with specific guidelines to those with heart or lung disease. Written material provided at class time. Flowsheet Row Cardiac Rehab from 10/19/2022 in [facility] Cardiac and Pulmonary Rehab  Education need identified 08/29/22    Education: Flexibility, Balance, Mind/Body Relaxation: - Group verbal and visual presentation with interactive activity on the components of exercise prescription. Introduces F.I.T.T principle from ACSM for exercise prescriptions. Reviews F.I.T.T. principles of flexibility and balance exercise training including progression. Also discusses the mind body connection.  Reviews  various relaxation techniques to help reduce and manage stress (i.e. Deep breathing, progressive muscle relaxation, and visualization). Balance handout provided to take home. Written material provided at class time. Flowsheet Row Cardiac Rehab from 10/19/2022 in  [facility] Cardiac and Pulmonary Rehab  Date 09/28/22  Educator [facility]  Instruction Review Code 1- Verbalizes Understanding    Activity Barriers & Risk Stratification:  Activity Barriers & Cardiac Risk Stratification - 04/18/24 1449       Activity Barriers & Cardiac Risk Stratification   Activity Barriers None    Cardiac Risk Stratification High          6 Minute Walk:  6 Minute Walk     Row Name 04/18/24 1705         6 Minute Walk   Phase Initial     Distance 800 feet     Walk Time 6 minutes     # of Rest Breaks 0     MPH 1.52     METS 2.2     RPE 12     Perceived Dyspnea  1     VO2 Peak 7.76     Symptoms Yes (comment)     Comments L hip pain 3/10     Resting HR 65 bpm     Resting BP 122/80     Resting Oxygen Saturation  98 %     Exercise Oxygen Saturation  during 6 min walk 96 %     Max Ex. HR 94 bpm     Max Ex. BP 138/70     2 Minute Post BP 102/60        Oxygen Initial Assessment:   Oxygen Re-Evaluation:   Oxygen Discharge (Final Oxygen Re-Evaluation):   Initial Exercise Prescription:  Initial Exercise Prescription - 04/18/24 1700       Date of Initial Exercise RX and Referring Provider   Date 04/18/24    Referring Provider Darron Grass, MD      Oxygen   Maintain Oxygen Saturation 88% or higher      Treadmill   MPH 1.5    Grade 0    Minutes 15    METs 2.15      NuStep   Level 2    SPM 80    Minutes 15    METs 2.2      Arm Ergometer   Level 1    RPM 25    Minutes 15    METs 2.2      T5 Nustep   Level 2   T6   SPM 80    Minutes 15    METs 2.2      Track   Laps 20    Minutes 15    METs 2.09      Prescription Details   Frequency (times per week) 2    Duration Progress to 30 minutes of continuous aerobic without signs/symptoms of physical distress      Intensity   THRR 40-80% of Max Heartrate 96-128    Ratings of Perceived Exertion 11-13    Perceived Dyspnea 0-4      Progression   Progression Continue to progress  workloads to maintain intensity without signs/symptoms of physical distress.      Resistance Training   Training Prescription Yes    Weight 5 lb    Reps 10-15          Perform Capillary Blood Glucose checks as needed.  Exercise Prescription Changes:   Exercise Prescription Changes  Row Name 04/18/24 1700             Response to Exercise   Blood Pressure (Admit) 122/80       Blood Pressure (Exercise) 138/70       Blood Pressure (Exit) 102/60       Heart Rate (Admit) 65 bpm       Heart Rate (Exercise) 94 bpm       Heart Rate (Exit) 70 bpm       Oxygen Saturation (Admit) 98 %       Oxygen Saturation (Exercise) 96 %       Oxygen Saturation (Exit) 97 %       Rating of Perceived Exertion (Exercise) 12       Perceived Dyspnea (Exercise) 1       Symptoms L hip pain 3/10       Comments results         Progression   Average METs 2.2          Exercise Comments:   Exercise Goals and Review:   Exercise Goals     Row Name 04/18/24 1709             Exercise Goals   Increase Physical Activity Yes       Intervention Provide advice, education, support and counseling about physical activity/exercise needs.;Develop an individualized exercise prescription for aerobic and resistive training based on initial evaluation findings, risk stratification, comorbidities and participant's personal goals.       Expected Outcomes Short Term: Attend rehab on a regular basis to increase amount of physical activity.;Long Term: Add in home exercise to make exercise part of routine and to increase amount of physical activity.;Long Term: Exercising regularly at least 3-5 days a week.       Increase Strength and Stamina Yes       Intervention Provide advice, education, support and counseling about physical activity/exercise needs.;Develop an individualized exercise prescription for aerobic and resistive training based on initial evaluation findings, risk stratification, comorbidities and  participant's personal goals.       Expected Outcomes Short Term: Increase workloads from initial exercise prescription for resistance, speed, and METs.;Short Term: Perform resistance training exercises routinely during rehab and add in resistance training at home;Long Term: Improve cardiorespiratory fitness, muscular endurance and strength as measured by increased METs and functional capacity ( )       Able to understand and use rate of perceived exertion (RPE) scale Yes       Intervention Provide education and explanation on how to use RPE scale       Expected Outcomes Short Term: Able to use RPE daily in rehab to express subjective intensity level;Long Term:  Able to use RPE to guide intensity level when exercising independently       Able to understand and use Dyspnea scale Yes       Intervention Provide education and explanation on how to use Dyspnea scale       Expected Outcomes Short Term: Able to use Dyspnea scale daily in rehab to express subjective sense of shortness of breath during exertion;Long Term: Able to use Dyspnea scale to guide intensity level when exercising independently       Knowledge and understanding of Target Heart Rate Range (THRR) Yes       Intervention Provide education and explanation of THRR including how the numbers were predicted and where they are located for reference       Expected Outcomes Short Term:  Able to state/look up THRR;Long Term: Able to use THRR to govern intensity when exercising independently;Short Term: Able to use daily as guideline for intensity in rehab       Able to check pulse independently Yes       Intervention Provide education and demonstration on how to check pulse in carotid and radial arteries.;Review the importance of being able to check your own pulse for safety during independent exercise       Expected Outcomes Short Term: Able to explain why pulse checking is important during independent exercise;Long Term: Able to check pulse  independently and accurately       Understanding of Exercise Prescription Yes       Intervention Provide education, explanation, and written materials on patient's individual exercise prescription       Expected Outcomes Short Term: Able to explain program exercise prescription;Long Term: Able to explain home exercise prescription to exercise independently          Exercise Goals Re-Evaluation :   Discharge Exercise Prescription (Final Exercise Prescription Changes):  Exercise Prescription Changes - 04/18/24 1700       Response to Exercise   Blood Pressure (Admit) 122/80    Blood Pressure (Exercise) 138/70    Blood Pressure (Exit) 102/60    Heart Rate (Admit) 65 bpm    Heart Rate (Exercise) 94 bpm    Heart Rate (Exit) 70 bpm    Oxygen Saturation (Admit) 98 %    Oxygen Saturation (Exercise) 96 %    Oxygen Saturation (Exit) 97 %    Rating of Perceived Exertion (Exercise) 12    Perceived Dyspnea (Exercise) 1    Symptoms L hip pain 3/10    Comments results      Progression   Average METs 2.2          Nutrition:  Target Goals: Understanding of nutrition guidelines, daily intake of sodium 1500mg , cholesterol 200mg , calories 30% from fat and 7% or less from saturated fats, daily to have 5 or more servings of fruits and vegetables.  Education: Nutrition 1 -Group instruction provided by verbal, written material, interactive activities, discussions, models, and posters to present general guidelines for heart healthy nutrition including macronutrients, label reading, and promoting whole foods over processed counterparts. Education serves as pensions consultant of discussion of heart healthy eating for all. Written material provided at class time.    Education: Nutrition 2 -Group instruction provided by verbal, written material, interactive activities, discussions, models, and posters to present general guidelines for heart healthy nutrition including sodium, cholesterol, and saturated  fat. Providing guidance of habit forming to improve blood pressure, cholesterol, and body weight. Written material provided at class time.     Biometrics:  Pre Biometrics - 04/18/24 1710       Pre Biometrics   Height 5' 5 (1.651 m)    Weight 103 lb 1.6 oz (46.8 kg)    Waist Circumference 27 inches    Hip Circumference 33.5 inches    Waist to Hip Ratio 0.81 %    BMI (Calculated) 17.16    Single Leg Stand 11.3 seconds           Nutrition Therapy Plan and Nutrition Goals:  Nutrition Therapy & Goals - 04/18/24 1710       Nutrition Therapy   RD appointment deferred Yes      Personal Nutrition Goals   Nutrition Goal RD appointment deferred at this time      Intervention Plan   Intervention Prescribe,  educate and counsel regarding individualized specific dietary modifications aiming towards targeted core components such as weight, hypertension, lipid management, diabetes, heart failure and other comorbidities.    Expected Outcomes Short Term Goal: Understand basic principles of dietary content, such as calories, fat, sodium, cholesterol and nutrients.          Nutrition Assessments:  MEDIFICTS Score Key: >=70 Need to make dietary changes  40-70 Heart Healthy Diet <= 40 Therapeutic Level Cholesterol Diet  Flowsheet Row Cardiac Rehab from 04/18/2024 in [facility] Cardiac and Pulmonary Rehab  Picture Your Plate Total Score on Admission 62   Picture Your Plate Scores: <59 Unhealthy dietary pattern with much room for improvement. 41-50 Dietary pattern unlikely to meet recommendations for good health and room for improvement. 51-60 More healthful dietary pattern, with some room for improvement.  >60 Healthy dietary pattern, although there may be some specific behaviors that could be improved.    Nutrition Goals Re-Evaluation:   Nutrition Goals Discharge (Final Nutrition Goals Re-Evaluation):   Psychosocial: Target Goals: Acknowledge presence or absence of significant  depression and/or stress, maximize coping skills, provide positive support system. Participant is able to verbalize types and ability to use techniques and skills needed for reducing stress and depression.   Education: Stress, Anxiety, and Depression - Group verbal and visual presentation to define topics covered.  Reviews how body is impacted by stress, anxiety, and depression.  Also discusses healthy ways to reduce stress and to treat/manage anxiety and depression. Written material provided at class time.   Education: Sleep Hygiene -Provides group verbal and written instruction about how sleep can affect your health.  Define sleep hygiene, discuss sleep cycles and impact of sleep habits. Review good sleep hygiene tips.   Initial Review & Psychosocial Screening:  Initial Psych Review & Screening - 04/18/24 1443       Initial Review   Current issues with Current Sleep Concerns      Family Dynamics   Good Support System? Yes   daugher, neighbors     Barriers   Psychosocial barriers to participate in program There are no identifiable barriers or psychosocial needs.      Screening Interventions   Interventions Encouraged to exercise;To provide support and resources with identified psychosocial needs;Provide feedback about the scores to participant    Expected Outcomes Short Term goal: Utilizing psychosocial counselor, staff and physician to assist with identification of specific Stressors or current issues interfering with healing process. Setting desired goal for each stressor or current issue identified.;Long Term Goal: Stressors or current issues are controlled or eliminated.;Short Term goal: Identification and review with participant of any Quality of Life or Depression concerns found by scoring the questionnaire.;Long Term goal: The participant improves quality of Life and PHQ9 Scores as seen by post scores and/or verbalization of changes          Quality of Life Scores:   Quality  of Life - 04/18/24 1603       Quality of Life   Select Quality of Life      Quality of Life Scores   Health/Function Pre 15.04 %    Socioeconomic Pre 30 %    Psych/Spiritual Pre 26.29 %    Family Pre 30 %    GLOBAL Pre 22.64 %         Scores of 19 and below usually indicate a poorer quality of life in these areas.  A difference of  2-3 points is a clinically meaningful difference.  A difference of 2-3  points in the total score of the Quality of Life Index has been associated with significant improvement in overall quality of life, self-image, physical symptoms, and general health in studies assessing change in quality of life.  PHQ-9: Review Flowsheet       04/18/2024 11/10/2022 09/28/2022 08/29/2022  Depression screen PHQ 2/9  Decreased Interest 1 0 0 0  Down, Depressed, Hopeless 0 0 0 0  PHQ - 2 Score 1 0 0 0  Altered sleeping 3 3 2 2   Tired, decreased energy 3 3 1 3   Change in appetite 2 1 0 0  Feeling bad or failure about yourself  0 0 0 0  Trouble concentrating 2 0 0 0  Moving slowly or fidgety/restless 0 0 0 0  Suicidal thoughts 0 0 0 0  PHQ-9 Score 11 7  3  5    Difficult doing work/chores Somewhat difficult Not difficult at all Not difficult at all Not difficult at all    Details       Data saved with a previous flowsheet row definition        Interpretation of Total Score  Total Score Depression Severity:  1-4 = Minimal depression, 5-9 = Mild depression, 10-14 = Moderate depression, 15-19 = Moderately severe depression, 20-27 = Severe depression   Psychosocial Evaluation and Intervention:  Psychosocial Evaluation - 04/18/24 1444       Psychosocial Evaluation & Interventions   Interventions Encouraged to exercise with the program and follow exercise prescription;Relaxation education;Stress management education    Comments Kymani is looking forward to starting the program again. She lives alone, but reports she has good neighbors and her daughter who are a good  support system. She states that she enjoys playing games on her phone to relax. 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 She states she is having trouble gaining weight while trying to follow a heart healthy diet. Patient reports that she often has trouble falling asleep and that she wakes up often during the night.    Expected Outcomes Short: Attend cardiac rehab for exercise and education. Long: Develop and maintian healthy self care habits.    Continue Psychosocial Services  Follow up required by staff          Psychosocial Re-Evaluation:   Psychosocial Discharge (Final Psychosocial Re-Evaluation):   Vocational Rehabilitation: Provide vocational rehab assistance to qualifying candidates.   Vocational Rehab Evaluation & Intervention:  Vocational Rehab - 04/18/24 1447       Initial Vocational Rehab Evaluation & Intervention   Assessment shows need for Vocational Rehabilitation No   retired         Education: Education Goals: Education classes will be provided on a variety of topics geared toward better understanding of heart health and risk factor modification. Participant will state understanding/return demonstration of topics presented as noted by education test scores.  Learning Barriers/Preferences:  Learning Barriers/Preferences - 04/18/24 1431       Learning Barriers/Preferences   Learning Barriers None    Learning Preferences Skilled Demonstration          General Cardiac Education Topics:  AED/CPR: - Group verbal and written instruction with the use of models to demonstrate the basic use of the AED with the basic ABC's of resuscitation.   Test and Procedures: - Group verbal and visual presentation and models provide information about basic cardiac anatomy and function. Reviews  the testing methods done to diagnose heart disease and the outcomes of the  test results. Describes the treatment choices: Medical Management, Angioplasty, or Coronary Bypass Surgery for treating various heart conditions including Myocardial Infarction, Angina, Valve Disease, and Cardiac Arrhythmias. Written material provided at class time.   Medication Safety: - Group verbal and visual instruction to review commonly prescribed medications for heart and lung disease. Reviews the medication, class of the drug, and side effects. Includes the steps to properly store meds and maintain the prescription regimen. Written material provided at class time. Flowsheet Row Cardiac Rehab from 10/19/2022 in [facility] Cardiac and Pulmonary Rehab  Date 10/19/22  Educator MS  Instruction Review Code 1- Verbalizes Understanding    Intimacy: - Group verbal instruction through game format to discuss how heart and lung disease can affect sexual intimacy. Written material provided at class time. Flowsheet Row Cardiac Rehab from 10/19/2022 in [facility] Cardiac and Pulmonary Rehab  Date 09/14/22  Educator [facility]  Instruction Review Code 1- Verbalizes Understanding    Know Your Numbers and Heart Failure: - Group verbal and visual instruction to discuss disease risk factors for cardiac and pulmonary disease and treatment options.  Reviews associated critical values for Overweight/Obesity, Hypertension, Cholesterol, and Diabetes.  Discusses basics of heart failure: signs/symptoms and treatments.  Introduces Heart Failure Zone chart for action plan for heart failure. Written material provided at class time.   Infection Prevention: - Provides verbal and written material to individual with discussion of infection control including proper hand washing and proper equipment cleaning during exercise session. Flowsheet Row Cardiac Rehab from 04/18/2024 in [facility] Cardiac and Pulmonary Rehab  Date 04/18/24  Educator MB  Instruction Review Code 1- Verbalizes Understanding    Falls Prevention: - Provides verbal and  written material to individual with discussion of falls prevention and safety. Flowsheet Row Cardiac Rehab from 04/18/2024 in [facility] Cardiac and Pulmonary Rehab  Date 04/18/24  Educator MB  Instruction Review Code 1- Verbalizes Understanding    Other: -Provides group and verbal instruction on various topics (see comments) Flowsheet Row Cardiac Rehab from 10/19/2022 in [facility] Cardiac and Pulmonary Rehab  Date 09/21/22  Educator [facility] [employer] [facility]  [10/05/22- [facility]]  Instruction Review Code 1- Verbalizes Understanding    Knowledge Questionnaire Score:  Knowledge Questionnaire Score - 04/18/24 1508       Knowledge Questionnaire Score   Pre Score 24/26          Core Components/Risk Factors/Patient Goals at Admission:  Personal Goals and Risk Factors at Admission - 04/18/24 1447       Core Components/Risk Factors/Patient Goals on Admission    Weight Management Yes;Weight Gain    Intervention Weight Management: Develop a combined nutrition and exercise program designed to reach desired caloric intake, while maintaining appropriate intake of nutrient and fiber, sodium and fats, and appropriate energy expenditure required for the weight goal.;Weight Management: Provide education and appropriate resources to help participant work on and attain dietary goals.    Admit Weight 103 lb 1.6 oz (46.8 kg)    Goal Weight: Short Term 108 lb (49 kg)    Goal Weight: Long Term 112 lb (50.8 kg)    Expected Outcomes Short Term: Continue to assess and modify interventions until short term weight is achieved;Long Term: Adherence to nutrition and physical activity/exercise program aimed toward attainment of established weight goal;Weight Gain: Understanding of general recommendations for a high calorie, high protein meal plan that promotes weight gain by distributing calorie intake throughout the day with the  consumption for 4-5 meals, snacks, and/or supplements;Understanding recommendations for meals to include  15-35% energy as protein, 25-35% energy from fat, 35-60% energy from carbohydrates, less than 200mg  of dietary cholesterol, 20-35 gm of total fiber daily;Understanding of distribution of calorie intake throughout the day with the consumption of 4-5 meals/snacks    Tobacco Cessation Yes    Number of packs per day 5-8 cigarettes per day.    Intervention Assist the participant in steps to quit. Provide individualized education and counseling about committing to Tobacco Cessation, relapse prevention, and pharmacological support that can be provided by physician.;Education officer, environmental, assist with locating and accessing local/national Quit Smoking programs, and support quit date choice.    Expected Outcomes Short Term: Will demonstrate readiness to quit, by selecting a quit date.;Short Term: Will quit all tobacco product use, adhering to prevention of relapse plan.;Long Term: Complete abstinence from all tobacco products for at least 12 months from quit date.    Hypertension Yes    Intervention Provide education on lifestyle modifcations including regular physical activity/exercise, weight management, moderate sodium restriction and increased consumption of fresh fruit, vegetables, and low fat dairy, alcohol moderation, and smoking cessation.;Monitor prescription use compliance.    Expected Outcomes Short Term: Continued assessment and intervention until BP is < 140/42mm HG in hypertensive participants. < 130/65mm HG in hypertensive participants with diabetes, heart failure or chronic kidney disease.;Long Term: Maintenance of blood pressure at goal levels.    Lipids Yes    Intervention Provide education and support for participant on nutrition & aerobic/resistive exercise along with prescribed medications to achieve LDL 70mg , HDL >40mg .    Expected Outcomes Short Term: Participant states understanding of desired cholesterol values and is compliant with medications prescribed. Participant is following  exercise prescription and nutrition guidelines.;Long Term: Cholesterol controlled with medications as prescribed, with individualized exercise RX and with personalized nutrition plan. Value goals: LDL < 70mg , HDL > 40 mg.          Education:Diabetes - Individual verbal and written instruction to review signs/symptoms of diabetes, desired ranges of glucose level fasting, after meals and with exercise. Acknowledge that pre and post exercise glucose checks will be done for 3 sessions at entry of program.   Core Components/Risk Factors/Patient Goals Review:    Core Components/Risk Factors/Patient Goals at Discharge (Final Review):    ITP Comments:  ITP Comments     Row Name 04/18/24 1442           ITP Comments Completed program orientation and . Initial ITP created and sent for review to Medical Director. Richele is a current tobacco user. Intervention for tobacco cessation was provided at the initial medical review. She was asked about readiness to quit and reported that she has decreased use to 5-8 cigarettes per day . Patient was advised and educated about tobacco cessation using combination therapy, tobacco cessation classes, quit line, and quit smoking apps. Patient demonstrated understanding of this material. Staff will continue to provide encouragement and follow up with the patient throughout the program.          Comments: Initial ITP    [1]  Current Outpatient Medications:    amLODipine  (NORVASC ) 5 MG tablet, Take 0.5 tablets (2.5 mg total) by mouth in the morning and at bedtime., Disp: 90 tablet, Rfl: 3   aspirin  81 MG tablet, Take 2 tablets by mouth daily., Disp: , Rfl:    carvedilol  (COREG ) 6.25 MG tablet, Take by mouth., Disp: , Rfl:    cholecalciferol  (VITAMIN  D3) 25 MCG (1000 UNIT) tablet, Take 500 Units by mouth daily., Disp: , Rfl:    clobetasol cream (TEMOVATE) 0.05 %, Apply 1 Application topically 3 (three) times daily as needed., Disp: , Rfl:    clopidogrel   (PLAVIX ) 75 MG tablet, Take 1 tablet (75 mg total) by mouth daily with breakfast., Disp: 30 tablet, Rfl: 0   estradiol  (ESTRACE ) 0.01 % CREA vaginal cream, Apply one pea-sized amount around the opening of the urethra daily for 2 weeks, then 3 times weekly moving forward., Disp: 43 each, Rfl: 12   ezetimibe  (ZETIA ) 10 MG tablet, Take 1 tablet (10 mg total) by mouth daily., Disp: 30 tablet, Rfl: 0   Multiple Vitamin (MULTIVITAMIN) tablet, Take 1 tablet by mouth daily., Disp: , Rfl:    nitroGLYCERIN  (NITROSTAT ) 0.4 MG SL tablet, Place 1 tablet (0.4 mg total) under the tongue every 5 (five) minutes x 3 doses as needed for chest pain., Disp: 30 tablet, Rfl: 0   potassium chloride  (MICRO-K ) 10 MEQ CR capsule, Take 10 mEq by mouth daily., Disp: , Rfl:    REPATHA  SURECLICK 140 MG/ML SOAJ, ADMINISTER 1 ML UNDER THE SKIN EVERY 14 DAYS, Disp: 6 mL, Rfl: 3 [2]  Social History Tobacco Use  Smoking Status Every Day   Current packs/day: 0.50   Average packs/day: 0.5 packs/day for 55.0 years (27.5 ttl pk-yrs)   Types: Cigarettes  Smokeless Tobacco Never  Tobacco Comments   5 cigarettes a day working toward cessation

## 2024-04-18 NOTE — Patient Instructions (Signed)
 Patient Instructions  Patient Details  Name: Tiffany Munoz MRN: [id number] Date of Birth: 14-Dec-1945 Referring Provider:  Darron Deatrice LABOR, MD  Below are your personal goals for exercise, nutrition, and risk factors. Our goal is to help you stay on track towards obtaining and maintaining these goals. We will be discussing your progress on these goals with you throughout the program.  Initial Exercise Prescription:  Initial Exercise Prescription - 04/18/24 1700       Date of Initial Exercise RX and Referring Provider   Date 04/18/24    Referring Provider Darron Deatrice, MD      Oxygen   Maintain Oxygen Saturation 88% or higher      Treadmill   MPH 1.5    Grade 0    Minutes 15    METs 2.15      NuStep   Level 2    SPM 80    Minutes 15    METs 2.2      Arm Ergometer   Level 1    RPM 25    Minutes 15    METs 2.2      T5 Nustep   Level 2   T6   SPM 80    Minutes 15    METs 2.2      Track   Laps 20    Minutes 15    METs 2.09      Prescription Details   Frequency (times per week) 2    Duration Progress to 30 minutes of continuous aerobic without signs/symptoms of physical distress      Intensity   THRR 40-80% of Max Heartrate 96-128    Ratings of Perceived Exertion 11-13    Perceived Dyspnea 0-4      Progression   Progression Continue to progress workloads to maintain intensity without signs/symptoms of physical distress.      Resistance Training   Training Prescription Yes    Weight 5 lb    Reps 10-15          Exercise Goals: Frequency: Be able to perform aerobic exercise two to three times per week in program working toward 2-5 days per week of home exercise.  Intensity: Work with a perceived exertion of 11 (fairly light) - 15 (hard) while following your exercise prescription.  We will make changes to your prescription with you as you progress through the program.   Duration: Be able to do 30 to 45 minutes of continuous aerobic exercise  in addition to a 5 minute warm-up and a 5 minute cool-down routine.   Nutrition Goals: Your personal nutrition goals will be established when you do your nutrition analysis with the dietician.  The following are general nutrition guidelines to follow: Cholesterol < 200mg /day Sodium < 1500mg /day Fiber: Women over 50 yrs - 21 grams per day  Personal Goals:  Personal Goals and Risk Factors at Admission - 04/18/24 1447       Core Components/Risk Factors/Patient Goals on Admission    Weight Management Yes;Weight Gain    Intervention Weight Management: Develop a combined nutrition and exercise program designed to reach desired caloric intake, while maintaining appropriate intake of nutrient and fiber, sodium and fats, and appropriate energy expenditure required for the weight goal.;Weight Management: Provide education and appropriate resources to help participant work on and attain dietary goals.    Admit Weight 103 lb 1.6 oz (46.8 kg)    Goal Weight: Short Term 108 lb (49 kg)    Goal Weight: Long  Term 112 lb (50.8 kg)    Expected Outcomes Short Term: Continue to assess and modify interventions until short term weight is achieved;Long Term: Adherence to nutrition and physical activity/exercise program aimed toward attainment of established weight goal;Weight Gain: Understanding of general recommendations for a high calorie, high protein meal plan that promotes weight gain by distributing calorie intake throughout the day with the consumption for 4-5 meals, snacks, and/or supplements;Understanding recommendations for meals to include 15-35% energy as protein, 25-35% energy from fat, 35-60% energy from carbohydrates, less than 200mg  of dietary cholesterol, 20-35 gm of total fiber daily;Understanding of distribution of calorie intake throughout the day with the consumption of 4-5 meals/snacks    Tobacco Cessation Yes    Number of packs per day 5-8 cigarettes per day.    Intervention Assist the  participant in steps to quit. Provide individualized education and counseling about committing to Tobacco Cessation, relapse prevention, and pharmacological support that can be provided by physician.;Education officer, environmental, assist with locating and accessing local/national Quit Smoking programs, and support quit date choice.    Expected Outcomes Short Term: Will demonstrate readiness to quit, by selecting a quit date.;Short Term: Will quit all tobacco product use, adhering to prevention of relapse plan.;Long Term: Complete abstinence from all tobacco products for at least 12 months from quit date.    Hypertension Yes    Intervention Provide education on lifestyle modifcations including regular physical activity/exercise, weight management, moderate sodium restriction and increased consumption of fresh fruit, vegetables, and low fat dairy, alcohol moderation, and smoking cessation.;Monitor prescription use compliance.    Expected Outcomes Short Term: Continued assessment and intervention until BP is < 140/19mm HG in hypertensive participants. < 130/58mm HG in hypertensive participants with diabetes, heart failure or chronic kidney disease.;Long Term: Maintenance of blood pressure at goal levels.    Lipids Yes    Intervention Provide education and support for participant on nutrition & aerobic/resistive exercise along with prescribed medications to achieve LDL 70mg , HDL >40mg .    Expected Outcomes Short Term: Participant states understanding of desired cholesterol values and is compliant with medications prescribed. Participant is following exercise prescription and nutrition guidelines.;Long Term: Cholesterol controlled with medications as prescribed, with individualized exercise RX and with personalized nutrition plan. Value goals: LDL < 70mg , HDL > 40 mg.          Tobacco Use Initial Evaluation: Social History   Tobacco Use  Smoking Status Every Day   Current packs/day: 0.50   Average  packs/day: 0.5 packs/day for 55.0 years (27.5 ttl pk-yrs)   Types: Cigarettes  Smokeless Tobacco Never  Tobacco Comments   5 cigarettes a day working toward cessation    Exercise Goals and Review:  Exercise Goals     Row Name 04/18/24 1709             Exercise Goals   Increase Physical Activity Yes       Intervention Provide advice, education, support and counseling about physical activity/exercise needs.;Develop an individualized exercise prescription for aerobic and resistive training based on initial evaluation findings, risk stratification, comorbidities and participant's personal goals.       Expected Outcomes Short Term: Attend rehab on a regular basis to increase amount of physical activity.;Long Term: Add in home exercise to make exercise part of routine and to increase amount of physical activity.;Long Term: Exercising regularly at least 3-5 days a week.       Increase Strength and Stamina Yes       Intervention Provide  advice, education, support and counseling about physical activity/exercise needs.;Develop an individualized exercise prescription for aerobic and resistive training based on initial evaluation findings, risk stratification, comorbidities and participant's personal goals.       Expected Outcomes Short Term: Increase workloads from initial exercise prescription for resistance, speed, and METs.;Short Term: Perform resistance training exercises routinely during rehab and add in resistance training at home;Long Term: Improve cardiorespiratory fitness, muscular endurance and strength as measured by increased METs and functional capacity ( )       Able to understand and use rate of perceived exertion (RPE) scale Yes       Intervention Provide education and explanation on how to use RPE scale       Expected Outcomes Short Term: Able to use RPE daily in rehab to express subjective intensity level;Long Term:  Able to use RPE to guide intensity level when exercising  independently       Able to understand and use Dyspnea scale Yes       Intervention Provide education and explanation on how to use Dyspnea scale       Expected Outcomes Short Term: Able to use Dyspnea scale daily in rehab to express subjective sense of shortness of breath during exertion;Long Term: Able to use Dyspnea scale to guide intensity level when exercising independently       Knowledge and understanding of Target Heart Rate Range (THRR) Yes       Intervention Provide education and explanation of THRR including how the numbers were predicted and where they are located for reference       Expected Outcomes Short Term: Able to state/look up THRR;Long Term: Able to use THRR to govern intensity when exercising independently;Short Term: Able to use daily as guideline for intensity in rehab       Able to check pulse independently Yes       Intervention Provide education and demonstration on how to check pulse in carotid and radial arteries.;Review the importance of being able to check your own pulse for safety during independent exercise       Expected Outcomes Short Term: Able to explain why pulse checking is important during independent exercise;Long Term: Able to check pulse independently and accurately       Understanding of Exercise Prescription Yes       Intervention Provide education, explanation, and written materials on patient's individual exercise prescription       Expected Outcomes Short Term: Able to explain program exercise prescription;Long Term: Able to explain home exercise prescription to exercise independently

## 2024-04-22 ENCOUNTER — Encounter: Admitting: Emergency Medicine

## 2024-04-22 DIAGNOSIS — I214 Non-ST elevation (NSTEMI) myocardial infarction: Secondary | ICD-10-CM | POA: Diagnosis not present

## 2024-04-22 NOTE — Progress Notes (Signed)
 Daily Session Note  Patient Details  Name: Tiffany Munoz MRN: [id number] Date of Birth: 1946/01/24 Referring Provider:   Flowsheet Row Cardiac Rehab from 04/18/2024 in [facility]  Referring Provider Darron Grass, MD    Encounter Date: 04/22/2024  Check In:  Session Check In - 04/22/24 1522       Check-In   Supervising physician immediately available to respond to emergencies See telemetry face sheet for immediately available ER MD    Location [facility]-Cardiac & Pulmonary Rehab    Staff Present Leita Franks RN,BSN;[facility] BS, Exercise Physiologist;Margaret Best, MS, Exercise Physiologist    Virtual Visit No    Medication changes reported     No    Fall or balance concerns reported    No    Tobacco Cessation No Change    Warm-up and Cool-down Performed on first and last piece of equipment    Resistance Training Performed Yes    VAD Patient? No    PAD/SET Patient? No      Pain Assessment   Currently in Pain? No/denies             Tobacco Use History[1]  Goals Met:  Independence with exercise equipment Exercise tolerated well No report of concerns or symptoms today Strength training completed today  Goals Unmet:  Not Applicable  Comments: First full day of exercise!  Patient was oriented to gym and equipment including functions, settings, policies, and procedures.  Patient's individual exercise prescription and treatment plan were reviewed.  All starting workloads were established based on the results of the 6 minute walk test done at initial orientation visit.  The plan for exercise progression was also introduced and progression will be customized based on patient's performance and goals.    Dr. Oneil Pinal is Medical Director for [facility] Cardiac Rehabilitation.  Dr. Fuad Aleskerov is Medical Director for [facility] Pulmonary Rehabilitation.    [1]  Social History Tobacco Use  Smoking Status Every Day    Current packs/day: 0.50   Average packs/day: 0.5 packs/day for 55.0 years (27.5 ttl pk-yrs)   Types: Cigarettes  Smokeless Tobacco Never  Tobacco Comments   5 cigarettes a day working toward cessation

## 2024-04-24 ENCOUNTER — Encounter: Payer: Self-pay | Admitting: *Deleted

## 2024-04-24 DIAGNOSIS — I214 Non-ST elevation (NSTEMI) myocardial infarction: Secondary | ICD-10-CM

## 2024-04-24 NOTE — Progress Notes (Signed)
 Cardiac Individual Treatment Plan  Patient Details  Name: Tiffany Munoz MRN: [id number] Date of Birth: 11-Mar-1946 Referring Provider:   Flowsheet Row Cardiac Rehab from 04/18/2024 in [facility] Cardiac and Pulmonary Rehab  Referring Provider Darron Grass, MD    Initial Encounter Date:  Flowsheet Row Cardiac Rehab from 04/18/2024 in [facility] Cardiac and Pulmonary Rehab  Date 04/18/24    Visit Diagnosis: NSTEMI (non-ST elevation myocardial infarction) [facility])  Patient's Home Medications on Admission: Current Medications[1]  Past Medical History: Past Medical History:  Diagnosis Date   AAA (abdominal aortic aneurysm)    CAD (coronary artery disease)    Emphysema of lung (HCC)    HLD (hyperlipidemia)    Hypertension    Migraine     Tobacco Use: Tobacco Use History[2]  Labs: Review Flowsheet  More data may exist      Latest Ref Rng & Units 03/29/2018 07/09/2022 08/01/2023 10/17/2023 03/30/2024  Labs for ITP Cardiac and Pulmonary Rehab  Cholestrol 0 - 200 mg/dL - 787  808  859  858   LDL (calc) 0 - 99 mg/dL - 871  891  66  71   Direct LDL 0 - 99 mg/dL - - 892  - -  HDL-C >59 mg/dL - 67  62  56  58   Trlycerides <150 mg/dL - 83  881  95  59   Hemoglobin A1c 4.8 - 5.6 % - 5.1  - - -  TCO2 22 - 32 mmol/L 31  - - - -     Exercise Target Goals: Exercise Program Goal: Individual exercise prescription set using results from initial 6 min walk test and THRR while considering  patients activity barriers and safety.   Exercise Prescription Goal: Initial exercise prescription builds to 30-45 minutes a day of aerobic activity, 2-3 days per week.  Home exercise guidelines will be given to patient during program as part of exercise prescription that the participant will acknowledge.   Education: Aerobic Exercise: - Group verbal and visual presentation on the components of exercise prescription. Introduces F.I.T.T principle from ACSM for exercise prescriptions.  Reviews F.I.T.T.  principles of aerobic exercise including progression. Written material provided at class time. Flowsheet Row Cardiac Rehab from 04/18/2024 in [facility] Cardiac and Pulmonary Rehab  Education need identified 04/18/24    Education: Resistance Exercise: - Group verbal and visual presentation on the components of exercise prescription. Introduces F.I.T.T principle from ACSM for exercise prescriptions  Reviews F.I.T.T. principles of resistance exercise including progression. Written material provided at class time.    Education: Exercise & Equipment Safety: - Individual verbal instruction and demonstration of equipment use and safety with use of the equipment. Flowsheet Row Cardiac Rehab from 04/18/2024 in [facility] Cardiac and Pulmonary Rehab  Date 04/18/24  Educator MB  Instruction Review Code 1- Verbalizes Understanding    Education: Exercise Physiology & General Exercise Guidelines: - Group verbal and written instruction with models to review the exercise physiology of the cardiovascular system and associated critical values. Provides general exercise guidelines with specific guidelines to those with heart or lung disease. Written material provided at class time. Flowsheet Row Cardiac Rehab from 10/19/2022 in [facility] Cardiac and Pulmonary Rehab  Education need identified 08/29/22    Education: Flexibility, Balance, Mind/Body Relaxation: - Group verbal and visual presentation with interactive activity on the components of exercise prescription. Introduces F.I.T.T principle from ACSM for exercise prescriptions. Reviews F.I.T.T. principles of flexibility and balance exercise training including progression. Also discusses the mind body connection.  Reviews  various relaxation techniques to help reduce and manage stress (i.e. Deep breathing, progressive muscle relaxation, and visualization). Balance handout provided to take home. Written material provided at class time. Flowsheet Row Cardiac Rehab from 10/19/2022 in  [facility] Cardiac and Pulmonary Rehab  Date 09/28/22  Educator [facility]  Instruction Review Code 1- Verbalizes Understanding    Activity Barriers & Risk Stratification:  Activity Barriers & Cardiac Risk Stratification - 04/18/24 1449       Activity Barriers & Cardiac Risk Stratification   Activity Barriers None    Cardiac Risk Stratification High          6 Minute Walk:  6 Minute Walk     Row Name 04/18/24 1705         6 Minute Walk   Phase Initial     Distance 800 feet     Walk Time 6 minutes     # of Rest Breaks 0     MPH 1.52     METS 2.2     RPE 12     Perceived Dyspnea  1     VO2 Peak 7.76     Symptoms Yes (comment)     Comments L hip pain 3/10     Resting HR 65 bpm     Resting BP 122/80     Resting Oxygen Saturation  98 %     Exercise Oxygen Saturation  during 6 min walk 96 %     Max Ex. HR 94 bpm     Max Ex. BP 138/70     2 Minute Post BP 102/60        Oxygen Initial Assessment:   Oxygen Re-Evaluation:   Oxygen Discharge (Final Oxygen Re-Evaluation):   Initial Exercise Prescription:  Initial Exercise Prescription - 04/18/24 1700       Date of Initial Exercise RX and Referring Provider   Date 04/18/24    Referring Provider Darron Grass, MD      Oxygen   Maintain Oxygen Saturation 88% or higher      Treadmill   MPH 1.5    Grade 0    Minutes 15    METs 2.15      NuStep   Level 2    SPM 80    Minutes 15    METs 2.2      Arm Ergometer   Level 1    RPM 25    Minutes 15    METs 2.2      T5 Nustep   Level 2   T6   SPM 80    Minutes 15    METs 2.2      Track   Laps 20    Minutes 15    METs 2.09      Prescription Details   Frequency (times per week) 2    Duration Progress to 30 minutes of continuous aerobic without signs/symptoms of physical distress      Intensity   THRR 40-80% of Max Heartrate 96-128    Ratings of Perceived Exertion 11-13    Perceived Dyspnea 0-4      Progression   Progression Continue to progress  workloads to maintain intensity without signs/symptoms of physical distress.      Resistance Training   Training Prescription Yes    Weight 5 lb    Reps 10-15          Perform Capillary Blood Glucose checks as needed.  Exercise Prescription Changes:   Exercise Prescription Changes  Row Name 04/18/24 1700             Response to Exercise   Blood Pressure (Admit) 122/80       Blood Pressure (Exercise) 138/70       Blood Pressure (Exit) 102/60       Heart Rate (Admit) 65 bpm       Heart Rate (Exercise) 94 bpm       Heart Rate (Exit) 70 bpm       Oxygen Saturation (Admit) 98 %       Oxygen Saturation (Exercise) 96 %       Oxygen Saturation (Exit) 97 %       Rating of Perceived Exertion (Exercise) 12       Perceived Dyspnea (Exercise) 1       Symptoms L hip pain 3/10       Comments results         Progression   Average METs 2.2          Exercise Comments:   Exercise Comments     Row Name 04/22/24 1523           Exercise Comments First full day of exercise!  Patient was oriented to gym and equipment including functions, settings, policies, and procedures.  Patient's individual exercise prescription and treatment plan were reviewed.  All starting workloads were established based on the results of the 6 minute walk test done at initial orientation visit.  The plan for exercise progression was also introduced and progression will be customized based on patient's performance and goals.          Exercise Goals and Review:   Exercise Goals     Row Name 04/18/24 1709             Exercise Goals   Increase Physical Activity Yes       Intervention Provide advice, education, support and counseling about physical activity/exercise needs.;Develop an individualized exercise prescription for aerobic and resistive training based on initial evaluation findings, risk stratification, comorbidities and participant's personal goals.       Expected Outcomes Short  Term: Attend rehab on a regular basis to increase amount of physical activity.;Long Term: Add in home exercise to make exercise part of routine and to increase amount of physical activity.;Long Term: Exercising regularly at least 3-5 days a week.       Increase Strength and Stamina Yes       Intervention Provide advice, education, support and counseling about physical activity/exercise needs.;Develop an individualized exercise prescription for aerobic and resistive training based on initial evaluation findings, risk stratification, comorbidities and participant's personal goals.       Expected Outcomes Short Term: Increase workloads from initial exercise prescription for resistance, speed, and METs.;Short Term: Perform resistance training exercises routinely during rehab and add in resistance training at home;Long Term: Improve cardiorespiratory fitness, muscular endurance and strength as measured by increased METs and functional capacity ( )       Able to understand and use rate of perceived exertion (RPE) scale Yes       Intervention Provide education and explanation on how to use RPE scale       Expected Outcomes Short Term: Able to use RPE daily in rehab to express subjective intensity level;Long Term:  Able to use RPE to guide intensity level when exercising independently       Able to understand and use Dyspnea scale Yes       Intervention Provide education  and explanation on how to use Dyspnea scale       Expected Outcomes Short Term: Able to use Dyspnea scale daily in rehab to express subjective sense of shortness of breath during exertion;Long Term: Able to use Dyspnea scale to guide intensity level when exercising independently       Knowledge and understanding of Target Heart Rate Range (THRR) Yes       Intervention Provide education and explanation of THRR including how the numbers were predicted and where they are located for reference       Expected Outcomes Short Term: Able to  state/look up THRR;Long Term: Able to use THRR to govern intensity when exercising independently;Short Term: Able to use daily as guideline for intensity in rehab       Able to check pulse independently Yes       Intervention Provide education and demonstration on how to check pulse in carotid and radial arteries.;Review the importance of being able to check your own pulse for safety during independent exercise       Expected Outcomes Short Term: Able to explain why pulse checking is important during independent exercise;Long Term: Able to check pulse independently and accurately       Understanding of Exercise Prescription Yes       Intervention Provide education, explanation, and written materials on patient's individual exercise prescription       Expected Outcomes Short Term: Able to explain program exercise prescription;Long Term: Able to explain home exercise prescription to exercise independently          Exercise Goals Re-Evaluation :  Exercise Goals Re-Evaluation     Row Name 04/22/24 1523             Exercise Goal Re-Evaluation   Exercise Goals Review Increase Physical Activity;Able to understand and use rate of perceived exertion (RPE) scale;Knowledge and understanding of Target Heart Rate Range (THRR);Understanding of Exercise Prescription;Increase Strength and Stamina;Able to understand and use Dyspnea scale;Able to check pulse independently       Comments Reviewed RPE and dyspnea scale, THR and program prescription with pt today.  Pt voiced understanding and was given a copy of goals to take home.       Expected Outcomes Short: Use RPE daily to regulate intensity.  Long: Follow program prescription in THR.          Discharge Exercise Prescription (Final Exercise Prescription Changes):  Exercise Prescription Changes - 04/18/24 1700       Response to Exercise   Blood Pressure (Admit) 122/80    Blood Pressure (Exercise) 138/70    Blood Pressure (Exit) 102/60    Heart Rate  (Admit) 65 bpm    Heart Rate (Exercise) 94 bpm    Heart Rate (Exit) 70 bpm    Oxygen Saturation (Admit) 98 %    Oxygen Saturation (Exercise) 96 %    Oxygen Saturation (Exit) 97 %    Rating of Perceived Exertion (Exercise) 12    Perceived Dyspnea (Exercise) 1    Symptoms L hip pain 3/10    Comments results      Progression   Average METs 2.2          Nutrition:  Target Goals: Understanding of nutrition guidelines, daily intake of sodium 1500mg , cholesterol 200mg , calories 30% from fat and 7% or less from saturated fats, daily to have 5 or more servings of fruits and vegetables.  Education: Nutrition 1 -Group instruction provided by verbal, written material, interactive activities, discussions,  models, and posters to present general guidelines for heart healthy nutrition including macronutrients, label reading, and promoting whole foods over processed counterparts. Education serves as pensions consultant of discussion of heart healthy eating for all. Written material provided at class time.    Education: Nutrition 2 -Group instruction provided by verbal, written material, interactive activities, discussions, models, and posters to present general guidelines for heart healthy nutrition including sodium, cholesterol, and saturated fat. Providing guidance of habit forming to improve blood pressure, cholesterol, and body weight. Written material provided at class time.     Biometrics:  Pre Biometrics - 04/18/24 1710       Pre Biometrics   Height 5' 5 (1.651 m)    Weight 103 lb 1.6 oz (46.8 kg)    Waist Circumference 27 inches    Hip Circumference 33.5 inches    Waist to Hip Ratio 0.81 %    BMI (Calculated) 17.16    Single Leg Stand 11.3 seconds           Nutrition Therapy Plan and Nutrition Goals:  Nutrition Therapy & Goals - 04/18/24 1710       Nutrition Therapy   RD appointment deferred Yes      Personal Nutrition Goals   Nutrition Goal RD appointment deferred at  this time      Intervention Plan   Intervention Prescribe, educate and counsel regarding individualized specific dietary modifications aiming towards targeted core components such as weight, hypertension, lipid management, diabetes, heart failure and other comorbidities.    Expected Outcomes Short Term Goal: Understand basic principles of dietary content, such as calories, fat, sodium, cholesterol and nutrients.          Nutrition Assessments:  MEDIFICTS Score Key: >=70 Need to make dietary changes  40-70 Heart Healthy Diet <= 40 Therapeutic Level Cholesterol Diet  Flowsheet Row Cardiac Rehab from 04/18/2024 in [facility] Cardiac and Pulmonary Rehab  Picture Your Plate Total Score on Admission 62   Picture Your Plate Scores: <59 Unhealthy dietary pattern with much room for improvement. 41-50 Dietary pattern unlikely to meet recommendations for good health and room for improvement. 51-60 More healthful dietary pattern, with some room for improvement.  >60 Healthy dietary pattern, although there may be some specific behaviors that could be improved.    Nutrition Goals Re-Evaluation:   Nutrition Goals Discharge (Final Nutrition Goals Re-Evaluation):   Psychosocial: Target Goals: Acknowledge presence or absence of significant depression and/or stress, maximize coping skills, provide positive support system. Participant is able to verbalize types and ability to use techniques and skills needed for reducing stress and depression.   Education: Stress, Anxiety, and Depression - Group verbal and visual presentation to define topics covered.  Reviews how body is impacted by stress, anxiety, and depression.  Also discusses healthy ways to reduce stress and to treat/manage anxiety and depression. Written material provided at class time.   Education: Sleep Hygiene -Provides group verbal and written instruction about how sleep can affect your health.  Define sleep hygiene, discuss sleep cycles and  impact of sleep habits. Review good sleep hygiene tips.   Initial Review & Psychosocial Screening:  Initial Psych Review & Screening - 04/18/24 1443       Initial Review   Current issues with Current Sleep Concerns      Family Dynamics   Good Support System? Yes   daugher, neighbors     Barriers   Psychosocial barriers to participate in program There are no identifiable barriers or psychosocial needs.  Screening Interventions   Interventions Encouraged to exercise;To provide support and resources with identified psychosocial needs;Provide feedback about the scores to participant    Expected Outcomes Short Term goal: Utilizing psychosocial counselor, staff and physician to assist with identification of specific Stressors or current issues interfering with healing process. Setting desired goal for each stressor or current issue identified.;Long Term Goal: Stressors or current issues are controlled or eliminated.;Short Term goal: Identification and review with participant of any Quality of Life or Depression concerns found by scoring the questionnaire.;Long Term goal: The participant improves quality of Life and PHQ9 Scores as seen by post scores and/or verbalization of changes          Quality of Life Scores:   Quality of Life - 04/18/24 1603       Quality of Life   Select Quality of Life      Quality of Life Scores   Health/Function Pre 15.04 %    Socioeconomic Pre 30 %    Psych/Spiritual Pre 26.29 %    Family Pre 30 %    GLOBAL Pre 22.64 %         Scores of 19 and below usually indicate a poorer quality of life in these areas.  A difference of  2-3 points is a clinically meaningful difference.  A difference of 2-3 points in the total score of the Quality of Life Index has been associated with significant improvement in overall quality of life, self-image, physical symptoms, and general health in studies assessing change in quality of life.  PHQ-9: Review Flowsheet        04/18/2024 11/10/2022 09/28/2022 08/29/2022  Depression screen PHQ 2/9  Decreased Interest 1 0 0 0  Down, Depressed, Hopeless 0 0 0 0  PHQ - 2 Score 1 0 0 0  Altered sleeping 3 3 2 2   Tired, decreased energy 3 3 1 3   Change in appetite 2 1 0 0  Feeling bad or failure about yourself  0 0 0 0  Trouble concentrating 2 0 0 0  Moving slowly or fidgety/restless 0 0 0 0  Suicidal thoughts 0 0 0 0  PHQ-9 Score 11 7  3  5    Difficult doing work/chores Somewhat difficult Not difficult at all Not difficult at all Not difficult at all    Details       Data saved with a previous flowsheet row definition        Interpretation of Total Score  Total Score Depression Severity:  1-4 = Minimal depression, 5-9 = Mild depression, 10-14 = Moderate depression, 15-19 = Moderately severe depression, 20-27 = Severe depression   Psychosocial Evaluation and Intervention:  Psychosocial Evaluation - 04/18/24 1444       Psychosocial Evaluation & Interventions   Interventions Encouraged to exercise with the program and follow exercise prescription;Relaxation education;Stress management education    Comments Milayna is looking forward to starting the program again. She lives alone, but reports she has good neighbors and her daughter who are a good support system. She states that she enjoys playing games on her phone to relax. 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 She states she is having trouble gaining weight while trying to follow a heart healthy diet. Patient reports that she often has trouble falling asleep and that she wakes up often during the night.    Expected Outcomes Short: Attend  cardiac rehab for exercise and education. Long: Develop and maintian healthy self care habits.    Continue Psychosocial Services  Follow up required by staff          Psychosocial Re-Evaluation:   Psychosocial  Discharge (Final Psychosocial Re-Evaluation):   Vocational Rehabilitation: Provide vocational rehab assistance to qualifying candidates.   Vocational Rehab Evaluation & Intervention:  Vocational Rehab - 04/18/24 1447       Initial Vocational Rehab Evaluation & Intervention   Assessment shows need for Vocational Rehabilitation No   retired         Education: Education Goals: Education classes will be provided on a variety of topics geared toward better understanding of heart health and risk factor modification. Participant will state understanding/return demonstration of topics presented as noted by education test scores.  Learning Barriers/Preferences:  Learning Barriers/Preferences - 04/18/24 1431       Learning Barriers/Preferences   Learning Barriers None    Learning Preferences Skilled Demonstration          General Cardiac Education Topics:  AED/CPR: - Group verbal and written instruction with the use of models to demonstrate the basic use of the AED with the basic ABC's of resuscitation.   Test and Procedures: - Group verbal and visual presentation and models provide information about basic cardiac anatomy and function. Reviews the testing methods done to diagnose heart disease and the outcomes of the test results. Describes the treatment choices: Medical Management, Angioplasty, or Coronary Bypass Surgery for treating various heart conditions including Myocardial Infarction, Angina, Valve Disease, and Cardiac Arrhythmias. Written material provided at class time.   Medication Safety: - Group verbal and visual instruction to review commonly prescribed medications for heart and lung disease. Reviews the medication, class of the drug, and side effects. Includes the steps to properly store meds and maintain the prescription regimen. Written material provided at class time. Flowsheet Row Cardiac Rehab from 10/19/2022 in [facility] Cardiac and Pulmonary Rehab  Date 10/19/22   Educator MS  Instruction Review Code 1- Verbalizes Understanding    Intimacy: - Group verbal instruction through game format to discuss how heart and lung disease can affect sexual intimacy. Written material provided at class time. Flowsheet Row Cardiac Rehab from 10/19/2022 in [facility] Cardiac and Pulmonary Rehab  Date 09/14/22  Educator [facility]  Instruction Review Code 1- Verbalizes Understanding    Know Your Numbers and Heart Failure: - Group verbal and visual instruction to discuss disease risk factors for cardiac and pulmonary disease and treatment options.  Reviews associated critical values for Overweight/Obesity, Hypertension, Cholesterol, and Diabetes.  Discusses basics of heart failure: signs/symptoms and treatments.  Introduces Heart Failure Zone chart for action plan for heart failure. Written material provided at class time.   Infection Prevention: - Provides verbal and written material to individual with discussion of infection control including proper hand washing and proper equipment cleaning during exercise session. Flowsheet Row Cardiac Rehab from 04/18/2024 in [facility] Cardiac and Pulmonary Rehab  Date 04/18/24  Educator MB  Instruction Review Code 1- Verbalizes Understanding    Falls Prevention: - Provides verbal and written material to individual with discussion of falls prevention and safety. Flowsheet Row Cardiac Rehab from 04/18/2024 in [facility] Cardiac and Pulmonary Rehab  Date 04/18/24  Educator MB  Instruction Review Code 1- Verbalizes Understanding    Other: -Provides group and verbal instruction on various topics (see comments) Flowsheet Row Cardiac Rehab from 10/19/2022 in [facility] Cardiac and Pulmonary Rehab  Date 09/21/22  Educator Relaxation- [facility]  [  10/05/22- [facility]]  Instruction Review Code 1- Verbalizes Understanding    Knowledge Questionnaire Score:  Knowledge Questionnaire Score - 04/18/24 1508       Knowledge Questionnaire Score   Pre Score 24/26           Core Components/Risk Factors/Patient Goals at Admission:  Personal Goals and Risk Factors at Admission - 04/18/24 1447       Core Components/Risk Factors/Patient Goals on Admission    Weight Management Yes;Weight Gain    Intervention Weight Management: Develop a combined nutrition and exercise program designed to reach desired caloric intake, while maintaining appropriate intake of nutrient and fiber, sodium and fats, and appropriate energy expenditure required for the weight goal.;Weight Management: Provide education and appropriate resources to help participant work on and attain dietary goals.    Admit Weight 103 lb 1.6 oz (46.8 kg)    Goal Weight: Short Term 108 lb (49 kg)    Goal Weight: Long Term 112 lb (50.8 kg)    Expected Outcomes Short Term: Continue to assess and modify interventions until short term weight is achieved;Long Term: Adherence to nutrition and physical activity/exercise program aimed toward attainment of established weight goal;Weight Gain: Understanding of general recommendations for a high calorie, high protein meal plan that promotes weight gain by distributing calorie intake throughout the day with the consumption for 4-5 meals, snacks, and/or supplements;Understanding recommendations for meals to include 15-35% energy as protein, 25-35% energy from fat, 35-60% energy from carbohydrates, less than 200mg  of dietary cholesterol, 20-35 gm of total fiber daily;Understanding of distribution of calorie intake throughout the day with the consumption of 4-5 meals/snacks    Tobacco Cessation Yes    Number of packs per day 5-8 cigarettes per day.    Intervention Assist the participant in steps to quit. Provide individualized education and counseling about committing to Tobacco Cessation, relapse prevention, and pharmacological support that can be provided by physician.;Education officer, environmental, assist with locating and accessing local/national Quit Smoking programs, and  support quit date choice.    Expected Outcomes Short Term: Will demonstrate readiness to quit, by selecting a quit date.;Short Term: Will quit all tobacco product use, adhering to prevention of relapse plan.;Long Term: Complete abstinence from all tobacco products for at least 12 months from quit date.    Hypertension Yes    Intervention Provide education on lifestyle modifcations including regular physical activity/exercise, weight management, moderate sodium restriction and increased consumption of fresh fruit, vegetables, and low fat dairy, alcohol moderation, and smoking cessation.;Monitor prescription use compliance.    Expected Outcomes Short Term: Continued assessment and intervention until BP is < 140/70mm HG in hypertensive participants. < 130/63mm HG in hypertensive participants with diabetes, heart failure or chronic kidney disease.;Long Term: Maintenance of blood pressure at goal levels.    Lipids Yes    Intervention Provide education and support for participant on nutrition & aerobic/resistive exercise along with prescribed medications to achieve LDL 70mg , HDL >40mg .    Expected Outcomes Short Term: Participant states understanding of desired cholesterol values and is compliant with medications prescribed. Participant is following exercise prescription and nutrition guidelines.;Long Term: Cholesterol controlled with medications as prescribed, with individualized exercise RX and with personalized nutrition plan. Value goals: LDL < 70mg , HDL > 40 mg.          Education:Diabetes - Individual verbal and written instruction to review signs/symptoms of diabetes, desired ranges of glucose level fasting, after meals and with exercise. Acknowledge that pre and post exercise glucose checks will be  done for 3 sessions at entry of program.   Core Components/Risk Factors/Patient Goals Review:    Core Components/Risk Factors/Patient Goals at Discharge (Final Review):    ITP Comments:  ITP  Comments     Row Name 04/18/24 1442 04/22/24 1523 04/24/24 1149       ITP Comments Completed program orientation and . Initial ITP created and sent for review to Medical Director. Talia is a current tobacco user. Intervention for tobacco cessation was provided at the initial medical review. She was asked about readiness to quit and reported that she has decreased use to 5-8 cigarettes per day . Patient was advised and educated about tobacco cessation using combination therapy, tobacco cessation classes, quit line, and quit smoking apps. Patient demonstrated understanding of this material. Staff will continue to provide encouragement and follow up with the patient throughout the program. First full day of exercise!  Patient was oriented to gym and equipment including functions, settings, policies, and procedures.  Patient's individual exercise prescription and treatment plan were reviewed.  All starting workloads were established based on the results of the 6 minute walk test done at initial orientation visit.  The plan for exercise progression was also introduced and progression will be customized based on patient's performance and goals. 30 Day review completed. Medical Director ITP review done, changes made as directed, and signed approval by Medical Director. New to program        Comments: 30 day review      [1]  Current Outpatient Medications:    amLODipine  (NORVASC ) 5 MG tablet, Take 0.5 tablets (2.5 mg total) by mouth in the morning and at bedtime., Disp: 90 tablet, Rfl: 3   aspirin  81 MG tablet, Take 2 tablets by mouth daily., Disp: , Rfl:    carvedilol  (COREG ) 6.25 MG tablet, Take by mouth., Disp: , Rfl:    cholecalciferol  (VITAMIN D3) 25 MCG (1000 UNIT) tablet, Take 500 Units by mouth daily., Disp: , Rfl:    clobetasol cream (TEMOVATE) 0.05 %, Apply 1 Application topically 3 (three) times daily as needed., Disp: , Rfl:    clopidogrel  (PLAVIX ) 75 MG tablet, Take 1 tablet (75 mg  total) by mouth daily with breakfast., Disp: 30 tablet, Rfl: 0   estradiol  (ESTRACE ) 0.01 % CREA vaginal cream, Apply one pea-sized amount around the opening of the urethra daily for 2 weeks, then 3 times weekly moving forward., Disp: 43 each, Rfl: 12   ezetimibe  (ZETIA ) 10 MG tablet, Take 1 tablet (10 mg total) by mouth daily., Disp: 30 tablet, Rfl: 0   Multiple Vitamin (MULTIVITAMIN) tablet, Take 1 tablet by mouth daily., Disp: , Rfl:    nitroGLYCERIN  (NITROSTAT ) 0.4 MG SL tablet, Place 1 tablet (0.4 mg total) under the tongue every 5 (five) minutes x 3 doses as needed for chest pain., Disp: 30 tablet, Rfl: 0   potassium chloride  (MICRO-K ) 10 MEQ CR capsule, Take 10 mEq by mouth daily., Disp: , Rfl:    REPATHA  SURECLICK 140 MG/ML SOAJ, ADMINISTER 1 ML UNDER THE SKIN EVERY 14 DAYS, Disp: 6 mL, Rfl: 3 [2]  Social History Tobacco Use  Smoking Status Every Day   Current packs/day: 0.50   Average packs/day: 0.5 packs/day for 55.0 years (27.5 ttl pk-yrs)   Types: Cigarettes  Smokeless Tobacco Never  Tobacco Comments   5 cigarettes a day working toward cessation

## 2024-04-25 ENCOUNTER — Encounter

## 2024-04-25 ENCOUNTER — Encounter: Admitting: *Deleted

## 2024-04-25 DIAGNOSIS — I214 Non-ST elevation (NSTEMI) myocardial infarction: Secondary | ICD-10-CM | POA: Diagnosis not present

## 2024-04-25 NOTE — Progress Notes (Signed)
 Daily Session Note  Patient Details  Name: Tiffany Munoz MRN: [id number] Date of Birth: 1945/07/13 Referring Provider:   Flowsheet Row Cardiac Rehab from 04/18/2024 in [facility] Cardiac and Pulmonary Rehab  Referring Provider Darron Grass, MD    Encounter Date: 04/25/2024  Check In:  Session Check In - 04/25/24 1545       Check-In   Supervising physician immediately available to respond to emergencies See telemetry face sheet for immediately available ER MD    Location [facility]-Cardiac & Pulmonary Rehab    Staff Present Othel Durand, RN, BSN, CCRP;Laureen Delores, BS, RRT, CPFT;Joseph [employer], BS, Exercise Physiologist    Virtual Visit No    Medication changes reported     No    Fall or balance concerns reported    No    Warm-up and Cool-down Performed on first and last piece of equipment    Resistance Training Performed Yes    VAD Patient? No    PAD/SET Patient? No      Pain Assessment   Currently in Pain? No/denies             Tobacco Use History[1]  Goals Met:  Independence with exercise equipment Exercise tolerated well No report of concerns or symptoms today  Goals Unmet:  Not Applicable  Comments: Pt able to follow exercise prescription today without complaint.  Will continue to monitor for progression.    Dr. Oneil Pinal is Medical Director for [facility] Cardiac Rehabilitation.  Dr. Fuad Aleskerov is Medical Director for [facility] Pulmonary Rehabilitation.    [1]  Social History Tobacco Use  Smoking Status Every Day   Current packs/day: 0.50   Average packs/day: 0.5 packs/day for 55.0 years (27.5 ttl pk-yrs)   Types: Cigarettes  Smokeless Tobacco Never  Tobacco Comments   5 cigarettes a day working toward cessation

## 2024-04-29 ENCOUNTER — Encounter

## 2024-05-01 ENCOUNTER — Encounter

## 2024-05-01 DIAGNOSIS — I214 Non-ST elevation (NSTEMI) myocardial infarction: Secondary | ICD-10-CM | POA: Diagnosis not present

## 2024-05-01 NOTE — Progress Notes (Signed)
 Daily Session Note  Patient Details  Name: Senta Kantor MRN: [id number] Date of Birth: 1946-02-24 Referring Provider:   Flowsheet Row Cardiac Rehab from 04/18/2024 in [facility] Cardiac and Pulmonary Rehab  Referring Provider Darron Grass, MD    Encounter Date: 05/01/2024  Check In:  Session Check In - 05/01/24 1553       Check-In   Supervising physician immediately available to respond to emergencies See telemetry face sheet for immediately available ER MD    Location [facility]-Cardiac & Pulmonary Rehab    Staff Present Burnard Davenport RN,BSN,MPA;Meredith Tressa RN,BSN;Joseph [facility] BS, ACSM CEP, Exercise Physiologist    Virtual Visit No    Medication changes reported     No    Fall or balance concerns reported    No    Tobacco Cessation No Change    Warm-up and Cool-down Performed on first and last piece of equipment    Resistance Training Performed Yes    VAD Patient? No    PAD/SET Patient? No      Pain Assessment   Currently in Pain? No/denies             Tobacco Use History[1]  Goals Met:  Independence with exercise equipment Exercise tolerated well No report of concerns or symptoms today Strength training completed today  Goals Unmet:  Not Applicable  Comments: Pt able to follow exercise prescription today without complaint.  Will continue to monitor for progression.    Dr. Oneil Pinal is Medical Director for [facility] Cardiac Rehabilitation.  Dr. Fuad Aleskerov is Medical Director for [facility] Pulmonary Rehabilitation.    [1]  Social History Tobacco Use  Smoking Status Every Day   Current packs/day: 0.50   Average packs/day: 0.5 packs/day for 55.0 years (27.5 ttl pk-yrs)   Types: Cigarettes  Smokeless Tobacco Never  Tobacco Comments   5 cigarettes a day working toward cessation

## 2024-05-02 ENCOUNTER — Encounter

## 2024-05-02 ENCOUNTER — Encounter: Admitting: *Deleted

## 2024-05-02 DIAGNOSIS — I214 Non-ST elevation (NSTEMI) myocardial infarction: Secondary | ICD-10-CM | POA: Diagnosis not present

## 2024-05-02 NOTE — Progress Notes (Signed)
 Daily Session Note  Patient Details  Name: Tiffany Munoz MRN: [id number] Date of Birth: Jan 07, 1946 Referring Provider:   Flowsheet Row Cardiac Rehab from 04/18/2024 in [facility] Cardiac and Pulmonary Rehab  Referring Provider Darron Grass, MD    Encounter Date: 05/02/2024  Check In:  Session Check In - 05/02/24 1543       Check-In   Supervising physician immediately available to respond to emergencies See telemetry face sheet for immediately available ER MD    Location [facility]-Cardiac & Pulmonary Rehab    Staff Present Hoy Rodney RN,BSN;Joseph [facility] RCP,RRT,BSRT;Margaret Best, MS, Exercise Physiologist;Noah Tickle, BS, Exercise Physiologist    Virtual Visit No    Medication changes reported     No    Fall or balance concerns reported    No    Warm-up and Cool-down Performed on first and last piece of equipment    Resistance Training Performed Yes    VAD Patient? No    PAD/SET Patient? No      Pain Assessment   Currently in Pain? No/denies             Tobacco Use History[1]  Goals Met:  Independence with exercise equipment Exercise tolerated well No report of concerns or symptoms today Strength training completed today  Goals Unmet:  Not Applicable  Comments: Pt able to follow exercise prescription today without complaint.  Will continue to monitor for progression.    Dr. Oneil Pinal is Medical Director for [facility] Cardiac Rehabilitation.  Dr. Fuad Aleskerov is Medical Director for [facility] Pulmonary Rehabilitation.    [1]  Social History Tobacco Use  Smoking Status Every Day   Current packs/day: 0.50   Average packs/day: 0.5 packs/day for 55.0 years (27.5 ttl pk-yrs)   Types: Cigarettes  Smokeless Tobacco Never  Tobacco Comments   5 cigarettes a day working toward cessation

## 2024-05-06 ENCOUNTER — Encounter

## 2024-05-08 ENCOUNTER — Encounter: Admitting: Emergency Medicine

## 2024-05-08 DIAGNOSIS — I214 Non-ST elevation (NSTEMI) myocardial infarction: Secondary | ICD-10-CM

## 2024-05-08 NOTE — Progress Notes (Signed)
 Daily Session Note  Patient Details  Name: Tiffany Munoz MRN: [id number] Date of Birth: May 06, 1945 Referring Provider:   Flowsheet Row Cardiac Rehab from 04/18/2024 in [facility] Cardiac and Pulmonary Rehab  Referring Provider Darron Grass, MD    Encounter Date: 05/08/2024  Check In:  Session Check In - 05/08/24 1529       Check-In   Supervising physician immediately available to respond to emergencies See telemetry face sheet for immediately available ER MD    Location [facility]-Cardiac & Pulmonary Rehab    Staff Present Leita Franks RN,BSN;Kelly Bollinger RN,BSN,MPA;Meredith Tressa RN,BSN;Kelly Dyane BS, ACSM CEP, Exercise Physiologist    Virtual Visit No    Medication changes reported     No    Fall or balance concerns reported    No    Tobacco Cessation No Change    Warm-up and Cool-down Performed on first and last piece of equipment    Resistance Training Performed Yes    VAD Patient? No    PAD/SET Patient? No      Pain Assessment   Currently in Pain? No/denies             Tobacco Use History[1]  Goals Met:  Independence with exercise equipment Exercise tolerated well No report of concerns or symptoms today Strength training completed today  Goals Unmet:  Not Applicable  Comments: Pt able to follow exercise prescription today without complaint.  Will continue to monitor for progression.    Dr. Oneil Pinal is Medical Director for [facility] Cardiac Rehabilitation.  Dr. Fuad Aleskerov is Medical Director for [facility] Pulmonary Rehabilitation.    [1]  Social History Tobacco Use  Smoking Status Every Day   Current packs/day: 0.50   Average packs/day: 0.5 packs/day for 55.0 years (27.5 ttl pk-yrs)   Types: Cigarettes  Smokeless Tobacco Never  Tobacco Comments   5 cigarettes a day working toward cessation

## 2024-05-09 ENCOUNTER — Encounter

## 2024-05-09 ENCOUNTER — Encounter: Admitting: *Deleted

## 2024-05-09 DIAGNOSIS — I214 Non-ST elevation (NSTEMI) myocardial infarction: Secondary | ICD-10-CM | POA: Diagnosis not present

## 2024-05-09 NOTE — Progress Notes (Signed)
 Daily Session Note  Patient Details  Name: Tiffany Munoz MRN: [id number] Date of Birth: 1945-08-24 Referring Provider:   Flowsheet Row Cardiac Rehab from 04/18/2024 in [facility] Cardiac and Pulmonary Rehab  Referring Provider Darron Grass, MD    Encounter Date: 05/09/2024  Check In:  Session Check In - 05/09/24 1524       Check-In   Supervising physician immediately available to respond to emergencies See telemetry face sheet for immediately available ER MD    Location [facility]-Cardiac & Pulmonary Rehab    Staff Present Hoy Rodney RN,BSN;Joseph [facility] BS, Exercise Physiologist;Noah Tickle, BS, Exercise Physiologist    Virtual Visit No    Medication changes reported     No    Fall or balance concerns reported    No    Tobacco Cessation No Change    Current number of cigarettes/nicotine per day     5    Warm-up and Cool-down Performed on first and last piece of equipment    Resistance Training Performed Yes    VAD Patient? No    PAD/SET Patient? No      Pain Assessment   Currently in Pain? No/denies             Tobacco Use History[1]  Goals Met:  Independence with exercise equipment Exercise tolerated well No report of concerns or symptoms today Strength training completed today  Goals Unmet:  Not Applicable  Comments: Pt able to follow exercise prescription today without complaint.  Will continue to monitor for progression.    Dr. Oneil Pinal is Medical Director for [facility] Cardiac Rehabilitation.  Dr. Fuad Aleskerov is Medical Director for [facility] Pulmonary Rehabilitation.    [1]  Social History Tobacco Use  Smoking Status Every Day   Current packs/day: 0.50   Average packs/day: 0.5 packs/day for 55.0 years (27.5 ttl pk-yrs)   Types: Cigarettes  Smokeless Tobacco Never  Tobacco Comments   5 cigarettes a day working toward cessation

## 2024-05-13 ENCOUNTER — Encounter

## 2024-05-15 ENCOUNTER — Encounter

## 2024-05-15 DIAGNOSIS — I214 Non-ST elevation (NSTEMI) myocardial infarction: Secondary | ICD-10-CM

## 2024-05-15 NOTE — Progress Notes (Signed)
 Daily Session Note  Patient Details  Name: Tiffany Munoz MRN: [id number] Date of Birth: 1946-03-04 Referring Provider:   Flowsheet Row Cardiac Rehab from 04/18/2024 in [facility] Cardiac and Pulmonary Rehab  Referring Provider Darron Grass, MD    Encounter Date: 05/15/2024  Check In:  Session Check In - 05/15/24 1536       Check-In   Supervising physician immediately available to respond to emergencies See telemetry face sheet for immediately available ER MD    Location [facility]-Cardiac & Pulmonary Rehab    Staff Present Burnard Davenport RN,BSN,MPA;Joseph Rolinda RCP,RRT,BSRT;Meredith Tressa RN,BSN;Kiahna Banghart Dyane BS, ACSM CEP, Exercise Physiologist    Virtual Visit No    Medication changes reported     No    Fall or balance concerns reported    No    Tobacco Cessation No Change    Warm-up and Cool-down Performed on first and last piece of equipment    Resistance Training Performed Yes    VAD Patient? No    PAD/SET Patient? No      Pain Assessment   Currently in Pain? No/denies             Tobacco Use History[1]  Goals Met:  Independence with exercise equipment Exercise tolerated well No report of concerns or symptoms today Strength training completed today  Goals Unmet:  Not Applicable  Comments: Pt able to follow exercise prescription today without complaint.  Will continue to monitor for progression.    Dr. Oneil Pinal is Medical Director for [facility] Cardiac Rehabilitation.  Dr. Fuad Aleskerov is Medical Director for [facility] Pulmonary Rehabilitation.    [1]  Social History Tobacco Use  Smoking Status Every Day   Current packs/day: 0.50   Average packs/day: 0.5 packs/day for 55.0 years (27.5 ttl pk-yrs)   Types: Cigarettes  Smokeless Tobacco Never  Tobacco Comments   5 cigarettes a day working toward cessation

## 2024-05-16 ENCOUNTER — Encounter: Admitting: *Deleted

## 2024-05-16 ENCOUNTER — Encounter

## 2024-05-16 DIAGNOSIS — I214 Non-ST elevation (NSTEMI) myocardial infarction: Secondary | ICD-10-CM

## 2024-05-16 NOTE — Progress Notes (Signed)
 Daily Session Note  Patient Details  Name: Tiffany Munoz MRN: [id number] Date of Birth: 22-May-1945 Referring Provider:   Flowsheet Row Cardiac Rehab from 04/18/2024 in [facility] Cardiac and Pulmonary Rehab  Referring Provider Darron Grass, MD    Encounter Date: 05/16/2024  Check In:  Session Check In - 05/16/24 1534       Check-In   Supervising physician immediately available to respond to emergencies See telemetry face sheet for immediately available ER MD    Location [facility]-Cardiac & Pulmonary Rehab    Staff Present Hoy Rodney RN,BSN;Joseph [address] [place], [place], Exercise Physiologist;Laura Cates RN,BSN    Virtual Visit No    Medication changes reported     No    Fall or balance concerns reported    No    Tobacco Cessation No Change    Current number of cigarettes/nicotine per day     5    Warm-up and Cool-down Performed on first and last piece of equipment    Resistance Training Performed Yes    VAD Patient? No    PAD/SET Patient? No      Pain Assessment   Currently in Pain? No/denies             Tobacco Use History[1]  Goals Met:  Independence with exercise equipment Exercise tolerated well No report of concerns or symptoms today Strength training completed today  Goals Unmet:  Not Applicable  Comments: Pt able to follow exercise prescription today without complaint.  Will continue to monitor for progression.    Dr. Oneil Pinal is Medical Director for [facility] Cardiac Rehabilitation.  Dr. Fuad Aleskerov is Medical Director for [facility] Pulmonary Rehabilitation.    [1]  Social History Tobacco Use  Smoking Status Every Day   Current packs/day: 0.50   Average packs/day: 0.5 packs/day for 55.0 years (27.5 ttl pk-yrs)   Types: Cigarettes  Smokeless Tobacco Never  Tobacco Comments   5 cigarettes a day working toward cessation

## 2024-05-20 ENCOUNTER — Encounter

## 2024-05-22 ENCOUNTER — Encounter

## 2024-05-23 ENCOUNTER — Encounter

## 2024-05-27 ENCOUNTER — Encounter

## 2024-05-29 ENCOUNTER — Encounter

## 2024-05-30 ENCOUNTER — Encounter

## 2024-06-03 ENCOUNTER — Encounter

## 2024-06-05 ENCOUNTER — Encounter

## 2024-06-06 ENCOUNTER — Encounter

## 2024-06-10 ENCOUNTER — Encounter

## 2024-06-12 ENCOUNTER — Encounter

## 2024-06-13 ENCOUNTER — Encounter

## 2024-06-17 ENCOUNTER — Encounter

## 2024-06-19 ENCOUNTER — Ambulatory Visit

## 2024-06-19 ENCOUNTER — Encounter

## 2024-06-20 ENCOUNTER — Encounter

## 2024-06-24 ENCOUNTER — Encounter

## 2024-06-26 ENCOUNTER — Encounter

## 2024-06-27 ENCOUNTER — Encounter

## 2024-07-01 ENCOUNTER — Encounter

## 2024-07-03 ENCOUNTER — Encounter

## 2024-07-04 ENCOUNTER — Encounter

## 2024-07-08 ENCOUNTER — Encounter

## 2024-07-09 ENCOUNTER — Ambulatory Visit: Admitting: Medical

## 2024-07-10 ENCOUNTER — Encounter

## 2024-07-11 ENCOUNTER — Encounter

## 2024-07-15 ENCOUNTER — Encounter

## 2024-07-17 ENCOUNTER — Encounter

## 2024-07-18 ENCOUNTER — Encounter

## 2024-07-22 ENCOUNTER — Encounter

## 2024-07-24 ENCOUNTER — Encounter

## 2024-07-25 ENCOUNTER — Encounter

## 2024-07-29 ENCOUNTER — Encounter

## 2024-07-31 ENCOUNTER — Encounter

## 2024-08-01 ENCOUNTER — Encounter

## 2024-08-05 ENCOUNTER — Encounter

## 2024-08-07 ENCOUNTER — Encounter

## 2024-08-08 ENCOUNTER — Encounter

## 2024-08-12 ENCOUNTER — Encounter

## 2024-08-14 ENCOUNTER — Encounter

## 2024-08-15 ENCOUNTER — Encounter
